# Patient Record
Sex: Female | Born: 1960 | Race: Black or African American | Hispanic: No | Smoking: Former smoker
Health system: Southern US, Community
[De-identification: ages and names within clinical notes are randomized; demographics above are authoritative.]

## PROBLEM LIST (undated history)

## (undated) DIAGNOSIS — N760 Acute vaginitis: Secondary | ICD-10-CM

## (undated) DIAGNOSIS — F32A Depression, unspecified: Secondary | ICD-10-CM

## (undated) DIAGNOSIS — K5900 Constipation, unspecified: Secondary | ICD-10-CM

## (undated) DIAGNOSIS — F419 Anxiety disorder, unspecified: Secondary | ICD-10-CM

## (undated) DIAGNOSIS — K219 Gastro-esophageal reflux disease without esophagitis: Secondary | ICD-10-CM

## (undated) DIAGNOSIS — M797 Fibromyalgia: Secondary | ICD-10-CM

## (undated) DIAGNOSIS — F329 Major depressive disorder, single episode, unspecified: Secondary | ICD-10-CM

## (undated) DIAGNOSIS — I10 Essential (primary) hypertension: Secondary | ICD-10-CM

## (undated) DIAGNOSIS — R011 Cardiac murmur, unspecified: Secondary | ICD-10-CM

## (undated) DIAGNOSIS — B369 Superficial mycosis, unspecified: Secondary | ICD-10-CM

## (undated) DIAGNOSIS — D649 Anemia, unspecified: Secondary | ICD-10-CM

## (undated) DIAGNOSIS — R102 Pelvic and perineal pain: Secondary | ICD-10-CM

## (undated) DIAGNOSIS — M1990 Unspecified osteoarthritis, unspecified site: Secondary | ICD-10-CM

## (undated) DIAGNOSIS — B9689 Other specified bacterial agents as the cause of diseases classified elsewhere: Secondary | ICD-10-CM

## (undated) DIAGNOSIS — N898 Other specified noninflammatory disorders of vagina: Principal | ICD-10-CM

## (undated) HISTORY — DX: Pelvic and perineal pain: R10.2

## (undated) HISTORY — DX: Other specified bacterial agents as the cause of diseases classified elsewhere: B96.89

## (undated) HISTORY — DX: Constipation, unspecified: K59.00

## (undated) HISTORY — DX: Other specified noninflammatory disorders of vagina: N89.8

## (undated) HISTORY — DX: Anemia, unspecified: D64.9

## (undated) HISTORY — DX: Superficial mycosis, unspecified: B36.9

## (undated) HISTORY — PX: TRIGGER FINGER RELEASE: SHX641

## (undated) HISTORY — DX: Fibromyalgia: M79.7

## (undated) HISTORY — DX: Acute vaginitis: N76.0

---

## 1994-07-23 HISTORY — PX: CYSTECTOMY: SUR359

## 1997-07-23 HISTORY — PX: KNEE SURGERY: SHX244

## 2005-12-08 ENCOUNTER — Emergency Department (HOSPITAL_COMMUNITY): Admission: EM | Admit: 2005-12-08 | Discharge: 2005-12-09 | Payer: Self-pay | Admitting: Emergency Medicine

## 2006-01-28 ENCOUNTER — Ambulatory Visit (HOSPITAL_COMMUNITY): Admission: RE | Admit: 2006-01-28 | Discharge: 2006-01-28 | Payer: Self-pay | Admitting: Family Medicine

## 2006-03-19 ENCOUNTER — Ambulatory Visit (HOSPITAL_COMMUNITY): Admission: RE | Admit: 2006-03-19 | Discharge: 2006-03-19 | Payer: Self-pay | Admitting: Family Medicine

## 2006-07-23 HISTORY — PX: ENDOMETRIAL ABLATION: SHX621

## 2006-08-17 ENCOUNTER — Emergency Department (HOSPITAL_COMMUNITY): Admission: EM | Admit: 2006-08-17 | Discharge: 2006-08-17 | Payer: Self-pay | Admitting: Emergency Medicine

## 2006-09-12 ENCOUNTER — Emergency Department (HOSPITAL_COMMUNITY): Admission: EM | Admit: 2006-09-12 | Discharge: 2006-09-12 | Payer: Self-pay | Admitting: Emergency Medicine

## 2006-10-03 ENCOUNTER — Ambulatory Visit (HOSPITAL_COMMUNITY): Admission: RE | Admit: 2006-10-03 | Discharge: 2006-10-03 | Payer: Self-pay | Admitting: Family Medicine

## 2006-10-05 ENCOUNTER — Emergency Department (HOSPITAL_COMMUNITY): Admission: EM | Admit: 2006-10-05 | Discharge: 2006-10-05 | Payer: Self-pay | Admitting: Emergency Medicine

## 2006-10-14 ENCOUNTER — Ambulatory Visit (HOSPITAL_COMMUNITY): Admission: RE | Admit: 2006-10-14 | Discharge: 2006-10-14 | Payer: Self-pay | Admitting: Obstetrics and Gynecology

## 2006-11-14 ENCOUNTER — Ambulatory Visit (HOSPITAL_COMMUNITY): Admission: RE | Admit: 2006-11-14 | Discharge: 2006-11-14 | Payer: Self-pay | Admitting: Obstetrics and Gynecology

## 2006-11-14 ENCOUNTER — Encounter (INDEPENDENT_AMBULATORY_CARE_PROVIDER_SITE_OTHER): Payer: Self-pay | Admitting: Specialist

## 2007-03-04 ENCOUNTER — Emergency Department (HOSPITAL_COMMUNITY): Admission: EM | Admit: 2007-03-04 | Discharge: 2007-03-04 | Payer: Self-pay | Admitting: Emergency Medicine

## 2007-03-25 ENCOUNTER — Other Ambulatory Visit: Admission: RE | Admit: 2007-03-25 | Discharge: 2007-03-25 | Payer: Self-pay | Admitting: Obstetrics and Gynecology

## 2007-03-26 ENCOUNTER — Encounter (HOSPITAL_COMMUNITY): Admission: RE | Admit: 2007-03-26 | Discharge: 2007-04-22 | Payer: Self-pay | Admitting: Orthopedic Surgery

## 2007-04-21 ENCOUNTER — Ambulatory Visit (HOSPITAL_COMMUNITY): Admission: RE | Admit: 2007-04-21 | Discharge: 2007-04-21 | Payer: Self-pay | Admitting: Family Medicine

## 2007-04-21 ENCOUNTER — Encounter (HOSPITAL_COMMUNITY)
Admission: RE | Admit: 2007-04-21 | Discharge: 2007-04-22 | Payer: Self-pay | Admitting: Physical Medicine and Rehabilitation

## 2007-04-25 ENCOUNTER — Encounter (HOSPITAL_COMMUNITY): Admission: RE | Admit: 2007-04-25 | Discharge: 2007-05-25 | Payer: Self-pay | Admitting: Family Medicine

## 2007-04-25 ENCOUNTER — Encounter (HOSPITAL_COMMUNITY)
Admission: RE | Admit: 2007-04-25 | Discharge: 2007-05-25 | Payer: Self-pay | Admitting: Physical Medicine and Rehabilitation

## 2007-05-05 ENCOUNTER — Emergency Department (HOSPITAL_COMMUNITY): Admission: EM | Admit: 2007-05-05 | Discharge: 2007-05-05 | Payer: Self-pay | Admitting: Emergency Medicine

## 2007-05-22 ENCOUNTER — Ambulatory Visit: Payer: Self-pay | Admitting: Physical Medicine & Rehabilitation

## 2007-05-27 ENCOUNTER — Encounter (HOSPITAL_COMMUNITY): Admission: RE | Admit: 2007-05-27 | Discharge: 2007-06-26 | Payer: Self-pay | Admitting: Family Medicine

## 2007-05-27 ENCOUNTER — Encounter (HOSPITAL_COMMUNITY)
Admission: RE | Admit: 2007-05-27 | Discharge: 2007-06-26 | Payer: Self-pay | Admitting: Physical Medicine and Rehabilitation

## 2007-05-30 ENCOUNTER — Ambulatory Visit (HOSPITAL_COMMUNITY): Admission: RE | Admit: 2007-05-30 | Discharge: 2007-05-30 | Payer: Self-pay | Admitting: Family Medicine

## 2007-06-11 ENCOUNTER — Ambulatory Visit (HOSPITAL_COMMUNITY): Admission: RE | Admit: 2007-06-11 | Discharge: 2007-06-11 | Payer: Self-pay | Admitting: Family Medicine

## 2007-07-03 ENCOUNTER — Encounter (HOSPITAL_COMMUNITY)
Admission: RE | Admit: 2007-07-03 | Discharge: 2007-07-23 | Payer: Self-pay | Admitting: Physical Medicine and Rehabilitation

## 2007-07-25 ENCOUNTER — Encounter (HOSPITAL_COMMUNITY)
Admission: RE | Admit: 2007-07-25 | Discharge: 2007-08-24 | Payer: Self-pay | Admitting: Physical Medicine and Rehabilitation

## 2007-10-07 ENCOUNTER — Emergency Department (HOSPITAL_COMMUNITY): Admission: EM | Admit: 2007-10-07 | Discharge: 2007-10-07 | Payer: Self-pay | Admitting: Emergency Medicine

## 2007-10-09 ENCOUNTER — Encounter (HOSPITAL_COMMUNITY): Admission: RE | Admit: 2007-10-09 | Discharge: 2007-11-08 | Payer: Self-pay | Admitting: Orthopedic Surgery

## 2007-10-09 ENCOUNTER — Ambulatory Visit: Payer: Self-pay | Admitting: Physical Medicine & Rehabilitation

## 2007-11-10 ENCOUNTER — Encounter (HOSPITAL_COMMUNITY): Admission: RE | Admit: 2007-11-10 | Discharge: 2007-12-10 | Payer: Self-pay | Admitting: Orthopedic Surgery

## 2007-12-11 ENCOUNTER — Encounter (HOSPITAL_COMMUNITY): Admission: RE | Admit: 2007-12-11 | Discharge: 2008-01-10 | Payer: Self-pay | Admitting: Orthopedic Surgery

## 2008-01-14 ENCOUNTER — Encounter (HOSPITAL_COMMUNITY): Admission: RE | Admit: 2008-01-14 | Discharge: 2008-02-13 | Payer: Self-pay | Admitting: Orthopedic Surgery

## 2008-04-29 ENCOUNTER — Other Ambulatory Visit: Admission: RE | Admit: 2008-04-29 | Discharge: 2008-04-29 | Payer: Self-pay | Admitting: Obstetrics & Gynecology

## 2008-07-23 HISTORY — PX: SHOULDER SURGERY: SHX246

## 2008-08-18 ENCOUNTER — Ambulatory Visit (HOSPITAL_COMMUNITY): Admission: RE | Admit: 2008-08-18 | Discharge: 2008-08-18 | Payer: Self-pay | Admitting: Family Medicine

## 2008-09-02 ENCOUNTER — Ambulatory Visit: Payer: Self-pay | Admitting: Cardiology

## 2008-09-03 ENCOUNTER — Observation Stay (HOSPITAL_COMMUNITY): Admission: EM | Admit: 2008-09-03 | Discharge: 2008-09-03 | Payer: Self-pay | Admitting: Cardiovascular Disease

## 2008-09-30 ENCOUNTER — Ambulatory Visit (HOSPITAL_COMMUNITY): Admission: RE | Admit: 2008-09-30 | Discharge: 2008-09-30 | Payer: Self-pay | Admitting: Family Medicine

## 2009-03-09 ENCOUNTER — Emergency Department (HOSPITAL_COMMUNITY): Admission: EM | Admit: 2009-03-09 | Discharge: 2009-03-09 | Payer: Self-pay | Admitting: Emergency Medicine

## 2009-05-08 ENCOUNTER — Emergency Department (HOSPITAL_COMMUNITY): Admission: EM | Admit: 2009-05-08 | Discharge: 2009-05-08 | Payer: Self-pay | Admitting: Emergency Medicine

## 2009-08-19 ENCOUNTER — Ambulatory Visit (HOSPITAL_COMMUNITY): Admission: RE | Admit: 2009-08-19 | Discharge: 2009-08-19 | Payer: Self-pay | Admitting: Family Medicine

## 2010-01-11 ENCOUNTER — Emergency Department (HOSPITAL_COMMUNITY): Admission: EM | Admit: 2010-01-11 | Discharge: 2010-01-11 | Payer: Self-pay | Admitting: Emergency Medicine

## 2010-06-04 ENCOUNTER — Emergency Department (HOSPITAL_COMMUNITY): Admission: EM | Admit: 2010-06-04 | Discharge: 2010-06-04 | Payer: Self-pay | Admitting: Emergency Medicine

## 2010-08-13 ENCOUNTER — Encounter: Payer: Self-pay | Admitting: Family Medicine

## 2010-09-03 ENCOUNTER — Emergency Department (HOSPITAL_COMMUNITY)
Admission: EM | Admit: 2010-09-03 | Discharge: 2010-09-03 | Disposition: A | Discharge status: Home / Self Care | Payer: Medicaid Other | Attending: Emergency Medicine | Admitting: Emergency Medicine

## 2010-09-03 ENCOUNTER — Emergency Department (HOSPITAL_COMMUNITY): Payer: Medicaid Other

## 2010-09-03 DIAGNOSIS — I10 Essential (primary) hypertension: Secondary | ICD-10-CM | POA: Insufficient documentation

## 2010-09-03 DIAGNOSIS — E785 Hyperlipidemia, unspecified: Secondary | ICD-10-CM | POA: Insufficient documentation

## 2010-09-03 DIAGNOSIS — IMO0002 Reserved for concepts with insufficient information to code with codable children: Secondary | ICD-10-CM | POA: Insufficient documentation

## 2010-09-03 DIAGNOSIS — M25519 Pain in unspecified shoulder: Secondary | ICD-10-CM | POA: Insufficient documentation

## 2010-09-03 DIAGNOSIS — E119 Type 2 diabetes mellitus without complications: Secondary | ICD-10-CM | POA: Insufficient documentation

## 2010-09-03 DIAGNOSIS — M79609 Pain in unspecified limb: Secondary | ICD-10-CM | POA: Insufficient documentation

## 2010-09-03 DIAGNOSIS — Z9889 Other specified postprocedural states: Secondary | ICD-10-CM | POA: Insufficient documentation

## 2010-09-18 ENCOUNTER — Emergency Department (HOSPITAL_COMMUNITY)
Admission: EM | Admit: 2010-09-18 | Discharge: 2010-09-19 | Disposition: A | Discharge status: Home / Self Care | Payer: Medicaid Other | Attending: Emergency Medicine | Admitting: Emergency Medicine

## 2010-09-18 DIAGNOSIS — E119 Type 2 diabetes mellitus without complications: Secondary | ICD-10-CM | POA: Insufficient documentation

## 2010-09-18 DIAGNOSIS — G8929 Other chronic pain: Secondary | ICD-10-CM | POA: Insufficient documentation

## 2010-09-18 DIAGNOSIS — Z794 Long term (current) use of insulin: Secondary | ICD-10-CM | POA: Insufficient documentation

## 2010-09-18 DIAGNOSIS — Z79899 Other long term (current) drug therapy: Secondary | ICD-10-CM | POA: Insufficient documentation

## 2010-09-18 DIAGNOSIS — I10 Essential (primary) hypertension: Secondary | ICD-10-CM | POA: Insufficient documentation

## 2010-09-18 DIAGNOSIS — M25519 Pain in unspecified shoulder: Secondary | ICD-10-CM | POA: Insufficient documentation

## 2010-09-20 ENCOUNTER — Other Ambulatory Visit (HOSPITAL_COMMUNITY): Payer: Self-pay | Admitting: Family Medicine

## 2010-09-20 DIAGNOSIS — Z139 Encounter for screening, unspecified: Secondary | ICD-10-CM

## 2010-09-29 ENCOUNTER — Ambulatory Visit (HOSPITAL_COMMUNITY)
Admission: RE | Admit: 2010-09-29 | Discharge: 2010-09-29 | Disposition: A | Discharge status: Home / Self Care | Payer: Medicaid Other | Source: Ambulatory Visit | Attending: Family Medicine | Admitting: Family Medicine

## 2010-09-29 DIAGNOSIS — Z1231 Encounter for screening mammogram for malignant neoplasm of breast: Secondary | ICD-10-CM | POA: Insufficient documentation

## 2010-09-29 DIAGNOSIS — Z139 Encounter for screening, unspecified: Secondary | ICD-10-CM

## 2010-10-03 LAB — POCT CARDIAC MARKERS
CKMB, poc: <1 ng/mL — ABNORMAL LOW (ref 1.0–8.0)
Myoglobin, poc: 56.6 ng/mL (ref 12–200)
Troponin i, poc: <0.05 ng/mL (ref 0.00–0.09)

## 2010-10-03 LAB — URINALYSIS, ROUTINE W REFLEX MICROSCOPIC
Bilirubin Urine: NEGATIVE
Nitrite: NEGATIVE
Protein, ur: NEGATIVE mg/dL
Specific Gravity, Urine: 1.015 (ref 1.005–1.030)
Urobilinogen, UA: 0.2 mg/dL (ref 0.0–1.0)

## 2010-10-03 LAB — CBC
MCH: 30.2 pg (ref 26.0–34.0)
MCHC: 33.2 g/dL (ref 30.0–36.0)
MCV: 91.1 fL (ref 78.0–100.0)
Platelets: 368 10*3/uL (ref 150–400)

## 2010-10-03 LAB — COMPREHENSIVE METABOLIC PANEL
AST: 12 U/L (ref 0–37)
Albumin: 3.7 g/dL (ref 3.5–5.2)
BUN: 8 mg/dL (ref 6–23)
CO2: 28 mEq/L (ref 19–32)
Calcium: 9.8 mg/dL (ref 8.4–10.5)
Creatinine, Ser: 0.67 mg/dL (ref 0.4–1.2)
GFR calc Af Amer: >60 mL/min (ref >60)
GFR calc non Af Amer: >60 mL/min (ref >60)
Total Bilirubin: 0.6 mg/dL (ref 0.3–1.2)

## 2010-10-03 LAB — LIPASE, BLOOD: Lipase: 16 U/L (ref 11–59)

## 2010-10-03 LAB — DIFFERENTIAL
Eosinophils Relative: 3 % (ref 0–5)
Lymphocytes Relative: 26 % (ref 12–46)
Lymphs Abs: 1.7 10*3/uL (ref 0.7–4.0)

## 2010-10-08 LAB — RAPID URINE DRUG SCREEN, HOSP PERFORMED
Amphetamines: NOT DETECTED
Barbiturates: NOT DETECTED
Benzodiazepines: POSITIVE — AB
Tetrahydrocannabinol: NOT DETECTED

## 2010-10-08 LAB — GLUCOSE, CAPILLARY
Glucose-Capillary: 161 mg/dL — ABNORMAL HIGH (ref 70–99)
Glucose-Capillary: 199 mg/dL — ABNORMAL HIGH (ref 70–99)
Glucose-Capillary: 263 mg/dL — ABNORMAL HIGH (ref 70–99)
Glucose-Capillary: 79 mg/dL (ref 70–99)

## 2010-10-08 LAB — DIFFERENTIAL
Basophils Relative: 0 % (ref 0–1)
Eosinophils Absolute: 0.2 10*3/uL (ref 0.0–0.7)
Eosinophils Relative: 3 % (ref 0–5)
Lymphs Abs: 1.6 10*3/uL (ref 0.7–4.0)
Monocytes Absolute: 0.5 10*3/uL (ref 0.1–1.0)
Monocytes Relative: 9 % (ref 3–12)
Neutrophils Relative %: 60 % (ref 43–77)

## 2010-10-08 LAB — URINALYSIS, ROUTINE W REFLEX MICROSCOPIC
Glucose, UA: >1000 mg/dL — AB
Leukocytes, UA: NEGATIVE
Nitrite: NEGATIVE
Specific Gravity, Urine: 1.01 (ref 1.005–1.030)
pH: 6 (ref 5.0–8.0)

## 2010-10-08 LAB — COMPREHENSIVE METABOLIC PANEL
ALT: 11 U/L (ref 0–35)
AST: 13 U/L (ref 0–37)
Alkaline Phosphatase: 86 U/L (ref 39–117)
Calcium: 8.9 mg/dL (ref 8.4–10.5)
GFR calc Af Amer: >60 mL/min (ref >60)
Glucose, Bld: 289 mg/dL — ABNORMAL HIGH (ref 70–99)
Potassium: 3.5 mEq/L (ref 3.5–5.1)
Sodium: 136 mEq/L (ref 135–145)
Total Protein: 6.4 g/dL (ref 6.0–8.3)

## 2010-10-08 LAB — CBC
HCT: 36.6 % (ref 36.0–46.0)
Hemoglobin: 12.3 g/dL (ref 12.0–15.0)
MCH: 30.9 pg (ref 26.0–34.0)
MCHC: 33.7 g/dL (ref 30.0–36.0)
RBC: 3.99 MIL/uL (ref 3.87–5.11)

## 2010-10-08 LAB — URINE CULTURE

## 2010-10-08 LAB — URINE MICROSCOPIC-ADD ON

## 2010-10-08 LAB — CK TOTAL AND CKMB (NOT AT ARMC): Total CK: 61 U/L (ref 7–177)

## 2010-10-19 ENCOUNTER — Ambulatory Visit (HOSPITAL_COMMUNITY): Payer: Medicaid Other | Admitting: Specialist

## 2010-10-24 ENCOUNTER — Ambulatory Visit (HOSPITAL_COMMUNITY)
Admission: RE | Admit: 2010-10-24 | Discharge: 2010-10-24 | Disposition: A | Discharge status: Home / Self Care | Payer: Medicaid Other | Source: Ambulatory Visit | Attending: Physical Medicine and Rehabilitation | Admitting: Physical Medicine and Rehabilitation

## 2010-10-24 DIAGNOSIS — M6281 Muscle weakness (generalized): Secondary | ICD-10-CM | POA: Insufficient documentation

## 2010-10-24 DIAGNOSIS — M25519 Pain in unspecified shoulder: Secondary | ICD-10-CM | POA: Insufficient documentation

## 2010-10-24 DIAGNOSIS — IMO0001 Reserved for inherently not codable concepts without codable children: Secondary | ICD-10-CM | POA: Insufficient documentation

## 2010-10-26 LAB — COMPREHENSIVE METABOLIC PANEL WITH GFR
AST: 14 U/L (ref 0–37)
Albumin: 3.7 g/dL (ref 3.5–5.2)
Calcium: 8.6 mg/dL (ref 8.4–10.5)
Chloride: 104 meq/L (ref 96–112)
Creatinine, Ser: 0.59 mg/dL (ref 0.4–1.2)
GFR calc Af Amer: >60 mL/min (ref >60)
Total Bilirubin: 0.6 mg/dL (ref 0.3–1.2)
Total Protein: 6.5 g/dL (ref 6.0–8.3)

## 2010-10-26 LAB — COMPREHENSIVE METABOLIC PANEL
ALT: 13 U/L (ref 0–35)
Alkaline Phosphatase: 76 U/L (ref 39–117)
BUN: 8 mg/dL (ref 6–23)
CO2: 26 mEq/L (ref 19–32)
GFR calc non Af Amer: >60 mL/min (ref >60)
Glucose, Bld: 300 mg/dL — ABNORMAL HIGH (ref 70–99)
Potassium: 4 mEq/L (ref 3.5–5.1)
Sodium: 136 mEq/L (ref 135–145)

## 2010-10-26 LAB — DIFFERENTIAL
Basophils Absolute: 0 10*3/uL (ref 0.0–0.1)
Basophils Relative: 0 % (ref 0–1)
Eosinophils Absolute: 0.1 10*3/uL (ref 0.0–0.7)
Eosinophils Relative: 2 % (ref 0–5)
Lymphocytes Relative: 22 % (ref 12–46)
Lymphs Abs: 1.4 K/uL (ref 0.7–4.0)
Monocytes Absolute: 0.5 K/uL (ref 0.1–1.0)
Monocytes Relative: 7 % (ref 3–12)
Neutro Abs: 4.6 10*3/uL (ref 1.7–7.7)
Neutrophils Relative %: 69 % (ref 43–77)

## 2010-10-26 LAB — CBC
HCT: 35.6 % — ABNORMAL LOW (ref 36.0–46.0)
Hemoglobin: 12.1 g/dL (ref 12.0–15.0)
MCHC: 34.1 g/dL (ref 30.0–36.0)
MCV: 91.7 fL (ref 78.0–100.0)
Platelets: 363 K/uL (ref 150–400)
RBC: 3.88 MIL/uL (ref 3.87–5.11)
RDW: 13.8 % (ref 11.5–15.5)
WBC: 6.7 K/uL (ref 4.0–10.5)

## 2010-10-26 LAB — URINALYSIS, ROUTINE W REFLEX MICROSCOPIC
Bilirubin Urine: NEGATIVE
Hgb urine dipstick: NEGATIVE
Ketones, ur: NEGATIVE mg/dL
Nitrite: NEGATIVE
Specific Gravity, Urine: <1.005 — ABNORMAL LOW (ref 1.005–1.030)
Urobilinogen, UA: 0.2 mg/dL (ref 0.0–1.0)

## 2010-10-26 LAB — GLUCOSE, CAPILLARY
Glucose-Capillary: 200 mg/dL — ABNORMAL HIGH (ref 70–99)
Glucose-Capillary: 317 mg/dL — ABNORMAL HIGH (ref 70–99)

## 2010-10-26 LAB — LIPASE, BLOOD: Lipase: 14 U/L (ref 11–59)

## 2010-10-26 LAB — URINE MICROSCOPIC-ADD ON

## 2010-10-31 ENCOUNTER — Ambulatory Visit (HOSPITAL_COMMUNITY): Payer: Medicaid Other | Admitting: Occupational Therapy

## 2010-11-02 ENCOUNTER — Ambulatory Visit (HOSPITAL_COMMUNITY): Admission: RE | Admit: 2010-11-02 | Payer: Medicaid Other | Source: Ambulatory Visit | Admitting: Specialist

## 2010-11-07 ENCOUNTER — Ambulatory Visit (HOSPITAL_COMMUNITY): Payer: Medicaid Other | Admitting: Occupational Therapy

## 2010-11-07 LAB — CARDIAC PANEL(CRET KIN+CKTOT+MB+TROPI)
Relative Index: INVALID (ref 0.0–2.5)
Troponin I: <0.01 ng/mL (ref 0.00–0.06)

## 2010-11-07 LAB — DIFFERENTIAL
Basophils Absolute: 0 10*3/uL (ref 0.0–0.1)
Lymphocytes Relative: 25 % (ref 12–46)
Monocytes Absolute: 0.4 10*3/uL (ref 0.1–1.0)
Neutro Abs: 4 10*3/uL (ref 1.7–7.7)
Neutrophils Relative %: 65 % (ref 43–77)

## 2010-11-07 LAB — HEPATIC FUNCTION PANEL
ALT: 14 U/L (ref 0–35)
AST: 15 U/L (ref 0–37)
Alkaline Phosphatase: 70 U/L (ref 39–117)
Bilirubin, Direct: 0.1 mg/dL (ref 0.0–0.3)
Indirect Bilirubin: 0.6 mg/dL (ref 0.3–0.9)
Total Bilirubin: 0.7 mg/dL (ref 0.3–1.2)

## 2010-11-07 LAB — POCT CARDIAC MARKERS
CKMB, poc: 1.1 ng/mL (ref 1.0–8.0)
CKMB, poc: <1 ng/mL — ABNORMAL LOW (ref 1.0–8.0)
Myoglobin, poc: 28.6 ng/mL (ref 12–200)
Myoglobin, poc: 29.9 ng/mL (ref 12–200)
Myoglobin, poc: 39.8 ng/mL (ref 12–200)
Troponin i, poc: <0.05 ng/mL (ref 0.00–0.09)

## 2010-11-07 LAB — BASIC METABOLIC PANEL
Calcium: 9 mg/dL (ref 8.4–10.5)
GFR calc Af Amer: >60 mL/min (ref >60)
GFR calc non Af Amer: >60 mL/min (ref >60)
Sodium: 133 mEq/L — ABNORMAL LOW (ref 135–145)

## 2010-11-07 LAB — CBC
Hemoglobin: 12.1 g/dL (ref 12.0–15.0)
RBC: 3.88 MIL/uL (ref 3.87–5.11)
RDW: 12.8 % (ref 11.5–15.5)

## 2010-11-07 LAB — APTT: aPTT: 29 seconds (ref 24–37)

## 2010-11-07 LAB — PROTIME-INR: INR: 0.9 (ref 0.00–1.49)

## 2010-11-07 LAB — GLUCOSE, CAPILLARY
Glucose-Capillary: 206 mg/dL — ABNORMAL HIGH (ref 70–99)
Glucose-Capillary: 66 mg/dL — ABNORMAL LOW (ref 70–99)

## 2010-11-07 LAB — LIPID PANEL
Cholesterol: 162 mg/dL (ref 0–200)
LDL Cholesterol: 88 mg/dL (ref 0–99)
Total CHOL/HDL Ratio: 2.7 RATIO

## 2010-11-14 ENCOUNTER — Ambulatory Visit (HOSPITAL_COMMUNITY)
Admission: RE | Admit: 2010-11-14 | Discharge: 2010-11-14 | Disposition: A | Discharge status: Home / Self Care | Payer: Medicaid Other | Source: Ambulatory Visit | Attending: Family Medicine | Admitting: Family Medicine

## 2010-11-16 ENCOUNTER — Ambulatory Visit (HOSPITAL_COMMUNITY): Payer: Medicaid Other

## 2010-11-23 ENCOUNTER — Other Ambulatory Visit: Payer: Self-pay | Admitting: Obstetrics & Gynecology

## 2010-11-23 ENCOUNTER — Other Ambulatory Visit (HOSPITAL_COMMUNITY)
Admission: RE | Admit: 2010-11-23 | Discharge: 2010-11-23 | Disposition: A | Discharge status: Home / Self Care | Payer: Medicaid Other | Source: Ambulatory Visit | Attending: Obstetrics & Gynecology | Admitting: Obstetrics & Gynecology

## 2010-11-23 DIAGNOSIS — Z01419 Encounter for gynecological examination (general) (routine) without abnormal findings: Secondary | ICD-10-CM | POA: Insufficient documentation
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
## 2010-12-05 NOTE — Discharge Summary (Signed)
NAMELACE, CHENEVERT           ACCOUNT NO.:  [id number]   MEDICAL RECORD NO.:  [id number]          PATIENT TYPE:  INP   LOCATION:  2007                         FACILITY:  [facility]   PHYSICIAN:  Madolyn Frieze. Jens Som, MD, FACCDATE OF BIRTH:  [birth year]   DATE OF ADMISSION:  09/03/2008  DATE OF DISCHARGE:  09/03/2008                               DISCHARGE SUMMARY   PRIMARY CARE PHYSICIAN:  Dr. Phillips Odor.   PRIMARY CARDIOLOGIST:  Madolyn Frieze. Jens Som, MD, [facility]   PROCEDURES PERFORMED DURING HOSPITALIZATION:  Stress Myoview dated  September 03, 2008, revealing normal left ventricular systolic function  and wall motion abnormality with no evidence for ischemia.  There is a  fixed defect at the apex, which may represent apical thinning.  Wall  motion appears normal at the apex and apical infarction seems less  likely read by Dr. Marca Ancona.   FINAL DISCHARGE DIAGNOSIS:  1. Noncardiac chest pain.  2. Diabetes, not well controlled.  3. History of hyperlipidemia.  4. History of degenerative joint disease.   HOSPITAL COURSE:  This is a 50 year old female with history of diabetes,  not well controlled with blood sugars ranging in the high 300s usually.  The patient had complaints of left-sided precordial discomfort of the  chest radiating to the left arm progressing to the point where it was  severe associated with nausea lasting over 2 hours.  The patient was  seen in the emergency room at [facility] where she was given  aspirin and sublingual nitroglycerin.  The patient states that the  symptoms improved with belching and she thought it was related to gas.  The patient was transferred from [facility] to [facility] where she was seen by Dr. Rachael Fee D. Prime, Cardiology fellow  for Dr. Jens Som.  The patient was admitted to rule out myocardial  infarction and with serial enzymes.  She was also started on a sliding  scale insulin secondary to poor blood sugar control  and placed on a  heparin drip.   The patient was seen and examined by Dr. Olga Millers the following  morning.  The chest pain continued the following morning.  EKG showed no  ST-T changes.  Her cardiac enzymes were found to be negative throughout  hospitalization.  The patient subsequently had stress Myoview completed  with results as described above.  Heparin was subsequently discontinued  and the patient was found to be stable for discharge.  The patient's  blood glucose remained elevated and will need to be followed as an  outpatient by primary care physician for better control.  The patient's  hemoglobin A1c was found to be very elevated at 9.7.  The patient was on  Humulin 70/30, 10 units three times a day with meals along with  Glucophage; we have asked her to continue this.  There is no note  stating that the patient had been noncompliant; however, her blood  glucose was better controlled during the hospitalization.  The patient  was found to be stable and will be discharged with followup with her  primary care physician.  This will need  to be completed within the next  several days as she will need to have better blood glucose control.   DISCHARGE LABORATORIES:  LFTs were found to be normal.  Cardiac enzymes  were negative at 0.01, 0.01, and 0.01 respectively.  Cholesterol 162,  lipids 70, HDL 60, LDL 88.  Sodium 133, potassium of 3.8, chloride 101,  CO2 of 28, blood glucose 298 on admission, BUN 12, creatinine of 0.65.  D-dimer 0.300.  Hemoglobin 12.1, hematocrit 36.0, white blood cells 6.2,  and platelets 340.   DISCHARGE MEDICATIONS:  1. Lantus insulin 40 units at bedtime, 10 units t.i.d. with meals.  2. Mobic 50 mg daily.  3. Flexeril 10 mg t.i.d. p.r.n.  4. Simvastatin 20 mg at bedtime.  5. Glucophage 1000 mg p.o. b.i.d.   ALLERGIES:  No known drug allergies.   FOLLOWUP PLANS AND APPOINTMENT:  The patient will follow up with Dr.  Phillips Odor, primary care physician.   We have advised her to see her  physician the first of the week for reevaluation for blood glucose for  better blood glucose control.  The patient has also been advised to be  on a diabetic diet and compliance with this along with her insulin  regimen.  The patient has been advised to check blood glucose a.c. and  at bedtime and record and bring to primary care physician's office.  The  patient has no need for further cardiac workup at this time unless the  discomfort returned and she has positive cardiac enzymes at which time  the patient will be scheduled for cardiac catheterization.  We will see  the patient on a p.r.n. basis at Dr. Lamar Blinks discretion.      Bettey Mare. Lyman Bishop, NP      Madolyn Frieze. Jens Som, MD, [facility]  Electronically Signed    KML/MEDQ  D:  09/03/2008  T:  09/04/2008  Job:  [id number]   cc:   Dr. Phillips Odor

## 2010-12-05 NOTE — H&P (Signed)
[name], CURE           ACCOUNT NO.:  [id number]   MEDICAL RECORD NO.:  [id number]          PATIENT TYPE:  INP   LOCATION:  2007                         FACILITY:  [facility]   PHYSICIAN:  Darryl D. Prime, MD    DATE OF BIRTH:  05-21-1961   DATE OF ADMISSION:  09/03/2008  DATE OF DISCHARGE:                              HISTORY & PHYSICAL   The patient is full code.   PRIMARY CARE PHYSICIAN:  Dr. Phillips Odor, [facility] in [place].   CHIEF COMPLAINT:  Chest pain.   He was transferred from [facility] ER.   HISTORY OF PRESENT ILLNESS:  [name] is a 50 year old female with a  history of diabetes for the last 10 years, poorly controlled, blood  sugars in the range of 300s per patient usually.  Hemoglobin A1c in  April 2008, was 9.7.  She notes chest pain for the last week at rest.  The patient notes she has had chest pain everyday.  Initially, it comes  on gradually, left side precordial with radiation to the left arm and  then progresses to the point where it is severe, associated with sweats  and nausea, lasting on average 2 hours.  The patient notes multiple  episodes on the day prior to admission through the day of admission, and  so severe to the point where she decided to be evaluated in the  emergency room.  In the emergency room there, she was given aspirin and  sublingual nitroglycerin.  The patient notes symptoms improved with  belching and she thought this was related to gas.  She also notes that  the nitroglycerin helped her pain significantly as well.   PAST MEDICAL/SURGICAL HISTORY:  1. History of diabetes as above.  2. History of knee surgery on the right, she has two pins.  3. History of rotator cuff surgery repair on the right.  4. History of hyperlipidemia.  5. History of degenerative joint disease.   ALLERGIES:  NO KNOWN DRUG ALLERGIES.   MEDICATIONS:  1. Humulin 70/30 subcu 10 units three times a day with meals.  2. Glucophage 1000 mg twice a  day.  3. Lyrica 20 mg daily.  4. Flexeril 10 mg p.r.n.  5. Mobic 15 mg p.r.n.  6. Lantus subcu 40 units at bedtime.  7. Zocor 20 mg daily.   SOCIAL HISTORY:  History of tobacco abuse, 20 years worth, 1/2 pack per  day, but discontinued 17 years ago.  Rare alcohol.  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   FAMILY HISTORY:  The patient denies any history of premature coronary  artery disease in the family.  Mother has hypertension.  She has two  brothers with diabetes.   REVIEW OF SYSTEMS:  A 14-point review of systems negative unless stated  above.   PHYSICAL EXAMINATION:  VITAL SIGNS:  Blood pressure is 132/90 here,  pulse 70, respiratory rate 18, temperature 98.3.  GENERAL:  She is a mildly obese female lying flat in bed in no acute  distress.  HEENT:  Normocephalic, atraumatic.  Pupils equal, round and reactive to  light  with extraocular movements being intact.  The oropharynx is moist.  NECK:  Supple with no lymphadenopathy or thyromegaly.  No carotid  bruits.  LUNGS:  Clear to auscultation bilaterally.  CARDIOVASCULAR:  Regular rhythm and rate with no murmurs, rubs or  gallops.  Normal S1-S2.  No S3 or S4.  EXTREMITIES:  Showed no clubbing, cyanosis or edema.  NEUROLOGIC:  She is alert and oriented x4 with cranial nerves II through  XII grossly intact.  Strength and sensation are grossly intact.  ABDOMEN:  Soft, nontender, nondistended without hepatosplenomegaly.   LABORATORY DATA:  Cardiac markers drawn on September 02, 2008, at 2310  and 2347 were negative, as well as cardiac markers drawn September 03, 2008, at around 1 o'clock where they were also unremarkable.  White  count was 6.2 with hemoglobin 12.1, hematocrit 36, platelets 340 with  segs of 65%.  Sodium 133, potassium 3.8, chloride 101, bicarb 28, BUN  12, creatinine 0.65, glucose 298, D-dimer 0.3.  Chest x-ray showed no  acute cardiopulmonary disease.  EKG showed normal sinus rhythm with  a  vent rate of 70 beats per minute with a first degree AV block, PR  interval 222, QRS 82, QTc 440.  No major change in terms of ST-T  segments on November 14, 2006.   ASSESSMENT/PLAN:  This is a patient with a history of diabetes who  presents with possible unstable angina.  Given her poor blood sugar  control she is at risk for significant cardiovascular disease.  The  patient will be admitted to telemetry and we will give her an aspirin,  low-dose beta-blockers and p.r.n. nitrates.  Will increase her Zocor and  will check a fasting lipid panel.  She will held n.p.o. for possible  ischemic evaluation.  She will be placed on a heparin drip.  We will  follow her blood sugars closely.      Darryl D. Prime, MD  Electronically Signed     DDP/MEDQ  D:  09/03/2008  T:  09/03/2008  Job:  [id number]
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
## 2010-12-08 NOTE — Op Note (Signed)
[name], [name]        ACCOUNT NO.:  [id number]   MEDICAL RECORD NO.:  [id number]          PATIENT TYPE:  AMB   LOCATION:  DAY                           FACILITY:  [facility]   PHYSICIAN:  Tilda Burrow, M.D. DATE OF BIRTH:  09-Jun-1961   DATE OF PROCEDURE:  11/14/2006  DATE OF DISCHARGE:                               OPERATIVE REPORT   PREOPERATIVE DIAGNOSIS:  1. Menorrhagia with anemia.  2. Diabetes mellitus.   POSTOPERATIVE DIAGNOSIS:  1. Menorrhagia with anemia.  2. Diabetes mellitus.   PROCEDURE:  Hysteroscopy, dilatation and curettage, endometrial  ablation.   SURGEON:  Tilda Burrow, M.D.   ASSISTANT:  Trenton Founds, CSC   ANESTHESIA:  General with LMA.   COMPLICATIONS:  None.   FINDINGS:  Uterus sounded to 11 cm.  Smooth endometrial contours with no  thickening, only diffusely enlarged uterus.   DETAILS OF PROCEDURE:  The patient was taken to the operating room,  prepped and draped for a vaginal procedure with paracervical block  applied and the cervix grasped with a single tooth tenaculum.  The  uterus was sounded to 11 cm and dilated to 29 [place] allowing  introduction of a rigid 30 degree hysteroscope for endometrial  evaluation.  The hysteroscope identified the endometrial cavity as being  smooth without abnormalities.  The tubal ostia could be visualized  bilaterally.  Gynecare ThermaChoice III endometrial ablation sequence  was then initiated and completed with 22 mL of saline used with a total  procedure time of 9 minutes 59 seconds including heating and cold time.  The 22 mL of fluid were all completely recovered.  The patient was able  to go to recovery in stable condition.   ADDENDUM:  A brief curettage was done obtaining almost no tissue.  The  patient had been on Megace prior to the procedure since her last menses.   The patient's recent admission labs are of note with hemoglobin A1c of  9.4, random blood glucose 290, BUN 9, creatinine  0.65, hemoglobin 10.6,  hematocrit 31.3.  Preoperative EKG shows sinus rhythm with sinus  arrhythmia notable as first degree AV block, otherwise, normal.   The patient is encouraged to follow up with Dr. Phillips Odor regarding  improved diabetic control.   CURRENT MEDICATIONS:  Humulin 70/30, 40 units q.a.m., 20 units q.h.s.,  Glucophage 1000 mg b.i.d., Megace p.o. daily, which will be  discontinued, she is not currently on any lipid medications.      Tilda Burrow, M.D.  Electronically Signed     JVF/MEDQ  D:  11/14/2006  T:  11/14/2006  Job:  [id number]

## 2011-01-27 ENCOUNTER — Encounter: Payer: Self-pay | Admitting: Emergency Medicine

## 2011-01-27 ENCOUNTER — Emergency Department (HOSPITAL_COMMUNITY)
Admission: EM | Admit: 2011-01-27 | Discharge: 2011-01-27 | Disposition: A | Discharge status: Home / Self Care | Payer: Medicaid Other | Attending: Emergency Medicine | Admitting: Emergency Medicine

## 2011-01-27 ENCOUNTER — Emergency Department (HOSPITAL_COMMUNITY): Payer: Medicaid Other

## 2011-01-27 DIAGNOSIS — Z79899 Other long term (current) drug therapy: Secondary | ICD-10-CM | POA: Insufficient documentation

## 2011-01-27 DIAGNOSIS — F3289 Other specified depressive episodes: Secondary | ICD-10-CM | POA: Insufficient documentation

## 2011-01-27 DIAGNOSIS — H8110 Benign paroxysmal vertigo, unspecified ear: Secondary | ICD-10-CM

## 2011-01-27 DIAGNOSIS — E119 Type 2 diabetes mellitus without complications: Secondary | ICD-10-CM | POA: Insufficient documentation

## 2011-01-27 DIAGNOSIS — R42 Dizziness and giddiness: Secondary | ICD-10-CM | POA: Insufficient documentation

## 2011-01-27 DIAGNOSIS — Z87891 Personal history of nicotine dependence: Secondary | ICD-10-CM | POA: Insufficient documentation

## 2011-01-27 DIAGNOSIS — R112 Nausea with vomiting, unspecified: Secondary | ICD-10-CM | POA: Insufficient documentation

## 2011-01-27 DIAGNOSIS — F329 Major depressive disorder, single episode, unspecified: Secondary | ICD-10-CM | POA: Insufficient documentation

## 2011-01-27 DIAGNOSIS — F411 Generalized anxiety disorder: Secondary | ICD-10-CM | POA: Insufficient documentation

## 2011-01-27 HISTORY — DX: Depression, unspecified: F32.A

## 2011-01-27 HISTORY — DX: Major depressive disorder, single episode, unspecified: F32.9

## 2011-01-27 LAB — URINALYSIS, ROUTINE W REFLEX MICROSCOPIC
Glucose, UA: >1000 mg/dL — AB
Ketones, ur: >80 mg/dL — AB
Leukocytes, UA: NEGATIVE
Nitrite: NEGATIVE
Protein, ur: NEGATIVE mg/dL
pH: 6 (ref 5.0–8.0)

## 2011-01-27 LAB — CBC
HCT: 37.4 % (ref 36.0–46.0)
Hemoglobin: 12.7 g/dL (ref 12.0–15.0)
MCHC: 34 g/dL (ref 30.0–36.0)
MCV: 89 fL (ref 78.0–100.0)
RDW: 12.3 % (ref 11.5–15.5)

## 2011-01-27 LAB — URINE MICROSCOPIC-ADD ON

## 2011-01-27 LAB — COMPREHENSIVE METABOLIC PANEL
Albumin: 3.9 g/dL (ref 3.5–5.2)
Alkaline Phosphatase: 104 U/L (ref 39–117)
BUN: 29 mg/dL — ABNORMAL HIGH (ref 6–23)
Creatinine, Ser: 1.06 mg/dL (ref 0.50–1.10)
GFR calc Af Amer: >60 mL/min (ref >60)
Glucose, Bld: 371 mg/dL — ABNORMAL HIGH (ref 70–99)
Potassium: 3.8 mEq/L (ref 3.5–5.1)
Total Bilirubin: 0.3 mg/dL (ref 0.3–1.2)
Total Protein: 7.2 g/dL (ref 6.0–8.3)

## 2011-01-27 MED ORDER — INSULIN REGULAR HUMAN 100 UNIT/ML IJ SOLN
4.0000 [IU] | Freq: Once | INTRAMUSCULAR | Status: AC
  Administered 2011-01-27: 4 [IU] via SUBCUTANEOUS

## 2011-01-27 MED ORDER — SODIUM CHLORIDE 0.9 % IV SOLN
4.0000 mg | Freq: Once | INTRAVENOUS | Status: AC
  Administered 2011-01-27: 4 mg via INTRAVENOUS
  Filled 2011-01-27: qty 2

## 2011-01-27 MED ORDER — MECLIZINE HCL 12.5 MG PO TABS
25.0000 mg | ORAL_TABLET | Freq: Once | ORAL | Status: AC
  Administered 2011-01-27: 25 mg via ORAL
  Filled 2011-01-27: qty 2

## 2011-01-27 MED ORDER — SODIUM CHLORIDE 0.9 % IV SOLN
Freq: Once | INTRAVENOUS | Status: AC
  Administered 2011-01-27: 16:00:00 via INTRAVENOUS

## 2011-01-27 NOTE — ED Provider Notes (Signed)
History     Chief Complaint  Patient presents with  . Dizziness   Patient is a 50 y.o. female presenting with neurologic complaint.  Neurologic Problem The primary symptoms include dizziness, nausea and vomiting. Primary symptoms do not include headaches, loss of consciousness, altered mental status, seizures, visual change, focal weakness, loss of sensation, memory loss or fever. Primary symptoms comment: Pt is complaining of dizziness and nausea. The symptoms began yesterday.  She describes the dizziness as a sensation of spinning. The dizziness began yesterday. The dizziness has been gradually worsening since its onset. The dizziness is associated with emotional stress. Dizziness also occurs with nausea, vomiting and weakness. Dizziness does not occur with tinnitus.  Additional symptoms include weakness, vertigo and anxiety. Additional symptoms do not include neck stiffness, pain or tinnitus. Associated symptoms comments: Pt has also been going through depression due to infidelity of her husband and some issues with her daughter.  PT has been drinking a lot of alcohol and wine.  Pt has been losing weight and not eating as much.. Medical issues also include alcohol use. Medical issues do not include seizures.    Past Medical History  Diagnosis Date  . Diabetes mellitus   . Depression     Past Surgical History  Procedure Date  . Knee surgery   . Cystectomy     History reviewed. No pertinent family history.  History  Substance Use Topics  . Smoking status: Former Games developer  . Smokeless tobacco: Not on file  . Alcohol Use:      bottle of wine everynight x 1-2 months    OB History    Grav Para Term Preterm Abortions TAB SAB Ect Mult Living                  Review of Systems  Constitutional: Negative for fever.  HENT: Negative for neck stiffness and tinnitus.   Gastrointestinal: Positive for nausea and vomiting.  Neurological: Positive for dizziness, vertigo and weakness.  Negative for focal weakness, seizures, loss of consciousness and headaches.  Psychiatric/Behavioral: Negative for memory loss and altered mental status.  All other systems reviewed and are negative.    Physical Exam  BP 112/69  Pulse 83  Temp(Src) 98.9 F (37.2 C) (Oral)  Resp 17  SpO2 99%  Physical Exam  Constitutional: She is oriented to person, place, and time. She appears well-developed and well-nourished. No distress.  HENT:  Head: Normocephalic and atraumatic.  Right Ear: External ear normal.  Left Ear: External ear normal.  Eyes: Conjunctivae are normal. Right eye exhibits no discharge. Left eye exhibits no discharge. No scleral icterus.  Neck: Neck supple. No tracheal deviation present.  Cardiovascular: Normal rate, regular rhythm and intact distal pulses.   Pulmonary/Chest: Effort normal and breath sounds normal. No stridor. No respiratory distress. She has no wheezes. She has no rales.  Abdominal: Soft. Bowel sounds are normal. She exhibits no distension. There is no tenderness. There is no rebound and no guarding.  Musculoskeletal: She exhibits no edema and no tenderness.  Neurological: She is alert and oriented to person, place, and time. She has normal strength. She displays no atrophy and no tremor. No cranial nerve deficit ( no gross defecits noted) or sensory deficit. She exhibits normal muscle tone. She displays no seizure activity. Coordination normal. GCS eye subscore is 4. GCS verbal subscore is 5. GCS motor subscore is 6.       5/5 upper and lower extrem strength, nl finger to nose,  Skin: Skin is warm and dry. No rash noted.  Psychiatric: She has a normal mood and affect.    ED Course  Procedures Reviewed labs and CT scans.  MDM Pt without signs of anemia, no significant dehydration.  Hyperglycemia but no sign of dka.  Pt given IV fluids in the ED and meclizine.  Suspect peripheral vertigo.   With regards to her depression.  NO si or hi.  Does have follow  up      Celene Kras, MD 01/27/11 1736

## 2011-01-27 NOTE — ED Notes (Signed)
Pt  C/o dizziness and weakness all over since yesterday. Home cbg 146 today at lunch. Nausea. NAD.

## 2011-01-27 NOTE — ED Notes (Signed)
Pt has also stated her daughter recently "came out of the closet" about being a lesbian and she is very upset about that.

## 2011-03-30 ENCOUNTER — Other Ambulatory Visit (HOSPITAL_COMMUNITY): Payer: Self-pay | Admitting: Specialist

## 2011-03-30 DIAGNOSIS — M549 Dorsalgia, unspecified: Secondary | ICD-10-CM

## 2011-04-03 ENCOUNTER — Ambulatory Visit (HOSPITAL_COMMUNITY)
Admission: RE | Admit: 2011-04-03 | Discharge: 2011-04-03 | Disposition: A | Discharge status: Home / Self Care | Payer: Medicaid Other | Source: Ambulatory Visit | Attending: Specialist | Admitting: Specialist

## 2011-04-03 DIAGNOSIS — M545 Low back pain, unspecified: Secondary | ICD-10-CM | POA: Insufficient documentation

## 2011-04-03 DIAGNOSIS — M25559 Pain in unspecified hip: Secondary | ICD-10-CM | POA: Insufficient documentation

## 2011-04-03 DIAGNOSIS — M549 Dorsalgia, unspecified: Secondary | ICD-10-CM

## 2011-04-03 DIAGNOSIS — D259 Leiomyoma of uterus, unspecified: Secondary | ICD-10-CM | POA: Insufficient documentation

## 2011-04-03 DIAGNOSIS — M5137 Other intervertebral disc degeneration, lumbosacral region: Secondary | ICD-10-CM | POA: Insufficient documentation

## 2011-04-03 DIAGNOSIS — M51379 Other intervertebral disc degeneration, lumbosacral region without mention of lumbar back pain or lower extremity pain: Secondary | ICD-10-CM | POA: Insufficient documentation

## 2011-04-16 LAB — CBC
HCT: 32.4 — ABNORMAL LOW
Hemoglobin: 11.1 — ABNORMAL LOW
MCV: 91
RDW: 13.3

## 2011-04-16 LAB — DIFFERENTIAL
Basophils Absolute: 0
Eosinophils Absolute: 0
Eosinophils Relative: 0
Lymphocytes Relative: 17
Monocytes Absolute: 0.9

## 2011-04-29 ENCOUNTER — Encounter (HOSPITAL_COMMUNITY): Payer: Self-pay | Admitting: Emergency Medicine

## 2011-04-29 ENCOUNTER — Emergency Department (HOSPITAL_COMMUNITY)
Admission: EM | Admit: 2011-04-29 | Discharge: 2011-04-29 | Disposition: A | Discharge status: Home / Self Care | Payer: Medicaid Other | Attending: Emergency Medicine | Admitting: Emergency Medicine

## 2011-04-29 ENCOUNTER — Emergency Department (HOSPITAL_COMMUNITY): Payer: Medicaid Other

## 2011-04-29 DIAGNOSIS — F17200 Nicotine dependence, unspecified, uncomplicated: Secondary | ICD-10-CM | POA: Insufficient documentation

## 2011-04-29 DIAGNOSIS — N898 Other specified noninflammatory disorders of vagina: Secondary | ICD-10-CM | POA: Insufficient documentation

## 2011-04-29 DIAGNOSIS — R112 Nausea with vomiting, unspecified: Secondary | ICD-10-CM | POA: Insufficient documentation

## 2011-04-29 DIAGNOSIS — Z79899 Other long term (current) drug therapy: Secondary | ICD-10-CM | POA: Insufficient documentation

## 2011-04-29 DIAGNOSIS — E119 Type 2 diabetes mellitus without complications: Secondary | ICD-10-CM | POA: Insufficient documentation

## 2011-04-29 DIAGNOSIS — R1084 Generalized abdominal pain: Secondary | ICD-10-CM | POA: Insufficient documentation

## 2011-04-29 DIAGNOSIS — K2990 Gastroduodenitis, unspecified, without bleeding: Secondary | ICD-10-CM | POA: Insufficient documentation

## 2011-04-29 DIAGNOSIS — K2970 Gastritis, unspecified, without bleeding: Secondary | ICD-10-CM | POA: Insufficient documentation

## 2011-04-29 LAB — URINALYSIS, ROUTINE W REFLEX MICROSCOPIC
Ketones, ur: >80 mg/dL — AB
Leukocytes, UA: NEGATIVE
Nitrite: NEGATIVE
Specific Gravity, Urine: 1.025 (ref 1.005–1.030)
pH: 5.5 (ref 5.0–8.0)

## 2011-04-29 LAB — URINE MICROSCOPIC-ADD ON

## 2011-04-29 LAB — CBC
HCT: 39.1 % (ref 36.0–46.0)
Hemoglobin: 12.9 g/dL (ref 12.0–15.0)
MCH: 30.7 pg (ref 26.0–34.0)
MCHC: 33 g/dL (ref 30.0–36.0)
RDW: 12.8 % (ref 11.5–15.5)

## 2011-04-29 LAB — DIFFERENTIAL
Basophils Relative: 0 % (ref 0–1)
Eosinophils Absolute: 0 10*3/uL (ref 0.0–0.7)
Monocytes Absolute: 0.4 10*3/uL (ref 0.1–1.0)
Monocytes Relative: 4 % (ref 3–12)
Neutrophils Relative %: 78 % — ABNORMAL HIGH (ref 43–77)

## 2011-04-29 LAB — GLUCOSE, CAPILLARY

## 2011-04-29 LAB — COMPREHENSIVE METABOLIC PANEL
Albumin: 4 g/dL (ref 3.5–5.2)
BUN: 28 mg/dL — ABNORMAL HIGH (ref 6–23)
Creatinine, Ser: 0.79 mg/dL (ref 0.50–1.10)
Total Protein: 7.1 g/dL (ref 6.0–8.3)

## 2011-04-29 LAB — LIPASE, BLOOD: Lipase: 14 U/L (ref 11–59)

## 2011-04-29 MED ORDER — SODIUM CHLORIDE 0.9 % IV SOLN
Freq: Once | INTRAVENOUS | Status: AC
  Administered 2011-04-29: 13:00:00 via INTRAVENOUS

## 2011-04-29 MED ORDER — ONDANSETRON HCL 4 MG PO TABS: 4.0000 mg | ORAL_TABLET | Freq: Four times a day (QID) | ORAL | Status: DC

## 2011-04-29 MED ORDER — HYDROMORPHONE HCL 1 MG/ML IJ SOLN
1.0000 mg | Freq: Once | INTRAMUSCULAR | Status: AC
  Administered 2011-04-29: 1 mg via INTRAVENOUS
  Filled 2011-04-29: qty 1

## 2011-04-29 MED ORDER — ONDANSETRON HCL 4 MG/2ML IJ SOLN
4.0000 mg | Freq: Once | INTRAMUSCULAR | Status: AC
  Administered 2011-04-29: 4 mg via INTRAVENOUS
  Filled 2011-04-29: qty 2

## 2011-04-29 MED ORDER — SODIUM CHLORIDE 0.9 % IV SOLN
Freq: Once | INTRAVENOUS | Status: AC
  Administered 2011-04-29: 15:00:00 via INTRAVENOUS

## 2011-04-29 MED ORDER — IOHEXOL 300 MG/ML  SOLN
100.0000 mL | Freq: Once | INTRAMUSCULAR | Status: AC | PRN
  Administered 2011-04-29: 100 mL via INTRAVENOUS

## 2011-04-29 MED ORDER — PANTOPRAZOLE SODIUM 20 MG PO TBEC: 40.0000 mg | DELAYED_RELEASE_TABLET | Freq: Every day | ORAL | Status: DC

## 2011-04-29 MED ORDER — PANTOPRAZOLE SODIUM 40 MG IV SOLR
40.0000 mg | Freq: Once | INTRAVENOUS | Status: AC
  Administered 2011-04-29: 40 mg via INTRAVENOUS
  Filled 2011-04-29: qty 40

## 2011-04-29 NOTE — ED Provider Notes (Signed)
Scribed for Donnetta Hutching, MD, the patient was seen in room [facility]. This chart was scribed by [employer]. The patient's care started at 12:07  CSN: [id number] Arrival date & time: 04/29/2011 11:05 AM  Chief Complaint  Patient presents with  . Abdominal Pain  . Nausea   HPI [name] is a 50 y.o. female with a history of diabetes mellitus who presents to the Emergency Department complaining of worsening abdominal pain, onset 04/23/11 with associated pain localized to the thoracic area and vaginal bleeding. Patient localizes abdominal pain to the epigastric and suprapubic abdomen. Reports taking lomotil for diarrhea and medications for dizziness (unknown). Reports mild alcohol use with increased alcohol use within the past week  HPI ELEMENTS: Location: Abdomen  Onset: 04/23/11 Duration: 1 week  Severity: 10/10 on NPS  Context: as above  Associated symptoms: as above     Past Medical History  Diagnosis Date  . Diabetes mellitus   . Depression     Past Surgical History  Procedure Date  . Knee surgery   . Cystectomy   . Shoulder surgery     right  . Endometrial ablation     Family History  Problem Relation Age of Onset  . Hypertension Mother   . Cancer Mother   . Asthma Sister   . Asthma Brother     History  Substance Use Topics  . Smoking status: Former Smoker -- 0.5 packs/day for 15 years    Types: Cigarettes    Quit date: 04/29/1991  . Smokeless tobacco: Never Used  . Alcohol Use: 0.6 oz/week    1 Glasses of wine per week    OB History    Grav Para Term Preterm Abortions TAB SAB Ect Mult Living   5 4 4  1  1   4       Review of Systems  Gastrointestinal: Positive for abdominal pain.  Genitourinary: Positive for vaginal bleeding.  Musculoskeletal: Positive for back pain.    Allergies  Review of patient's allergies indicates no known allergies.  Home Medications   Current Outpatient Rx  Name Route Sig Dispense Refill  .  CYCLOBENZAPRINE HCL 10 MG PO TABS Oral Take 10 mg by mouth daily.      Marland Kitchen DIAZEPAM 10 MG PO TABS Oral Take 10 mg by mouth as needed.      . DULOXETINE HCL 60 MG PO CPEP Oral Take 60 mg by mouth daily.      . INSULIN GLARGINE 100 UNIT/ML [place] SOLN Subcutaneous Inject 60 Units into the skin at bedtime.      . INSULIN LISPRO (HUMAN) 100 UNIT/ML [place] SOLN Subcutaneous Inject 60 Units into the skin 3 (three) times daily before meals.      Marland Kitchen METFORMIN HCL 1000 MG PO TABS Oral Take 1,000 mg by mouth 2 (two) times daily with a meal.      . OXYCODONE-ACETAMINOPHEN 10-500 MG PO TABS Oral Take 1 tablet by mouth every 6 (six) hours as needed.      . TRAZODONE HCL 50 MG PO TABS Oral Take 50 mg by mouth at bedtime.      Marland Kitchen UNKNOWN TO PATIENT  Med for menopause       BP 98/60  Pulse 84  Temp(Src) 97.7 F (36.5 C) (Oral)  Ht 5\' 5"  (1.651 m)  Wt 160 lb (72.576 kg)  BMI 26.63 kg/m2  SpO2 99%  Physical Exam  Nursing note and vitals reviewed. Constitutional: She is oriented to person, place, and  time. She appears well-developed and well-nourished.       Awake, alert, nontoxic appearance.  HENT:  Head: Normocephalic and atraumatic.  Mouth/Throat: Oropharynx is clear and moist. No oropharyngeal exudate.  Eyes: Pupils are equal, round, and reactive to light. Right eye exhibits no discharge. Left eye exhibits no discharge.  Neck: Neck supple. No tracheal deviation present.  Cardiovascular: Normal rate and normal heart sounds.   Pulmonary/Chest: Effort normal. She has no wheezes. She exhibits no tenderness.  Abdominal: Soft. There is tenderness (Epigastrium and midline of lower abdomen). There is no rebound.  Musculoskeletal: She exhibits no tenderness.       Thoracic back: She exhibits tenderness (tender over T8).       Baseline ROM, no obvious new focal weakness.  Lymphadenopathy:    She has no cervical adenopathy.  Neurological: She is alert and oriented to person, place, and time. No cranial nerve deficit.        Mental status and motor strength appears baseline for patient and situation.  Skin: Skin is warm and dry. No rash noted. She is not diaphoretic. No erythema.  Psychiatric: She has a normal mood and affect.    ED Course  Procedures  OTHER DATA REVIEWED: Nursing notes, vital signs, and past medical records reviewed.    DIAGNOSTIC STUDIES: Oxygen Saturation is 99% on room air, normal by my interpretation.    LABS / RADIOLOGY:  Results for orders placed during the hospital encounter of 04/29/11  CBC      Component Value Range   WBC 8.3  4.0 - 10.5 (K/uL)   RBC 4.20  3.87 - 5.11 (MIL/uL)   Hemoglobin 12.9  12.0 - 15.0 (g/dL)   HCT 47.8  29.5 - 62.1 (%)   MCV 93.1  78.0 - 100.0 (fL)   MCH 30.7  26.0 - 34.0 (pg)   MCHC 33.0  30.0 - 36.0 (g/dL)   RDW 30.8  65.7 - 84.6 (%)   Platelets 402 (*) 150 - 400 (K/uL)  DIFFERENTIAL      Component Value Range   Neutrophils Relative 78 (*) 43 - 77 (%)   Neutro Abs 6.5  1.7 - 7.7 (K/uL)   Lymphocytes Relative 17  12 - 46 (%)   Lymphs Abs 1.5  0.7 - 4.0 (K/uL)   Monocytes Relative 4  3 - 12 (%)   Monocytes Absolute 0.4  0.1 - 1.0 (K/uL)   Eosinophils Relative 0  0 - 5 (%)   Eosinophils Absolute 0.0  0.0 - 0.7 (K/uL)   Basophils Relative 0  0 - 1 (%)   Basophils Absolute 0.0  0.0 - 0.1 (K/uL)  COMPREHENSIVE METABOLIC PANEL      Component Value Range   Sodium 130 (*) 135 - 145 (mEq/L)   Potassium 4.2  3.5 - 5.1 (mEq/L)   Chloride 88 (*) 96 - 112 (mEq/L)   CO2 17 (*) 19 - 32 (mEq/L)   Glucose, Bld 431 (*) 70 - 99 (mg/dL)   BUN 28 (*) 6 - 23 (mg/dL)   Creatinine, Ser 9.62  0.50 - 1.10 (mg/dL)   Calcium 95.2  8.4 - 10.5 (mg/dL)   Total Protein 7.1  6.0 - 8.3 (g/dL)   Albumin 4.0  3.5 - 5.2 (g/dL)   AST 10  0 - 37 (U/L)   ALT 10  0 - 35 (U/L)   Alkaline Phosphatase 125 (*) 39 - 117 (U/L)   Total Bilirubin 0.6  0.3 - 1.2 (mg/dL)   GFR  calc non Af Amer >90  >90 (mL/min)   GFR calc Af Amer >90  >90 (mL/min)  LIPASE, BLOOD       Component Value Range   Lipase 14  11 - 59 (U/L)      ED COURSE / COORDINATION OF CARE: 12:10 - EDP collected patient history, discussed symptoms and ordered Blood work, pain management, and CT scan  MDM: Patient has had nausea and vomiting or couple days. Slightly dehydrated on physical exam. Slight decrease in CO2 noted. Patient feeling much better after 2 L of fluid. CT scan negative. Patient feels that she can go home. She understands to return if feeling worse and unable to keep fluids down. Will check glucose frequently.  rx for Zofran and protonic  IMPRESSION: Diagnoses that have been ruled out:  Diagnoses that are still under consideration:  Final diagnoses:     MEDICATIONS GIVEN IN THE E.D. Medications - No data to display  DISCHARGE MEDICATIONS: New Prescriptions   No medications on file    SCRIBE ATTESTATION:I personally performed the services described in this documentation, which was scribed in my presence. The recorded information has been reviewed and considered.   Donnetta Hutching, MD 04/29/11 [phone]

## 2011-04-29 NOTE — ED Notes (Signed)
Resting quietly in bed with eyes closed. Resp even and unlabored. No s/s pain or discomfort noted. 

## 2011-04-29 NOTE — ED Notes (Signed)
Patient c/o generalized abd pain with nausea and diarrhea x3 days that has progressively gotten worse. Denies any vomiting.

## 2011-04-29 NOTE — ED Notes (Signed)
cbg [id number]. Dr. Adriana Simas notified

## 2011-04-30 ENCOUNTER — Inpatient Hospital Stay (HOSPITAL_COMMUNITY)
Admission: EM | Admit: 2011-04-30 | Discharge: 2011-05-03 | DRG: 637 | Disposition: A | Discharge status: Home / Self Care | Payer: Medicaid Other | Attending: Internal Medicine | Admitting: Internal Medicine

## 2011-04-30 ENCOUNTER — Encounter (HOSPITAL_COMMUNITY): Payer: Self-pay

## 2011-04-30 DIAGNOSIS — E876 Hypokalemia: Secondary | ICD-10-CM | POA: Diagnosis not present

## 2011-04-30 DIAGNOSIS — D259 Leiomyoma of uterus, unspecified: Secondary | ICD-10-CM | POA: Diagnosis present

## 2011-04-30 DIAGNOSIS — G9341 Metabolic encephalopathy: Secondary | ICD-10-CM | POA: Diagnosis present

## 2011-04-30 DIAGNOSIS — N950 Postmenopausal bleeding: Secondary | ICD-10-CM | POA: Diagnosis present

## 2011-04-30 DIAGNOSIS — Z9189 Other specified personal risk factors, not elsewhere classified: Secondary | ICD-10-CM | POA: Diagnosis present

## 2011-04-30 DIAGNOSIS — Z794 Long term (current) use of insulin: Secondary | ICD-10-CM

## 2011-04-30 DIAGNOSIS — R112 Nausea with vomiting, unspecified: Secondary | ICD-10-CM

## 2011-04-30 DIAGNOSIS — R9389 Abnormal findings on diagnostic imaging of other specified body structures: Secondary | ICD-10-CM | POA: Diagnosis present

## 2011-04-30 DIAGNOSIS — E1310 Other specified diabetes mellitus with ketoacidosis without coma: Principal | ICD-10-CM | POA: Diagnosis present

## 2011-04-30 DIAGNOSIS — N939 Abnormal uterine and vaginal bleeding, unspecified: Secondary | ICD-10-CM | POA: Diagnosis not present

## 2011-04-30 DIAGNOSIS — E1110 Type 2 diabetes mellitus with ketoacidosis without coma: Secondary | ICD-10-CM | POA: Diagnosis present

## 2011-04-30 DIAGNOSIS — J029 Acute pharyngitis, unspecified: Secondary | ICD-10-CM | POA: Diagnosis not present

## 2011-04-30 DIAGNOSIS — R5383 Other fatigue: Secondary | ICD-10-CM | POA: Diagnosis present

## 2011-04-30 LAB — GLUCOSE, CAPILLARY
Glucose-Capillary: >600 mg/dL (ref 70–99)
Glucose-Capillary: 596 mg/dL (ref 70–99)
Glucose-Capillary: 422 mg/dL — ABNORMAL HIGH (ref 70–99)
Glucose-Capillary: 237 mg/dL — ABNORMAL HIGH (ref 70–99)
Glucose-Capillary: 237 mg/dL — ABNORMAL HIGH (ref 70–99)
Glucose-Capillary: 167 mg/dL — ABNORMAL HIGH (ref 70–99)
Glucose-Capillary: 110 mg/dL — ABNORMAL HIGH (ref 70–99)
Glucose-Capillary: 130 mg/dL — ABNORMAL HIGH (ref 70–99)
Glucose-Capillary: 142 mg/dL — ABNORMAL HIGH (ref 70–99)

## 2011-04-30 LAB — BLOOD GAS, ARTERIAL
FIO2: 0.21 %
O2 Saturation: 96.6 %
pCO2 arterial: 12.9 mmHg — CL (ref 35.0–45.0)
pH, Arterial: 6.973 — CL (ref 7.350–7.400)

## 2011-04-30 LAB — CBC
HCT: 39.4 % (ref 36.0–46.0)
Hemoglobin: 12.3 g/dL (ref 12.0–15.0)
MCH: 30.4 pg (ref 26.0–34.0)
MCHC: 31.2 g/dL (ref 30.0–36.0)
MCV: 98.8 fL (ref 78.0–100.0)
MCV: 98.3 fL (ref 78.0–100.0)
Platelets: 475 10*3/uL — ABNORMAL HIGH (ref 150–400)
RBC: 4.14 MIL/uL (ref 3.87–5.11)
RDW: 12.9 % (ref 11.5–15.5)

## 2011-04-30 LAB — BASIC METABOLIC PANEL
BUN: 23 mg/dL (ref 6–23)
CO2: 6 mEq/L — CL (ref 19–32)
CO2: 8 mEq/L — CL (ref 19–32)
CO2: 15 mEq/L — ABNORMAL LOW (ref 19–32)
CO2: 19 mEq/L (ref 19–32)
Calcium: 10.4 mg/dL (ref 8.4–10.5)
Calcium: 8.6 mg/dL (ref 8.4–10.5)
Chloride: 107 mEq/L (ref 96–112)
Chloride: 110 mEq/L (ref 96–112)
Chloride: 111 mEq/L (ref 96–112)
Creatinine, Ser: 1.44 mg/dL — ABNORMAL HIGH (ref 0.50–1.10)
Creatinine, Ser: 1.08 mg/dL (ref 0.50–1.10)
Glucose, Bld: 212 mg/dL — ABNORMAL HIGH (ref 70–99)
Glucose, Bld: 120 mg/dL — ABNORMAL HIGH (ref 70–99)
Potassium: 4.7 mEq/L (ref 3.5–5.1)
Potassium: 4.3 mEq/L (ref 3.5–5.1)
Potassium: 3.9 mEq/L (ref 3.5–5.1)
Sodium: 137 mEq/L (ref 135–145)
Sodium: 137 mEq/L (ref 135–145)

## 2011-04-30 LAB — RAPID URINE DRUG SCREEN, HOSP PERFORMED
Amphetamines: NOT DETECTED
Barbiturates: NOT DETECTED
Cocaine: NOT DETECTED
Tetrahydrocannabinol: NOT DETECTED

## 2011-04-30 LAB — URINALYSIS, ROUTINE W REFLEX MICROSCOPIC
Glucose, UA: >1000 mg/dL — AB
Ketones, ur: >80 mg/dL — AB
Leukocytes, UA: NEGATIVE
Protein, ur: NEGATIVE mg/dL

## 2011-04-30 LAB — CREATININE, SERUM
GFR calc Af Amer: 54 mL/min — ABNORMAL LOW (ref >90)
GFR calc non Af Amer: 47 mL/min — ABNORMAL LOW (ref >90)

## 2011-04-30 LAB — MRSA PCR SCREENING: MRSA by PCR: NEGATIVE

## 2011-04-30 LAB — LACTIC ACID, PLASMA: Lactic Acid, Venous: 3.1 mmol/L — ABNORMAL HIGH (ref 0.5–2.2)

## 2011-04-30 MED ORDER — MAGIC MOUTHWASH
5.0000 mL | Freq: Three times a day (TID) | ORAL | Status: DC | PRN
  Administered 2011-04-30 – 2011-05-01 (×2): 5 mL via ORAL
  Filled 2011-04-30 (×2): qty 5

## 2011-04-30 MED ORDER — MAGIC MOUTHWASH W/LIDOCAINE: 5.0000 mL | Freq: Three times a day (TID) | ORAL | Status: DC | PRN

## 2011-04-30 MED ORDER — SODIUM CHLORIDE 0.9 % IV SOLN: INTRAVENOUS | Status: AC

## 2011-04-30 MED ORDER — SODIUM CHLORIDE 0.9 % IV BOLUS (SEPSIS)
2000.0000 mL | Freq: Once | INTRAVENOUS | Status: AC
  Administered 2011-04-30: 2000 mL via INTRAVENOUS

## 2011-04-30 MED ORDER — ONDANSETRON HCL 4 MG PO TABS
4.0000 mg | ORAL_TABLET | Freq: Once | ORAL | Status: DC
  Filled 2011-04-30: qty 1

## 2011-04-30 MED ORDER — SODIUM CHLORIDE 0.9 % IV SOLN
INTRAVENOUS | Status: DC
  Administered 2011-04-30: 10:00:00 via INTRAVENOUS
  Administered 2011-04-30: 250 mL via INTRAVENOUS

## 2011-04-30 MED ORDER — POTASSIUM CHLORIDE 10 MEQ/100ML IV SOLN
10.0000 meq | INTRAVENOUS | Status: AC
  Administered 2011-04-30 (×4): 10 meq via INTRAVENOUS
  Filled 2011-04-30: qty 400

## 2011-04-30 MED ORDER — DEXTROSE 50 % IV SOLN: 25.0000 mL | INTRAVENOUS | Status: DC | PRN

## 2011-04-30 MED ORDER — INSULIN REGULAR HUMAN 100 UNIT/ML IJ SOLN
INTRAMUSCULAR | Status: DC
  Administered 2011-04-30: 10.6 [IU]/h via INTRAVENOUS
  Filled 2011-04-30 (×2): qty 1

## 2011-04-30 MED ORDER — SODIUM CHLORIDE 0.9 % IV SOLN
INTRAVENOUS | Status: DC
  Administered 2011-04-30: 06:00:00 via INTRAVENOUS

## 2011-04-30 MED ORDER — HYDROMORPHONE HCL 1 MG/ML IJ SOLN
1.0000 mg | Freq: Once | INTRAMUSCULAR | Status: AC
  Administered 2011-04-30: 1 mg via INTRAMUSCULAR
  Filled 2011-04-30: qty 1

## 2011-04-30 MED ORDER — INSULIN REGULAR HUMAN 100 UNIT/ML IJ SOLN
INTRAMUSCULAR | Status: DC
  Administered 2011-04-30: 5.4 [IU]/h via INTRAVENOUS
  Filled 2011-04-30: qty 1

## 2011-04-30 MED ORDER — INSULIN GLARGINE 100 UNIT/ML ~~LOC~~ SOLN
40.0000 [IU] | Freq: Once | SUBCUTANEOUS | Status: AC
  Administered 2011-04-30: 40 [IU] via SUBCUTANEOUS
  Filled 2011-04-30: qty 3

## 2011-04-30 MED ORDER — DEXTROSE-NACL 5-0.45 % IV SOLN
INTRAVENOUS | Status: DC
  Administered 2011-04-30 (×2): via INTRAVENOUS

## 2011-04-30 MED ORDER — INSULIN REGULAR HUMAN 100 UNIT/ML IJ SOLN
INTRAMUSCULAR | Status: AC
  Filled 2011-04-30: qty 3

## 2011-04-30 MED ORDER — DEXTROSE-NACL 5-0.45 % IV SOLN: INTRAVENOUS | Status: DC

## 2011-04-30 MED ORDER — ENOXAPARIN SODIUM 40 MG/0.4ML ~~LOC~~ SOLN
40.0000 mg | SUBCUTANEOUS | Status: DC
  Administered 2011-04-30 – 2011-05-03 (×4): 40 mg via SUBCUTANEOUS
  Filled 2011-04-30 (×4): qty 0.4

## 2011-04-30 MED ORDER — SODIUM CHLORIDE 0.9 % IV BOLUS (SEPSIS)
1000.0000 mL | Freq: Once | INTRAVENOUS | Status: AC
  Administered 2011-04-30: 1000 mL via INTRAVENOUS

## 2011-04-30 MED ORDER — ONDANSETRON HCL 4 MG/2ML IJ SOLN
4.0000 mg | INTRAMUSCULAR | Status: DC | PRN
  Administered 2011-04-30: 4 mg via INTRAVENOUS
  Filled 2011-04-30: qty 2

## 2011-04-30 NOTE — ED Notes (Signed)
Pt apparently seen in the e.d. Earlier Sunday, had ct scan, states she went home and now with vomiting and diarrhea.

## 2011-04-30 NOTE — Progress Notes (Signed)
Paged Dr. Orvan Falconer to ask if we could now transition off insulin drip since anion gap 10.9 and last 4 cbg's were within normal range 138, 153, 110, and 118.  Also asked if pt could have diet instead of npo.  Orders received to keep pt NPO, keep pt on insulin drip and continue glucostabilizer, continue hourly accuchecks, and to administer 40 units Lantus insulin sq.  Will continue to monitor closely.

## 2011-04-30 NOTE — H&P (Signed)
PCP:   Colette Ribas, MD   Chief Complaint:  N/v/d x 2-3 days  HPI: [name] is an 50 y.o. female.  With DM maintained on Lantus and metformin, who present sto the hospital for the second time in 2 days with V+D now lethargic and unable to give a clear history. Initial labs were severely deranged and hosptialist was called to assist.  Patient his unable to contribute much to history becuase of altererd mentation and family have just left. Admits of fever.  Past Medical History  Diagnosis Date  . Diabetes mellitus   . Depression     Past Surgical History  Procedure Date  . Knee surgery   . Cystectomy   . Shoulder surgery     right  . Endometrial ablation     Medications:  HOME MEDS: Prior to Admission medications   Medication Sig Start Date End Date Taking? Authorizing Provider  cyclobenzaprine (FLEXERIL) 10 MG tablet Take 10 mg by mouth daily.     Yes Historical Provider, MD  diazepam (VALIUM) 10 MG tablet Take 10 mg by mouth as needed.     Yes Historical Provider, MD  DULoxetine (CYMBALTA) 60 MG capsule Take 60 mg by mouth daily.     Yes Historical Provider, MD  insulin glargine (LANTUS) 100 UNIT/ML injection Inject 20 Units into the skin at bedtime.    Yes Historical Provider, MD  insulin lispro (HUMALOG) 100 UNIT/ML injection Inject 0-15 Units into the skin 3 (three) times daily before meals. Sliding scale used based on BG levels   Yes Historical Provider, MD  metFORMIN (GLUCOPHAGE) 1000 MG tablet Take 1,000 mg by mouth 2 (two) times daily with a meal.     Yes Historical Provider, MD  ondansetron (ZOFRAN) 4 MG tablet Take 1 tablet (4 mg total) by mouth every 6 (six) hours. 04/29/11 05/06/11 Yes Donnetta Hutching, MD  oxycodone-acetaminophen (NARVOX) 10-500 MG per tablet Take 1 tablet by mouth every 6 (six) hours as needed.     Yes Historical Provider, MD  pantoprazole (PROTONIX) 20 MG tablet Take 2 tablets (40 mg total) by mouth daily. 04/29/11 04/28/12 Yes Donnetta Hutching, MD  traZODone (DESYREL) 50 MG tablet Take 50 mg by mouth at bedtime.     Yes Historical Provider, MD  UNKNOWN TO PATIENT Med for menopause    Yes Historical Provider, MD    PRIOR TO AMDISSION MEDS Medications Prior to Admission  Medication Dose Route Frequency Provider Last Rate Last Dose  . 0.9 %  sodium chloride infusion   Intravenous Once Donnetta Hutching, MD      . 0.9 %  sodium chloride infusion   Intravenous Once Donnetta Hutching, MD      . 0.9 %  sodium chloride infusion   Intravenous Continuous Nicoletta Dress. Colon Branch, MD 125 mL/hr at 04/30/11 1610    . dextrose 5 %-0.45 % sodium chloride infusion   Intravenous Continuous Nicoletta Dress. Colon Branch, MD      . dextrose 50 % solution 25 mL  25 mL Intravenous PRN Nicoletta Dress. Colon Branch, MD      . HYDROmorphone (DILAUDID) injection 1 mg  1 mg Intravenous Once Donnetta Hutching, MD   1 mg at 04/29/11 1318  . HYDROmorphone (DILAUDID) injection 1 mg  1 mg Intramuscular Once [employer]. Colon Branch, MD   1 mg at 04/30/11 0446  . insulin regular (HUMULIN R,NOVOLIN R) 1 Units/mL in sodium chloride 0.9 % 100 mL infusion   Intravenous Continuous Nicoletta Dress. Colon Branch, MD  5.4 mL/hr at 04/30/11 0621 5.4 Units/hr at 04/30/11 0621  . iohexol (OMNIPAQUE) 300 MG/ML injection 100 mL  100 mL Intravenous Once PRN Medication Radiologist   100 mL at 04/29/11 1447  . ondansetron (ZOFRAN) injection 4 mg  4 mg Intravenous Once Donnetta Hutching, MD   4 mg at 04/29/11 1318  . ondansetron (ZOFRAN) tablet 4 mg  4 mg Oral Once [employer]. Colon Branch, MD      . pantoprazole (PROTONIX) injection 40 mg  40 mg Intravenous Once Donnetta Hutching, MD   40 mg at 04/29/11 1318  . sodium chloride 0.9 % bolus 1,000 mL  1,000 mL Intravenous Once [employer]. Colon Branch, MD      . sodium chloride 0.9 % bolus 2,000 mL  2,000 mL Intravenous Once Vania Rea       Medications Prior to Admission  Medication Sig Dispense Refill  . cyclobenzaprine (FLEXERIL) 10 MG tablet Take 10 mg by mouth daily.        . diazepam (VALIUM) 10 MG tablet Take 10 mg by  mouth as needed.        . DULoxetine (CYMBALTA) 60 MG capsule Take 60 mg by mouth daily.        . insulin glargine (LANTUS) 100 UNIT/ML injection Inject 20 Units into the skin at bedtime.       . insulin lispro (HUMALOG) 100 UNIT/ML injection Inject 0-15 Units into the skin 3 (three) times daily before meals. Sliding scale used based on BG levels      . metFORMIN (GLUCOPHAGE) 1000 MG tablet Take 1,000 mg by mouth 2 (two) times daily with a meal.        . ondansetron (ZOFRAN) 4 MG tablet Take 1 tablet (4 mg total) by mouth every 6 (six) hours.  12 tablet  0  . oxycodone-acetaminophen (NARVOX) 10-500 MG per tablet Take 1 tablet by mouth every 6 (six) hours as needed.        . pantoprazole (PROTONIX) 20 MG tablet Take 2 tablets (40 mg total) by mouth daily.  10 tablet  1  . traZODone (DESYREL) 50 MG tablet Take 50 mg by mouth at bedtime.        Marland Kitchen UNKNOWN TO PATIENT Med for menopause         Allergies:  No Known Allergies  Social History:   reports that she quit smoking about 20 years ago. Her smoking use included Cigarettes. She has a 7.5 pack-year smoking history. She has never used smokeless tobacco. She reports that she drinks about .6 ounces of alcohol per week. She reports that she uses illicit drugs (Marijuana and Cocaine).  Family History: Family History  Problem Relation Age of Onset  . Hypertension Mother   . Cancer Mother   . Asthma Sister   . Asthma Brother     Rewiew of Systems:  Unable to obtain because of patient's mental status.  Physical Exam: Filed Vitals:   04/30/11 0321 04/30/11 0629  BP: 117/59 116/59  Pulse: 109 113  Temp: 97.8 F (36.6 C)   TempSrc: Oral Oral  Resp: 16   SpO2: 100% 98%   Blood pressure 116/59, pulse 113, temperature 97.8 F (36.6 C), temperature source Oral, resp. rate 16, SpO2 98.00%.  FAO:ZHYQMVHQIO African american lady lying in the stretcher tachpnoeic and lethargic. Extremely ill-l;ooking;  PSYCH: He is alert and oriented x4; does  not appear anxious does not appear depressed; affect is normal HEENT: Mucous membranes pink dryand anicteric; PERRLA; EOM intact; no  neck stiffness, no cervical lymphadenopathy nor thyromegaly or carotid bruit; no JVD; Breasts:: Not examined CHEST WALL: No tenderness CHEST:deep respirations, clear to auscultation bilaterally HEART: Regular rate tachycardic no murmurs rubs or gallops BACK: No kyphosis or scoliosis; no CVA tenderness ABDOMEN: soft non-tender; no masses, no organomegaly, normal abdominal bowel sounds; no pannus; no intertriginous candida. Rectal Exam: Not done EXTREMITIES: No bone or joint deformity; age-appropriate arthropathy of the hands and knees; no edema; no ulcerations. Genitalia: not examined PULSES: 2+ and symmetric SKIN: pale no rash or ulceration CNS: Cranial nerves 2-12 grossly intact no focal neurologic deficit   Labs & Imaging Results for orders placed during the hospital encounter of 04/30/11 (from the past 48 hour(s))  CBC     Status: Abnormal   Collection Time   04/30/11  4:22 AM      Component Value Range Comment   WBC 15.6 (*) 4.0 - 10.5 (K/uL)    RBC 4.14  3.87 - 5.11 (MIL/uL)    Hemoglobin 12.6  12.0 - 15.0 (g/dL)    HCT 16.1  09.6 - 04.5 (%)    MCV 98.8  78.0 - 100.0 (fL)    MCH 30.4  26.0 - 34.0 (pg)    MCHC 30.8  30.0 - 36.0 (g/dL)    RDW 40.9  81.1 - 91.4 (%)    Platelets 475 (*) 150 - 400 (K/uL)   BASIC METABOLIC PANEL     Status: Abnormal   Collection Time   04/30/11  4:22 AM      Component Value Range Comment   Sodium 133 (*) 135 - 145 (mEq/L)    Potassium 5.6 (*) 3.5 - 5.1 (mEq/L) DELTA CHECK NOTED   Chloride 90 (*) 96 - 112 (mEq/L)    CO2 6 (*) 19 - 32 (mEq/L)    Glucose, Bld 693 (*) 70 - 99 (mg/dL)    BUN 41 (*) 6 - 23 (mg/dL)    Creatinine, Ser 7.82 (*) 0.50 - 1.10 (mg/dL)    Calcium 95.6  8.4 - 10.5 (mg/dL)    GFR calc non Af Amer 42 (*) >90 (mL/min)    GFR calc Af Amer 48 (*) >90 (mL/min)   BLOOD GAS, ARTERIAL     Status:  Abnormal   Collection Time   04/30/11  5:58 AM      Component Value Range Comment   FIO2 0.21      Delivery systems ROOM AIR      pH, Arterial 6.973 (*) 7.350 - 7.400     pCO2 arterial 12.9 (*) 35.0 - 45.0 (mmHg)    pO2, Arterial 127.0 (*) 80.0 - 100.0 (mmHg)    Bicarbonate 2.8 (*) 20.0 - 24.0 (mEq/L)    Acid-base deficit 26.8 (*) 0.0 - 2.0 (mmol/L)    O2 Saturation 96.6      Patient temperature 37.0      Collection site RADIAL      Drawn by COLLECTED BY RT      Sample type ARTERIAL      Allens test (pass/fail) PASS  PASS    GLUCOSE, CAPILLARY     Status: Abnormal   Collection Time   04/30/11  6:09 AM      Component Value Range Comment   Glucose-Capillary >600 (*) 70 - 99 (mg/dL)   URINALYSIS, ROUTINE W REFLEX MICROSCOPIC     Status: Abnormal   Collection Time   04/30/11  6:19 AM      Component Value Range Comment   Color, Urine  YELLOW  YELLOW     Appearance CLEAR  CLEAR     Specific Gravity, Urine 1.020  1.005 - 1.030     pH 5.5  5.0 - 8.0     Glucose, UA >1000 (*) NEGATIVE (mg/dL)    Hgb urine dipstick SMALL (*) NEGATIVE     Bilirubin Urine NEGATIVE  NEGATIVE     Ketones, ur >80 (*) NEGATIVE (mg/dL)    Protein, ur NEGATIVE  NEGATIVE (mg/dL)    Urobilinogen, UA 0.2  0.0 - 1.0 (mg/dL)    Nitrite NEGATIVE  NEGATIVE     Leukocytes, UA NEGATIVE  NEGATIVE    URINE MICROSCOPIC-ADD ON     Status: Abnormal   Collection Time   04/30/11  6:19 AM      Component Value Range Comment   Squamous Epithelial / LPF FEW (*) RARE     RBC / HPF 3-6  <3 (RBC/hpf)    Ct Abdomen Pelvis W Contrast  04/29/2011  *RADIOLOGY REPORT*  Clinical Data: 50 year old female with abdominal and pelvic pain.  CT ABDOMEN AND PELVIS WITH CONTRAST  Technique:  Multidetector CT imaging of the abdomen and pelvis was performed following the standard protocol during bolus administration of intravenous contrast.  Contrast: OMNIPAQUE IOHEXOL 300 MG/ML IV SOLN  Comparison: None  Findings: Mild fatty infiltration of  the liver is identified. The spleen, adrenal glands, gallbladder, pancreas and kidneys are unremarkable.  Mild gastric distention is identified with oral contrast is noted within the small bowel. No other bowel abnormalities are identified. The appendix is normal.  No free fluid, enlarged lymph nodes, biliary dilation or abdominal aortic aneurysm identified.  The bladder is distended. Small uterine fibroids are identified. There is no evidence of abscess or pneumoperitoneum.  No acute or suspicious bony abnormalities are noted.  IMPRESSION: Mild gastric distention without evidence of high-grade obstruction.  Bladder distention.  Mild fatty infiltration of the liver.  Small uterine fibroids.  Original Report Authenticated By: Rosendo Gros, M.D.      Assessment Present on Admission:  .DKA (diabetic ketoacidoses) ?lactic acidosis .Confusion state .Lethargy Toxic/metabolic encephalopathy ? aggravated by medication drug toxicity.   PLAN: Will hydrate vigorously; check lactic acid, place on DKA protocol, blood and urine cultures but withhold antibiotics of now. Keep NPO until mentation improves. Check stool for C.diff  Other plans as per orders.  Critical care time: 60 minutes.   Marianny Goris 04/30/2011, 6:57 AM

## 2011-04-30 NOTE — ED Notes (Signed)
Pt husband Lesle Reek [phone].

## 2011-04-30 NOTE — ED Notes (Signed)
PATIENT FLUIDS WIDE OPEN PER DR. Orvan Falconer

## 2011-04-30 NOTE — Progress Notes (Signed)
Subjective: Is more alert, complains of being thirsty.  Objective: Vital signs in last 24 hours: Temp:  [97.8 F (36.6 C)-99.2 F (37.3 C)] 98.2 F (36.8 C) (10/08 0907) Pulse Rate:  [109-116] 114  (10/08 0907) Resp:  [14-20] 17  (10/08 0907) BP: (103-117)/(53-66) 103/53 mmHg (10/08 0907) SpO2:  [98 %-100 %] 100 % (10/08 0907) Weight change:     Intake/Output from previous day:       Physical Exam: General: Alert, awake, oriented x3 HEENT: No bruits, no goiter, dry mucous membranes Heart: Tachycardic but with regular rhythm rhythm, without murmurs, rubs, gallops. Lungs: Clear to auscultation bilaterally. Abdomen: Soft, nontender, nondistended, positive bowel sounds. Extremities: No clubbing cyanosis or edema with positive pedal pulses. Neuro: Grossly intact, nonfocal.    Lab Results: Basic Metabolic Panel:  Basename 04/30/11 0422 04/29/11 1143  NA 133* 130*  K 5.6* 4.2  CL 90* 88*  CO2 6* 17*  GLUCOSE 693* 431*  BUN 41* 28*  CREATININE 1.44* 0.79  CALCIUM 10.4 10.2  MG -- --  PHOS -- --   Liver Function Tests:  Basename 04/29/11 1143  AST 10  ALT 10  ALKPHOS 125*  BILITOT 0.6  PROT 7.1  ALBUMIN 4.0    Basename 04/29/11 1143  LIPASE 14  AMYLASE --   CBC:  Basename 04/30/11 0422 04/29/11 1143  WBC 15.6* 8.3  NEUTROABS -- 6.5  HGB 12.6 12.9  HCT 40.9 39.1  MCV 98.8 93.1  PLT 475* 402*   CBG:  Basename 04/30/11 0814 04/30/11 0717 04/30/11 0609 04/29/11 1505  GLUCAP 596* >600* >600* 407*    Recent Results (from the past 240 hour(s))  CULTURE, BLOOD (ROUTINE X 2)     Status: Normal (Preliminary result)   Collection Time   04/30/11  6:59 AM      Component Value Range Status Comment   Specimen Description BLOOD LEFT HAND   Final    Special Requests BOTTLES DRAWN AEROBIC AND ANAEROBIC 6 CC EACH   Final    Culture PENDING   Incomplete    Report Status PENDING   Incomplete   CULTURE, BLOOD (ROUTINE X 2)     Status: Normal (Preliminary result)    Collection Time   04/30/11  7:00 AM      Component Value Range Status Comment   Specimen Description BLOOD LEFT ARM   Final    Special Requests     Final    Value: BOTTLES DRAWN AEROBIC AND ANAEROBIC 8 CC EACH BOTTLE   Culture PENDING   Incomplete    Report Status PENDING   Incomplete     Studies/Results: Ct Abdomen Pelvis W Contrast  04/29/2011  *RADIOLOGY REPORT*  Clinical Data: 50 year old female with abdominal and pelvic pain.  CT ABDOMEN AND PELVIS WITH CONTRAST  Technique:  Multidetector CT imaging of the abdomen and pelvis was performed following the standard protocol during bolus administration of intravenous contrast.  Contrast: OMNIPAQUE IOHEXOL 300 MG/ML IV SOLN  Comparison: None  Findings: Mild fatty infiltration of the liver is identified. The spleen, adrenal glands, gallbladder, pancreas and kidneys are unremarkable.  Mild gastric distention is identified with oral contrast is noted within the small bowel. No other bowel abnormalities are identified. The appendix is normal.  No free fluid, enlarged lymph nodes, biliary dilation or abdominal aortic aneurysm identified.  The bladder is distended. Small uterine fibroids are identified. There is no evidence of abscess or pneumoperitoneum.  No acute or suspicious bony abnormalities are noted.  IMPRESSION: Mild gastric distention without evidence of high-grade obstruction.  Bladder distention.  Mild fatty infiltration of the liver.  Small uterine fibroids.  Original Report Authenticated By: Rosendo Gros, M.D.    Medications: Scheduled Meds:   . enoxaparin  40 mg Subcutaneous Q24H  . HYDROmorphone  1 mg Intramuscular Once  . potassium chloride  10 mEq Intravenous Q1H  . sodium chloride  1,000 mL Intravenous Once  . sodium chloride  2,000 mL Intravenous Once  . DISCONTD: ondansetron  4 mg Oral Once   Continuous Infusions:   . sodium chloride    . sodium chloride    . dextrose 5 % and 0.45% NaCl    . insulin (NOVOLIN-R)  infusion    . DISCONTD: sodium chloride 125 mL/hr at 04/30/11 0624  . DISCONTD: dextrose 5 % and 0.45% NaCl    . DISCONTD: insulin (NOVOLIN-R) infusion 5.4 Units/hr (04/30/11 0621)   PRN Meds:.dextrose, ondansetron (ZOFRAN) IV, DISCONTD: dextrose  Assessment/Plan:  Active Problems:  DKA (diabetic ketoacidoses)  Metabolic encephalopathy  Lethargy  #1 DKA: Precipitating factor does not appear to be an infectious source, instead it would seem to me that she is likely a type I diabetic and has not been adequately treated. Continue DKA protocol. #2 metabolic encephalopathy: Secondary to DKA, improving.   LOS: 0 days   HERNANDEZ ACOSTA,ESTELA 04/30/2011, 9:11 AM

## 2011-04-30 NOTE — ED Notes (Signed)
INSULIN DRIP INCREASED TO 16.1 U/HR PER GLUCOSE STABILIZER.

## 2011-04-30 NOTE — ED Provider Notes (Addendum)
History     CSN: [id number] Arrival date & time: 04/30/2011  3:12 AM  Chief Complaint  Patient presents with  . Emesis    (Consider location/radiation/quality/duration/timing/severity/associated sxs/prior treatment) HPI Comments: Seen 0401. Patient seen yesterday for abdominal pain who had an unremarkable CT, here with continued pain, nausea and vomiting. She did not get her prescriptions filled and had no medicines as home. Pain is to abdomen, began 04/23/11 and worsened today. Denies fever, chills, weight loss.   Patient is a 50 y.o. female presenting with vomiting. The history is provided by the patient.  Emesis  This is a recurrent (Patient seen in the ER yesterday for abdominal pain since 04/23/11. Now with continued pain, nausea, vomiting.) problem. The problem occurs 2 to 4 times per day. The problem has been gradually worsening. The emesis has an appearance of bilious material. There has been no fever. Associated symptoms include abdominal pain, diarrhea and sweats. Pertinent negatives include no chills, no fever and no headaches.    Past Medical History  Diagnosis Date  . Diabetes mellitus   . Depression     Past Surgical History  Procedure Date  . Knee surgery   . Cystectomy   . Shoulder surgery     right  . Endometrial ablation     Family History  Problem Relation Age of Onset  . Hypertension Mother   . Cancer Mother   . Asthma Sister   . Asthma Brother     History  Substance Use Topics  . Smoking status: Former Smoker -- 0.5 packs/day for 15 years    Types: Cigarettes    Quit date: 04/29/1991  . Smokeless tobacco: Never Used  . Alcohol Use: 0.6 oz/week    1 Glasses of wine per week    OB History    Grav Para Term Preterm Abortions TAB SAB Ect Mult Living   5 4 4  1  1   4       Review of Systems  Constitutional: Negative for fever and chills.  Gastrointestinal: Positive for vomiting, abdominal pain and diarrhea.  Genitourinary: Negative for  difficulty urinating.  Neurological: Negative for headaches.  All other systems reviewed and are negative.    Allergies  Review of patient's allergies indicates no known allergies.  Home Medications   Current Outpatient Rx  Name Route Sig Dispense Refill  . CYCLOBENZAPRINE HCL 10 MG PO TABS Oral Take 10 mg by mouth daily.      Marland Kitchen DIAZEPAM 10 MG PO TABS Oral Take 10 mg by mouth as needed.      . DULOXETINE HCL 60 MG PO CPEP Oral Take 60 mg by mouth daily.      . INSULIN GLARGINE 100 UNIT/ML [place] SOLN Subcutaneous Inject 20 Units into the skin at bedtime.     . INSULIN LISPRO (HUMAN) 100 UNIT/ML [place] SOLN Subcutaneous Inject 0-15 Units into the skin 3 (three) times daily before meals. Sliding scale used based on BG levels    . METFORMIN HCL 1000 MG PO TABS Oral Take 1,000 mg by mouth 2 (two) times daily with a meal.      . ONDANSETRON HCL 4 MG PO TABS Oral Take 1 tablet (4 mg total) by mouth every 6 (six) hours. 12 tablet 0  . OXYCODONE-ACETAMINOPHEN 10-500 MG PO TABS Oral Take 1 tablet by mouth every 6 (six) hours as needed.      Marland Kitchen PANTOPRAZOLE SODIUM 20 MG PO TBEC Oral Take 2 tablets (40 mg total) by  mouth daily. 10 tablet 1  . TRAZODONE HCL 50 MG PO TABS Oral Take 50 mg by mouth at bedtime.      Marland Kitchen UNKNOWN TO PATIENT  Med for menopause       BP 117/59  Pulse 109  Temp(Src) 97.8 F (36.6 C) (Oral)  Resp 16  SpO2 100%  Physical Exam  Nursing note and vitals reviewed. Constitutional: She is oriented to person, place, and time. She appears well-developed and well-nourished.  HENT:  Head: Normocephalic and atraumatic.  Mouth/Throat: Oropharynx is clear and moist.  Eyes: EOM are normal.  Neck: Normal range of motion. Neck supple.  Cardiovascular: Normal heart sounds and intact distal pulses.        tachycardia  Pulmonary/Chest: Effort normal and breath sounds normal.  Abdominal: Soft. Bowel sounds are normal. She exhibits no distension. There is tenderness. There is no rebound and no  guarding.       Diffuse tenderness with no focal area of increased tenderness.  Genitourinary:       No cva tenderness  Musculoskeletal: Normal range of motion.  Neurological: She is alert and oriented to person, place, and time.  Skin: Skin is warm and dry.    ED Course  CRITICAL CARE Performed by: Annamarie Dawley Authorized by: Annamarie Dawley Total critical care time: 45 minutes Critical care was necessary to treat or prevent imminent or life-threatening deterioration of the following conditions: metabolic crisis. Critical care was time spent personally by me on the following activities: blood draw for specimens, discussions with consultants, examination of patient, evaluation of patient's response to treatment, obtaining history from patient or surrogate, ordering and performing treatments and interventions, ordering and review of laboratory studies, pulse oximetry, re-evaluation of patient's condition and review of old charts. Comments: Patient placed on glucose stabilizer   (including critical care time)   Ct Abdomen Pelvis W Contrast  04/29/2011  *RADIOLOGY REPORT*  Clinical Data: 50 year old female with abdominal and pelvic pain.  CT ABDOMEN AND PELVIS WITH CONTRAST  Technique:  Multidetector CT imaging of the abdomen and pelvis was performed following the standard protocol during bolus administration of intravenous contrast.  Contrast: OMNIPAQUE IOHEXOL 300 MG/ML IV SOLN  Comparison: None  Findings: Mild fatty infiltration of the liver is identified. The spleen, adrenal glands, gallbladder, pancreas and kidneys are unremarkable.  Mild gastric distention is identified with oral contrast is noted within the small bowel. No other bowel abnormalities are identified. The appendix is normal.  No free fluid, enlarged lymph nodes, biliary dilation or abdominal aortic aneurysm identified.  The bladder is distended. Small uterine fibroids are identified. There is no evidence of abscess  or pneumoperitoneum.  No acute or suspicious bony abnormalities are noted.  IMPRESSION: Mild gastric distention without evidence of high-grade obstruction.  Bladder distention.  Mild fatty infiltration of the liver.  Small uterine fibroids.  Original Report Authenticated By: Rosendo Gros, M.D.        MDM  Patient with abdominal pain, nausea and vomiting. Seen in the last 24 hours in the ER for the same. Had not filled prescriptions. Given analgesic and antiemetic here with improvement. Pt feels improved after observation and/or treatment in ED.Pt stable in ED with no significant deterioration in condition. MDM Reviewed: previous chart, nursing note and vitals Reviewed previous: labs and CT scan  0550 Patient was ready for discharge when lab called with critical values including a low CO2  (6) and glucose of 623, indicating a DKA. Remainder of  BMEt labs confirm DKA; K 5.6, BUN 41, Cr 1.44. IVF begun. Glucose stabilizer begun. ABG drawn. Plan for admission.  Results for orders placed during the hospital encounter of 04/30/11  CBC      Component Value Range   WBC 15.6 (*) 4.0 - 10.5 (K/uL)   RBC 4.14  3.87 - 5.11 (MIL/uL)   Hemoglobin 12.6  12.0 - 15.0 (g/dL)   HCT 40.9  81.1 - 91.4 (%)   MCV 98.8  78.0 - 100.0 (fL)   MCH 30.4  26.0 - 34.0 (pg)   MCHC 30.8  30.0 - 36.0 (g/dL)   RDW 78.2  95.6 - 21.3 (%)   Platelets 475 (*) 150 - 400 (K/uL)  BASIC METABOLIC PANEL      Component Value Range   Sodium 133 (*) 135 - 145 (mEq/L)   Potassium 5.6 (*) 3.5 - 5.1 (mEq/L)   Chloride 90 (*) 96 - 112 (mEq/L)   CO2 6 (*) 19 - 32 (mEq/L)   Glucose, Bld 693 (*) 70 - 99 (mg/dL)   BUN 41 (*) 6 - 23 (mg/dL)   Creatinine, Ser 0.86 (*) 0.50 - 1.10 (mg/dL)   Calcium 57.8  8.4 - 10.5 (mg/dL)   GFR calc non Af Amer 42 (*) >90 (mL/min)   GFR calc Af Amer 48 (*) >90 (mL/min)    4696 Spoke with Dr. Orvan Falconer, hospitalist. He has accepted patient for admission.The patient appears reasonably stabilized for  admission considering the current resources, flow, and capabilities available in the ED at this time, and I doubt any other [facility] requiring further screening and/or treatment in the ED prior to admission.    Nicoletta Dress. Colon Branch, MD 04/30/11 [phone] [id number] Dr. Orvan Falconer at the bedside. Advised him of ABG results. PH 6.97, PCO2 12.9, PO2 127, Bicarb 2.8  Nicoletta Dress. Colon Branch, MD 04/30/11 2440  Nicoletta Dress. Colon Branch, MD 05/19/11 [phone]

## 2011-04-30 NOTE — ED Notes (Addendum)
Lab unable to do acetone level due to no tabs. Dr.Campbell aware.

## 2011-04-30 NOTE — ED Notes (Signed)
Insulin drip increased to 10.8u/hr per glucose stabilizer.

## 2011-05-01 LAB — GLUCOSE, CAPILLARY
Glucose-Capillary: 141 mg/dL — ABNORMAL HIGH (ref 70–99)
Glucose-Capillary: 300 mg/dL — ABNORMAL HIGH (ref 70–99)
Glucose-Capillary: 94 mg/dL (ref 70–99)

## 2011-05-01 LAB — BASIC METABOLIC PANEL
BUN: 19 mg/dL (ref 6–23)
CO2: 20 mEq/L (ref 19–32)
Calcium: 8 mg/dL — ABNORMAL LOW (ref 8.4–10.5)
Chloride: 109 mEq/L (ref 96–112)
Creatinine, Ser: 0.8 mg/dL (ref 0.50–1.10)
GFR calc non Af Amer: 79 mL/min — ABNORMAL LOW (ref >90)
Potassium: 3.6 mEq/L (ref 3.5–5.1)
Sodium: 136 mEq/L (ref 135–145)

## 2011-05-01 LAB — CBC
MCH: 31.4 pg (ref 26.0–34.0)
MCHC: 34.3 g/dL (ref 30.0–36.0)
Platelets: 338 10*3/uL (ref 150–400)
RBC: 3.69 MIL/uL — ABNORMAL LOW (ref 3.87–5.11)

## 2011-05-01 LAB — DIFFERENTIAL
Basophils Relative: 0 % (ref 0–1)
Eosinophils Absolute: 0 10*3/uL (ref 0.0–0.7)
Lymphs Abs: 0.7 10*3/uL (ref 0.7–4.0)
Neutrophils Relative %: 89 % — ABNORMAL HIGH (ref 43–77)

## 2011-05-01 MED ORDER — INSULIN ASPART 100 UNIT/ML ~~LOC~~ SOLN
4.0000 [IU] | Freq: Three times a day (TID) | SUBCUTANEOUS | Status: DC
  Administered 2011-05-01 – 2011-05-03 (×7): 4 [IU] via SUBCUTANEOUS

## 2011-05-01 MED ORDER — INSULIN GLARGINE 100 UNIT/ML ~~LOC~~ SOLN
60.0000 [IU] | Freq: Every day | SUBCUTANEOUS | Status: DC
  Administered 2011-05-01 – 2011-05-02 (×2): 60 [IU] via SUBCUTANEOUS

## 2011-05-01 MED ORDER — INSULIN ASPART 100 UNIT/ML ~~LOC~~ SOLN
0.0000 [IU] | Freq: Three times a day (TID) | SUBCUTANEOUS | Status: DC
  Administered 2011-05-01: 5 [IU] via SUBCUTANEOUS
  Administered 2011-05-01: 8 [IU] via SUBCUTANEOUS
  Administered 2011-05-01: 12 [IU] via SUBCUTANEOUS
  Administered 2011-05-02: 3 [IU] via SUBCUTANEOUS
  Administered 2011-05-02: 5 [IU] via SUBCUTANEOUS
  Administered 2011-05-02: 2 [IU] via SUBCUTANEOUS
  Administered 2011-05-03: 11 [IU] via SUBCUTANEOUS
  Filled 2011-05-01: qty 3

## 2011-05-01 MED ORDER — OXYCODONE-ACETAMINOPHEN 5-325 MG PO TABS
1.0000 | ORAL_TABLET | ORAL | Status: DC | PRN
  Administered 2011-05-03: 1 via ORAL
  Filled 2011-05-01: qty 1

## 2011-05-01 MED ORDER — ACETAMINOPHEN 325 MG PO TABS
650.0000 mg | ORAL_TABLET | ORAL | Status: DC | PRN
  Administered 2011-05-01 – 2011-05-02 (×4): 650 mg via ORAL
  Filled 2011-05-01 (×3): qty 2

## 2011-05-01 MED ORDER — ACETAMINOPHEN 325 MG PO TABS
ORAL_TABLET | ORAL | Status: AC
  Administered 2011-05-01: 650 mg via ORAL
  Filled 2011-05-01: qty 2

## 2011-05-01 MED ORDER — SODIUM CHLORIDE 0.9 % IV SOLN
INTRAVENOUS | Status: DC
  Administered 2011-05-01: 75 mL via INTRAVENOUS
  Administered 2011-05-02: 08:00:00 via INTRAVENOUS

## 2011-05-01 MED ORDER — TRAZODONE HCL 50 MG PO TABS
25.0000 mg | ORAL_TABLET | Freq: Every evening | ORAL | Status: DC | PRN
  Administered 2011-05-01 – 2011-05-02 (×2): 50 mg via ORAL
  Filled 2011-05-01 (×2): qty 1

## 2011-05-01 MED ORDER — SODIUM CHLORIDE 0.9 % IJ SOLN
INTRAMUSCULAR | Status: AC
  Administered 2011-05-01: 10 mL
  Filled 2011-05-01: qty 10

## 2011-05-01 MED ORDER — INSULIN ASPART 100 UNIT/ML ~~LOC~~ SOLN
0.0000 [IU] | Freq: Every day | SUBCUTANEOUS | Status: DC
  Administered 2011-05-01: 3 [IU] via SUBCUTANEOUS
  Administered 2011-05-02: 4 [IU] via SUBCUTANEOUS

## 2011-05-01 NOTE — Progress Notes (Signed)
Paged Dr. Orvan Falconer per pt's request for something stronger than tylenol for her c/o "pounding headache and achy all over".  Per MD, will not order anything stronger at this time.  Let pt have breakfast, be re-assessed by AM rounding MD, and then they can decide if needed.  Explained this to pt who is agreeable with this plan at this time.

## 2011-05-01 NOTE — Progress Notes (Signed)
Subjective: Feels much better today. Is now off glucose stabilizer protocol. Complains of a sore throat.  Objective: Vital signs in last 24 hours: Temp:  [98.1 F (36.7 C)-99.7 F (37.6 C)] 98.1 F (36.7 C) (10/09 0400) Pulse Rate:  [88-112] 92  (10/09 0600) Resp:  [11-23] 14  (10/09 0600) BP: (95-117)/(50-68) 112/57 mmHg (10/09 0600) SpO2:  [95 %-100 %] 100 % (10/09 0600) Weight:  [72 kg (158 lb 11.7 oz)] 158 lb 11.7 oz (72 kg) (10/08 1006) Weight change:  Last BM Date: 04/29/11  Intake/Output from previous day: 10/08 0701 - 10/09 0700 In: 4285.6 [P.O.:840; I.V.:3043.6; IV Piggyback:402] Out: 3000 [Urine:3000]     Physical Exam: General: Alert, awake, oriented x3, in no acute distress. HEENT: No bruits, no goiter, no pharyngeal erythema or exudates. Heart: Regular rate and rhythm, without murmurs, rubs, gallops. Lungs: Clear to auscultation bilaterally. Abdomen: Soft, nontender, nondistended, positive bowel sounds. Extremities: No clubbing cyanosis or edema with positive pedal pulses. Neuro: Grossly intact, nonfocal.    Lab Results: Basic Metabolic Panel:  Basename 05/01/11 0640 05/01/11 0028  NA 136 136  K 4.2 3.6  CL 106 109  CO2 20 20  GLUCOSE 168* 135*  BUN 17 19  CREATININE 0.85 0.80  CALCIUM 8.0* 8.4  MG -- --  PHOS -- --   Liver Function Tests:  Basename 04/29/11 1143  AST 10  ALT 10  ALKPHOS 125*  BILITOT 0.6  PROT 7.1  ALBUMIN 4.0    Basename 04/29/11 1143  LIPASE 14  AMYLASE --   CBC:  Basename 05/01/11 0639 05/01/11 0300 04/30/11 0912 04/29/11 1143  WBC 13.0* -- 16.1* --  NEUTROABS -- 14.2* -- 6.5  HGB 11.6* -- 12.3 --  HCT 33.8* -- 39.4 --  MCV 91.6 -- 98.3 --  PLT 338 -- 427* --   CBG:  Basename 05/01/11 0748 05/01/11 0618 05/01/11 0333 05/01/11 0135 05/01/11 0027 04/30/11 2323  GLUCAP 214* 163* 94 131* 141* 142*    Recent Results (from the past 240 hour(s))  CULTURE, BLOOD (ROUTINE X 2)     Status: Normal (Preliminary  result)   Collection Time   04/30/11  6:59 AM      Component Value Range Status Comment   Specimen Description BLOOD LEFT HAND   Final    Special Requests BOTTLES DRAWN AEROBIC AND ANAEROBIC 6 CC EACH   Final    Culture NO GROWTH <24 HRS   Final    Report Status PENDING   Incomplete   CULTURE, BLOOD (ROUTINE X 2)     Status: Normal (Preliminary result)   Collection Time   04/30/11  7:00 AM      Component Value Range Status Comment   Specimen Description BLOOD LEFT ARM   Final    Special Requests     Final    Value: BOTTLES DRAWN AEROBIC AND ANAEROBIC 8 CC EACH BOTTLE   Culture NO GROWTH <24 HRS   Final    Report Status PENDING   Incomplete   MRSA PCR SCREENING     Status: Normal   Collection Time   04/30/11 12:53 PM      Component Value Range Status Comment   MRSA by PCR NEGATIVE  NEGATIVE  Final     Studies/Results: Ct Abdomen Pelvis W Contrast  04/29/2011  *RADIOLOGY REPORT*  Clinical Data: 50 year old female with abdominal and pelvic pain.  CT ABDOMEN AND PELVIS WITH CONTRAST  Technique:  Multidetector CT imaging of the abdomen and pelvis was  performed following the standard protocol during bolus administration of intravenous contrast.  Contrast: OMNIPAQUE IOHEXOL 300 MG/ML IV SOLN  Comparison: None  Findings: Mild fatty infiltration of the liver is identified. The spleen, adrenal glands, gallbladder, pancreas and kidneys are unremarkable.  Mild gastric distention is identified with oral contrast is noted within the small bowel. No other bowel abnormalities are identified. The appendix is normal.  No free fluid, enlarged lymph nodes, biliary dilation or abdominal aortic aneurysm identified.  The bladder is distended. Small uterine fibroids are identified. There is no evidence of abscess or pneumoperitoneum.  No acute or suspicious bony abnormalities are noted.  IMPRESSION: Mild gastric distention without evidence of high-grade obstruction.  Bladder distention.  Mild fatty infiltration  of the liver.  Small uterine fibroids.  Original Report Authenticated By: Rosendo Gros, M.D.    Medications: Scheduled Meds:   . enoxaparin  40 mg Subcutaneous Q24H  . insulin aspart  0-15 Units Subcutaneous TID WC  . insulin aspart  0-5 Units Subcutaneous QHS  . insulin aspart  4 Units Subcutaneous TID WC  . insulin glargine  40 Units Subcutaneous Once  . potassium chloride  10 mEq Intravenous Q1H   Continuous Infusions:   . sodium chloride    . DISCONTD: sodium chloride Stopped (04/30/11 1340)  . DISCONTD: dextrose 5 % and 0.45% NaCl Stopped (05/01/11 0200)  . DISCONTD: insulin (NOVOLIN-R) infusion Stopped (05/01/11 0215)   PRN Meds:.acetaminophen, dextrose, magic mouthwash, ondansetron (ZOFRAN) IV, DISCONTD: magic mouthwash w/lidocaine  Assessment/Plan:  Active Problems:  DKA (diabetic ketoacidoses)  Metabolic encephalopathy  Lethargy  #1 diabetic ketoacidosis: Resolved. Her gap is 10 this morning with a bicarbonate of 20. She has been taken off glucose stabilizer protocol and has been transitioned over to subcutaneous Lantus and sliding scale. I suspect that she is likely a type I diabetic and has actually been undertreated, this is her reason for DKA. Will have the diabetes coordinator see her while in the hospital for some diabetes education. We'll need close followup with PCP at time of discharge. #2 toxic metabolic encephalopathy: Secondary to DKA, resolved. #3 disposition: We'll transfer to a regular bed today to continue diabetes education and will aim for discharge home in the morning.   LOS: 1 day   HERNANDEZ ACOSTA,Deval Mroczka 05/01/2011, 9:26 AM

## 2011-05-02 ENCOUNTER — Inpatient Hospital Stay (HOSPITAL_COMMUNITY): Payer: Medicaid Other

## 2011-05-02 DIAGNOSIS — Z9189 Other specified personal risk factors, not elsewhere classified: Secondary | ICD-10-CM | POA: Diagnosis present

## 2011-05-02 DIAGNOSIS — J029 Acute pharyngitis, unspecified: Secondary | ICD-10-CM | POA: Diagnosis not present

## 2011-05-02 DIAGNOSIS — E876 Hypokalemia: Secondary | ICD-10-CM | POA: Diagnosis not present

## 2011-05-02 DIAGNOSIS — N939 Abnormal uterine and vaginal bleeding, unspecified: Secondary | ICD-10-CM | POA: Diagnosis not present

## 2011-05-02 LAB — BASIC METABOLIC PANEL
BUN: 13 mg/dL (ref 6–23)
BUN: 10 mg/dL (ref 6–23)
Calcium: 8.3 mg/dL — ABNORMAL LOW (ref 8.4–10.5)
Calcium: 8.5 mg/dL (ref 8.4–10.5)
Chloride: 109 mEq/L (ref 96–112)
Creatinine, Ser: 0.59 mg/dL (ref 0.50–1.10)
GFR calc non Af Amer: >90 mL/min (ref >90)
GFR calc non Af Amer: >90 mL/min (ref >90)
Glucose, Bld: 278 mg/dL — ABNORMAL HIGH (ref 70–99)
Glucose, Bld: 198 mg/dL — ABNORMAL HIGH (ref 70–99)
Sodium: 141 mEq/L (ref 135–145)

## 2011-05-02 LAB — CBC
HCT: 31.6 % — ABNORMAL LOW (ref 36.0–46.0)
Hemoglobin: 10.5 g/dL — ABNORMAL LOW (ref 12.0–15.0)
MCH: 30.3 pg (ref 26.0–34.0)
MCHC: 33.2 g/dL (ref 30.0–36.0)
MCV: 91.1 fL (ref 78.0–100.0)
Platelets: 307 10*3/uL (ref 150–400)
RBC: 3.47 MIL/uL — ABNORMAL LOW (ref 3.87–5.11)

## 2011-05-02 MED ORDER — MENTHOL 3 MG MT LOZG
1.0000 | LOZENGE | OROMUCOSAL | Status: DC | PRN
  Filled 2011-05-02: qty 9

## 2011-05-02 MED ORDER — POTASSIUM CHLORIDE CRYS ER 20 MEQ PO TBCR
20.0000 meq | EXTENDED_RELEASE_TABLET | Freq: Two times a day (BID) | ORAL | Status: DC
  Administered 2011-05-02 – 2011-05-03 (×3): 20 meq via ORAL
  Filled 2011-05-02 (×3): qty 1

## 2011-05-02 NOTE — Progress Notes (Addendum)
Subjective: C/o uterine bleeding. No vaginal discharge. Denies pelvic pain. Had uterine ablation 4 yrs ago. She continues with sore throat and swollen LN in her neck. Afebrile.  Objective: Vital signs in last 24 hours: Temp:  [97.4 F (36.3 C)-99 F (37.2 C)] 99 F (37.2 C) (10/10 0900) Pulse Rate:  [88-97] 88  (10/10 0900) Resp:  [15-20] 20  (10/10 0900) BP: (110-158)/(65-86) 124/75 mmHg (10/10 0900) SpO2:  [96 %-100 %] 99 % (10/10 0900) Weight change:  Last BM Date: 04/30/11  Intake/Output from previous day: 10/09 0701 - 10/10 0700 In: 526.3 [P.O.:240; I.V.:286.3] Out: 300 [Urine:300]     Physical Exam: General: Alert, awake, oriented x3, in no acute distress. HEENT: anterior cervical LAN, tender. Oropharynx: no erythema or exudates Heart: Regular rate and rhythm, without murmurs, rubs, gallops. Lungs: Clear to auscultation bilaterally. Abdomen: Soft, nontender, nondistended, positive bowel sounds. Extremities: No clubbing cyanosis or edema with positive pedal pulses. Neuro: Grossly intact, nonfocal.    Lab Results: Basic Metabolic Panel:  Basename 05/02/11 0535 05/01/11 2309  NA 141 134*  K 3.1* 3.0*  CL 109 102  CO2 25 24  GLUCOSE 198* 278*  BUN 10 13  CREATININE 0.49* 0.59  CALCIUM 8.5 8.3*  MG -- --  PHOS -- --   Liver Function Tests:  Basename 04/29/11 1143  AST 10  ALT 10  ALKPHOS 125*  BILITOT 0.6  PROT 7.1  ALBUMIN 4.0    Basename 04/29/11 1143  LIPASE 14  AMYLASE --   No results found for this basename: AMMONIA:2 in the last 72 hours CBC:  Basename 05/02/11 0535 05/01/11 0639 05/01/11 0300 04/29/11 1143  WBC 6.3 13.0* -- --  NEUTROABS -- -- 14.2* 6.5  HGB 10.5* 11.6* -- --  HCT 31.6* 33.8* -- --  MCV 91.1 91.6 -- --  PLT 307 338 -- --   Cardiac Enzymes: No results found for this basename: CKTOTAL:3,CKMB:3,CKMBINDEX:3,TROPONINI:3 in the last 72 hours BNP: No results found for this basename: POCBNP:3 in the last 72  hours D-Dimer: No results found for this basename: DDIMER:2 in the last 72 hours CBG:  Basename 05/02/11 0740 05/01/11 2149 05/01/11 1712 05/01/11 1144 05/01/11 0748 05/01/11 0618  GLUCAP 154* 285* 300* 264* 214* 163*   Hemoglobin A1C: No results found for this basename: HGBA1C in the last 72 hours Fasting Lipid Panel: No results found for this basename: CHOL,HDL,LDLCALC,TRIG,CHOLHDL,LDLDIRECT in the last 72 hours Thyroid Function Tests: No results found for this basename: TSH,T4TOTAL,FREET4,T3FREE,THYROIDAB in the last 72 hours Anemia Panel: No results found for this basename: VITAMINB12,FOLATE,FERRITIN,TIBC,IRON,RETICCTPCT in the last 72 hours Urine Drug Screen: Alcohol Level: No results found for this basename: ETH:2 in the last 72 hours Urinalysis: Misc. Labs: Recent Results (from the past 240 hour(s))  CULTURE, BLOOD (ROUTINE X 2)     Status: Normal (Preliminary result)   Collection Time   04/30/11  6:59 AM      Component Value Range Status Comment   Specimen Description BLOOD LEFT HAND   Final    Special Requests BOTTLES DRAWN AEROBIC AND ANAEROBIC 6 CC EACH   Final    Culture NO GROWTH 2 DAYS   Final    Report Status PENDING   Incomplete   CULTURE, BLOOD (ROUTINE X 2)     Status: Normal (Preliminary result)   Collection Time   04/30/11  7:00 AM      Component Value Range Status Comment   Specimen Description BLOOD LEFT ARM   Final  Special Requests     Final    Value: BOTTLES DRAWN AEROBIC AND ANAEROBIC 8 CC EACH BOTTLE   Culture NO GROWTH 2 DAYS   Final    Report Status PENDING   Incomplete   MRSA PCR SCREENING     Status: Normal   Collection Time   04/30/11 12:53 PM      Component Value Range Status Comment   MRSA by PCR NEGATIVE  NEGATIVE  Final     Studies/Results: No results found.  Medications: Scheduled Meds:   . enoxaparin  40 mg Subcutaneous Q24H  . insulin aspart  0-15 Units Subcutaneous TID WC  . insulin aspart  0-5 Units Subcutaneous QHS  .  insulin aspart  4 Units Subcutaneous TID WC  . insulin glargine  60 Units Subcutaneous QHS  . sodium chloride       Continuous Infusions:   . sodium chloride 75 mL/hr at 05/02/11 0812   PRN Meds:.acetaminophen, magic mouthwash, ondansetron (ZOFRAN) IV, oxyCODONE-acetaminophen, traZODone, DISCONTD: dextrose  Assessment/Plan:  Active Problems:  Pharyngitis: seems viral. Try cepacol lozenges, acetaminophen  Uterine bleeding. Obtain pelvic/tv US  Diabetes mellitus high risk. Better control.  Hypokalemia. Replace. Check bmp in am     LOS: 2 days   Jonny Ruiz 05/02/2011, 10:34 AM

## 2011-05-02 NOTE — Progress Notes (Addendum)
Patient admitting with DKA.  History of type 2 Diabetes.  Hospitalist feels that patient may be Type 1 DM.  I recommend ordering A1C and C-Peptide level to assess beta cell function.  Also order RD consult for Diet Education and follow-up with Outpatient Diabetes Education at [facility].  May also consider endocrinology consult by Dr. Fransico Him.    Will discuss with patient regarding taking insulin on daily basis and monitoring blood glucose.  Case management involved for medication assistance.

## 2011-05-02 NOTE — Progress Notes (Signed)
Patient IV cath infiltrated.Patient refused to let us put one back in. Spoke with MD Jonny Ruiz and he said to wait til tomorrow and he will speak wit patient. But leave out at this time.

## 2011-05-03 LAB — GLUCOSE, CAPILLARY
Glucose-Capillary: 307 mg/dL — ABNORMAL HIGH (ref 70–99)
Glucose-Capillary: 71 mg/dL (ref 70–99)
Glucose-Capillary: 53 mg/dL — ABNORMAL LOW (ref 70–99)
Glucose-Capillary: 91 mg/dL (ref 70–99)

## 2011-05-03 LAB — BASIC METABOLIC PANEL
CO2: 29 mEq/L (ref 19–32)
Calcium: 8.8 mg/dL (ref 8.4–10.5)
Glucose, Bld: 46 mg/dL — ABNORMAL LOW (ref 70–99)
Sodium: 143 mEq/L (ref 135–145)

## 2011-05-03 MED ORDER — POTASSIUM CHLORIDE CRYS ER 20 MEQ PO TBCR: 20.0000 meq | EXTENDED_RELEASE_TABLET | Freq: Two times a day (BID) | ORAL | Status: DC

## 2011-05-03 MED ORDER — FERROUS SULFATE 325 (65 FE) MG PO TABS: 325.0000 mg | ORAL_TABLET | Freq: Three times a day (TID) | ORAL | Status: DC

## 2011-05-03 MED ORDER — GLUCOSE-VITAMIN C 4-6 GM-MG PO CHEW
3.0000 | CHEWABLE_TABLET | Freq: Once | ORAL | Status: DC | PRN
  Filled 2011-05-03: qty 3

## 2011-05-03 MED ORDER — GLUCOSE 40 % PO GEL
ORAL | Status: AC
  Administered 2011-05-03: 37.5 g
  Filled 2011-05-03: qty 0.83

## 2011-05-03 MED ORDER — GLUCOSE 40 % PO GEL: 1.0000 | Freq: Once | ORAL | Status: DC | PRN

## 2011-05-03 NOTE — Discharge Summary (Addendum)
Physician Discharge Summary  Patient ID: [name] MRN: [id number] DOB/AGE: 51-Mar-1962 51 y.o.  Admit date: 04/30/2011 Discharge date: 05/03/2011  Primary Care Physician:  Colette Ribas, MD   Discharge Diagnoses:    Active Problems:  Pharyngitis  Uterine bleeding  Diabetes mellitus high risk  Hypokalemia    Present on Admission:  .DKA (diabetic ketoacidoses) .Metabolic encephalopathy .Lethargy .Diabetes mellitus high risk  Current Discharge Medication List    START taking these medications   Details  ferrous sulfate (FERROUSUL) 325 (65 FE) MG tablet Take 1 tablet (325 mg total) by mouth 3 (three) times daily with meals. Qty: 30 tablet, Refills: 2    potassium chloride SA (K-DUR,KLOR-CON) 20 MEQ tablet Take 1 tablet (20 mEq total) by mouth 2 (two) times daily. Qty: 4 tablet, Refills: 0      CONTINUE these medications which have NOT CHANGED   Details  cyclobenzaprine (FLEXERIL) 10 MG tablet Take 10 mg by mouth daily.      diazepam (VALIUM) 10 MG tablet Take 10 mg by mouth as needed.     DULoxetine (CYMBALTA) 60 MG capsule Take 60 mg by mouth daily.      insulin glargine (LANTUS) 100 UNIT/ML injection Inject 60 Units into the skin at bedtime.      insulin lispro (HUMALOG) 100 UNIT/ML injection Inject 0-15 Units into the skin 3 (three) times daily before meals. Sliding scale used based on BG levels    metFORMIN (GLUCOPHAGE) 1000 MG tablet Take 1,000 mg by mouth 2 (two) times daily with a meal.      oxycodone-acetaminophen (NARVOX) 10-500 MG per tablet Take 1 tablet by mouth every 6 (six) hours as needed.      pantoprazole (PROTONIX) 20 MG tablet Take 2 tablets (40 mg total) by mouth daily. Qty: 10 tablet, Refills: 1    traZODone (DESYREL) 50 MG tablet Take 50 mg by mouth at bedtime.        STOP taking these medications     ondansetron (ZOFRAN) 4 MG tablet      UNKNOWN TO PATIENT          Disposition and Follow-up: Home. F/U GYN today  at 10:30 am Consults: N/A  Significant Diagnostic Studies:  Transvaginal US;  IMPRESSION:  1. Endometrial thickening in a postmenopausal patient can be seen  with endometrial hyperplasia, endometrial polyp or endometrial  carcinoma.  2. Uterine fibroids.  Original Report Authenticated By: Reyes Ivan, M.D.       Brief H and P: For complete details please refer to admission H and P, but in brief [name] is an 50 y.o. female. With DM maintained on Lantus and metformin, who present sto the hospital for the second time in 2 days with V+D now lethargic and unable to give a clear history.  Initial labs were severely deranged and hosptialist was called to assist.  Patient his unable to contribute much to history becuase of altererd mentation and family have just left.  Admits of fever.  Examination at discharge: General: Alert, awake, oriented x3, in no acute distress. HEENT: No bruits, no goiter. Heart: Regular rate and rhythm, without murmurs, rubs, gallops. Lungs: Clear to auscultation bilaterally. Abdomen: Soft, nontender, nondistended, positive bowel sounds. Extremities: No clubbing cyanosis or edema with positive pedal pulses. Neuro: Grossly intact, nonfocal.  Results for orders placed during the hospital encounter of 04/30/11 (from the past 24 hour(s))  GLUCOSE, CAPILLARY     Status: Abnormal   Collection Time   05/02/11  11:24 AM      Component Value Range   Glucose-Capillary 138 (*) 70 - 99 (mg/dL)  GLUCOSE, CAPILLARY     Status: Abnormal   Collection Time   05/02/11  4:54 PM      Component Value Range   Glucose-Capillary 237 (*) 70 - 99 (mg/dL)  GLUCOSE, CAPILLARY     Status: Abnormal   Collection Time   05/02/11  9:41 PM      Component Value Range   Glucose-Capillary 307 (*) 70 - 99 (mg/dL)  BASIC METABOLIC PANEL     Status: Abnormal   Collection Time   05/03/11  6:21 AM      Component Value Range   Sodium 143  135 - 145 (mEq/L)   Potassium  3.1 (*) 3.5 - 5.1 (mEq/L)   Chloride 110  96 - 112 (mEq/L)   CO2 29  19 - 32 (mEq/L)   Glucose, Bld 46 (*) 70 - 99 (mg/dL)   BUN 8  6 - 23 (mg/dL)   Creatinine, Ser <8.11 (*) 0.50 - 1.10 (mg/dL)   Calcium 8.8  8.4 - 91.4 (mg/dL)   GFR calc non Af Amer NOT CALCULATED  >90 (mL/min)   GFR calc Af Amer NOT CALCULATED  >90 (mL/min)       Hospital Course:  Active Problems:  Pharyngitis. Patient developed sore throat and lymphadenopathy on third day of admission. Examination revealed just erythema. She was treated conservatively with Cepacol lozenges and Tylenol  Uterine bleeding. The patient complained of several days of menorrhagia and she underwent an ultrasound. Report above. She was found with thickened endometrium and uterine fibroids. She is to follow up with her GYN today at 10:30 AM  Diabetes mellitus high risk. Patient was admitted in DKA and was managed with IV fluids and insulin appropriately. She had an uncomplicated course and became stable. She was switched to basal insulin with Lantus as well as insulin sliding scale.  Hypokalemia. She was noted to be hypokalemic with a potassium of 3.1. She was given oral potassium and because at discharge she was still hypokalemic she is advised to continue taking potassium for 4 more doses.   Time spent on Discharge: 30 min  Signed: Jonny Ruiz 05/03/2011, 8:28 AM

## 2011-05-03 NOTE — Progress Notes (Signed)
CBG: 53  Treatment: Glucose gel, orange juice, patient ate lunch  Symptoms: Weakness  Follow-up CBG: Time:1220 CBG Result:93  Possible Reasons for Event: Unknown  Comments/MD notified: N/A    Wilford Corner

## 2011-05-03 NOTE — Progress Notes (Signed)
Discharge instructions and prescriptions given, verbalized understanding, out ambulatory in stable condition with staff. 

## 2011-05-05 LAB — CULTURE, BLOOD (ROUTINE X 2)
Culture: NO GROWTH
Culture: NO GROWTH

## 2011-05-07 LAB — URINALYSIS, ROUTINE W REFLEX MICROSCOPIC
Glucose, UA: 100 — AB
Ketones, ur: NEGATIVE
Nitrite: NEGATIVE
Protein, ur: NEGATIVE
pH: 6

## 2011-05-07 LAB — DIFFERENTIAL
Basophils Absolute: 0
Eosinophils Absolute: 0.2
Lymphocytes Relative: 29
Lymphs Abs: 2.1
Neutrophils Relative %: 58

## 2011-05-07 LAB — BASIC METABOLIC PANEL
BUN: 23
Creatinine, Ser: 0.76
GFR calc non Af Amer: >60
Glucose, Bld: 61 — ABNORMAL LOW

## 2011-05-07 LAB — CBC
Platelets: 420 — ABNORMAL HIGH
RDW: 15.5 — ABNORMAL HIGH
WBC: 7.3

## 2011-06-25 ENCOUNTER — Telehealth: Payer: Self-pay

## 2011-06-25 NOTE — Telephone Encounter (Signed)
LMOM on 06/22/2011 to call and let me know if she wants colonoscopy before end of year or first of year.

## 2011-06-27 NOTE — Telephone Encounter (Signed)
Letter mailed for pt to call.  

## 2011-08-01 ENCOUNTER — Telehealth: Payer: Self-pay

## 2011-08-01 NOTE — Telephone Encounter (Signed)
LMOM for pt to call and schedule colonoscopy. 

## 2011-08-01 NOTE — Telephone Encounter (Signed)
Letter mailed to call.  

## 2011-09-08 ENCOUNTER — Emergency Department (HOSPITAL_COMMUNITY)
Admission: EM | Admit: 2011-09-08 | Discharge: 2011-09-08 | Disposition: A | Discharge status: Home / Self Care | Payer: Medicaid Other | Attending: Emergency Medicine | Admitting: Emergency Medicine

## 2011-09-08 ENCOUNTER — Encounter (HOSPITAL_COMMUNITY): Payer: Self-pay | Admitting: *Deleted

## 2011-09-08 DIAGNOSIS — F329 Major depressive disorder, single episode, unspecified: Secondary | ICD-10-CM | POA: Insufficient documentation

## 2011-09-08 DIAGNOSIS — Z79899 Other long term (current) drug therapy: Secondary | ICD-10-CM | POA: Insufficient documentation

## 2011-09-08 DIAGNOSIS — G8929 Other chronic pain: Secondary | ICD-10-CM | POA: Insufficient documentation

## 2011-09-08 DIAGNOSIS — R10819 Abdominal tenderness, unspecified site: Secondary | ICD-10-CM | POA: Insufficient documentation

## 2011-09-08 DIAGNOSIS — F3289 Other specified depressive episodes: Secondary | ICD-10-CM | POA: Insufficient documentation

## 2011-09-08 DIAGNOSIS — E119 Type 2 diabetes mellitus without complications: Secondary | ICD-10-CM | POA: Insufficient documentation

## 2011-09-08 DIAGNOSIS — M25559 Pain in unspecified hip: Secondary | ICD-10-CM | POA: Insufficient documentation

## 2011-09-08 DIAGNOSIS — M79609 Pain in unspecified limb: Secondary | ICD-10-CM | POA: Insufficient documentation

## 2011-09-08 DIAGNOSIS — R109 Unspecified abdominal pain: Secondary | ICD-10-CM | POA: Insufficient documentation

## 2011-09-08 DIAGNOSIS — Z794 Long term (current) use of insulin: Secondary | ICD-10-CM | POA: Insufficient documentation

## 2011-09-08 LAB — DIFFERENTIAL
Basophils Absolute: 0 10*3/uL (ref 0.0–0.1)
Basophils Relative: 0 % (ref 0–1)
Lymphocytes Relative: 38 % (ref 12–46)
Monocytes Absolute: 0.3 10*3/uL (ref 0.1–1.0)
Neutro Abs: 1.5 10*3/uL — ABNORMAL LOW (ref 1.7–7.7)
Neutrophils Relative %: 50 % (ref 43–77)

## 2011-09-08 LAB — URINE MICROSCOPIC-ADD ON

## 2011-09-08 LAB — URINALYSIS, ROUTINE W REFLEX MICROSCOPIC
Glucose, UA: >1000 mg/dL — AB
Ketones, ur: NEGATIVE mg/dL
Leukocytes, UA: NEGATIVE
Nitrite: NEGATIVE
Specific Gravity, Urine: 1.015 (ref 1.005–1.030)
pH: 7 (ref 5.0–8.0)

## 2011-09-08 LAB — BASIC METABOLIC PANEL
CO2: 28 mEq/L (ref 19–32)
Chloride: 105 mEq/L (ref 96–112)
Creatinine, Ser: 0.6 mg/dL (ref 0.50–1.10)
GFR calc Af Amer: >90 mL/min (ref >90)
Potassium: 3.8 mEq/L (ref 3.5–5.1)
Sodium: 139 mEq/L (ref 135–145)

## 2011-09-08 LAB — CBC
HCT: 38.8 % (ref 36.0–46.0)
MCHC: 33.2 g/dL (ref 30.0–36.0)
Platelets: 402 10*3/uL — ABNORMAL HIGH (ref 150–400)
RDW: 13.2 % (ref 11.5–15.5)
WBC: 3.1 10*3/uL — ABNORMAL LOW (ref 4.0–10.5)

## 2011-09-08 MED ORDER — METHOCARBAMOL 500 MG PO TABS: ORAL_TABLET | ORAL | Status: DC

## 2011-09-08 MED ORDER — OXYCODONE-ACETAMINOPHEN 10-325 MG PO TABS: 1.0000 | ORAL_TABLET | ORAL | Status: AC | PRN

## 2011-09-08 MED ORDER — SODIUM CHLORIDE 0.9 % IV BOLUS (SEPSIS)
500.0000 mL | Freq: Once | INTRAVENOUS | Status: AC
  Administered 2011-09-08: 1000 mL via INTRAVENOUS

## 2011-09-08 MED ORDER — ONDANSETRON HCL 4 MG/2ML IJ SOLN
4.0000 mg | Freq: Once | INTRAMUSCULAR | Status: AC
  Administered 2011-09-08: 4 mg via INTRAVENOUS
  Filled 2011-09-08: qty 2

## 2011-09-08 MED ORDER — MORPHINE SULFATE 4 MG/ML IJ SOLN
4.0000 mg | Freq: Once | INTRAMUSCULAR | Status: AC
  Administered 2011-09-08: 4 mg via INTRAVENOUS
  Filled 2011-09-08: qty 1

## 2011-09-08 NOTE — ED Provider Notes (Signed)
History     CSN: [id number]  Arrival date & time 09/08/11  4098   First MD Initiated Contact with Patient 09/08/11 [phone]      Chief Complaint  Patient presents with  . Back Pain    (Consider location/radiation/quality/duration/timing/severity/associated sxs/prior treatment) HPI Comments: Patient complains of sudden onset of right flank pain that began at approximately 2 AM this morning.  She states the pain is sharp and stabbing and worse with deep breathing and certain movements. Pain improved with heat. She denies any radiation of pain to her abdomen or urinary symptoms. She also complains of chronic left hip and leg pain for one month. She denies any recent injury. She also denies any nausea vomiting, abdominal pain, chest pain, or shortness of breath.  She does state that she is a diabetic and her blood sugars have been elevated recently.  Patient is a 51 y.o. female presenting with flank pain. The history is provided by the patient. No language interpreter was used.  Flank Pain This is a new problem. The current episode started today (Approximately 7 hours ago). The problem occurs constantly. The problem has been unchanged. Pertinent negatives include no abdominal pain, change in bowel habit, chest pain, chills, congestion, fever, headaches, joint swelling, nausea, neck pain, numbness, sore throat, swollen glands, urinary symptoms, vomiting or weakness. The symptoms are aggravated by twisting (Movement and palpation). She has tried heat for the symptoms. The treatment provided moderate relief.    Past Medical History  Diagnosis Date  . Diabetes mellitus   . Depression     Past Surgical History  Procedure Date  . Knee surgery   . Cystectomy   . Shoulder surgery     right  . Endometrial ablation     Family History  Problem Relation Age of Onset  . Hypertension Mother   . Cancer Mother   . Asthma Sister   . Asthma Brother     History  Substance Use Topics  . Smoking  status: Former Smoker -- 0.5 packs/day for 15 years    Types: Cigarettes    Quit date: 04/29/1991  . Smokeless tobacco: Never Used  . Alcohol Use: No    OB History    Grav Para Term Preterm Abortions TAB SAB Ect Mult Living   5 4 4  1  1   4       Review of Systems  Constitutional: Negative for fever, chills, activity change and appetite change.  HENT: Negative for congestion, sore throat and neck pain.   Respiratory: Negative for chest tightness and shortness of breath.   Cardiovascular: Negative for chest pain, palpitations and leg swelling.  Gastrointestinal: Negative for nausea, vomiting, abdominal pain, diarrhea and change in bowel habit.  Genitourinary: Positive for flank pain. Negative for dysuria, urgency, frequency, hematuria and difficulty urinating.  Musculoskeletal: Positive for back pain. Negative for joint swelling.  Skin: Negative.   Neurological: Negative for dizziness, weakness, numbness and headaches.  All other systems reviewed and are negative.    Allergies  Review of patient's allergies indicates no known allergies.  Home Medications   Current Outpatient Rx  Name Route Sig Dispense Refill  . CYCLOBENZAPRINE HCL 10 MG PO TABS Oral Take 10 mg by mouth daily.      Marland Kitchen DIAZEPAM 10 MG PO TABS Oral Take 10 mg by mouth as needed.     . DULOXETINE HCL 60 MG PO CPEP Oral Take 60 mg by mouth daily.      Marland Kitchen FERROUS  SULFATE 325 (65 FE) MG PO TABS Oral Take 1 tablet (325 mg total) by mouth 3 (three) times daily with meals. 30 tablet 2  . INSULIN GLARGINE 100 UNIT/ML [place] SOLN Subcutaneous Inject 60 Units into the skin at bedtime.      . INSULIN LISPRO (HUMAN) 100 UNIT/ML [place] SOLN Subcutaneous Inject 0-15 Units into the skin 3 (three) times daily before meals. Sliding scale used based on BG levels    . METFORMIN HCL 1000 MG PO TABS Oral Take 1,000 mg by mouth 2 (two) times daily with a meal.      . OXYCODONE-ACETAMINOPHEN 10-500 MG PO TABS Oral Take 1 tablet by mouth every 6  (six) hours as needed.      Marland Kitchen PANTOPRAZOLE SODIUM 20 MG PO TBEC Oral Take 2 tablets (40 mg total) by mouth daily. 10 tablet 1  . POTASSIUM CHLORIDE CRYS ER 20 MEQ PO TBCR Oral Take 1 tablet (20 mEq total) by mouth 2 (two) times daily. 4 tablet 0  . TRAZODONE HCL 50 MG PO TABS Oral Take 50 mg by mouth at bedtime.        BP 155/77  Pulse 68  Resp 20  Ht 5' 5.5" (1.664 m)  Wt 175 lb (79.379 kg)  BMI 28.68 kg/m2  SpO2 99%  Physical Exam  Nursing note and vitals reviewed. Constitutional: She is oriented to person, place, and time. She appears well-developed and well-nourished. No distress.  HENT:  Head: Normocephalic and atraumatic.  Neck: Normal range of motion. Neck supple.  Cardiovascular: Normal rate, regular rhythm and normal heart sounds.   Pulmonary/Chest: Effort normal and breath sounds normal. No respiratory distress. She exhibits no tenderness.  Abdominal: Soft. She exhibits no distension and no mass. There is no tenderness. There is no rebound and no guarding.  Musculoskeletal: Normal range of motion. She exhibits no tenderness.       Back:       ttp of the right flank, no edema or crepitus, no spinal tenderness on exam  Lymphadenopathy:    She has no cervical adenopathy.  Neurological: She is alert and oriented to person, place, and time. No cranial nerve deficit or sensory deficit. She exhibits normal muscle tone. Coordination normal.  Reflex Scores:      Patellar reflexes are 2+ on the right side and 2+ on the left side.      Achilles reflexes are 2+ on the right side and 2+ on the left side. Skin: Skin is warm and dry.    ED Course  Procedures (including critical care time)  Results for orders placed during the hospital encounter of 09/08/11  CBC      Component Value Range   WBC 3.1 (*) 4.0 - 10.5 (K/uL)   RBC 4.19  3.87 - 5.11 (MIL/uL)   Hemoglobin 12.9  12.0 - 15.0 (g/dL)   HCT 16.1  09.6 - 04.5 (%)   MCV 92.6  78.0 - 100.0 (fL)   MCH 30.8  26.0 - 34.0 (pg)     MCHC 33.2  30.0 - 36.0 (g/dL)   RDW 40.9  81.1 - 91.4 (%)   Platelets 402 (*) 150 - 400 (K/uL)  DIFFERENTIAL      Component Value Range   Neutrophils Relative 50  43 - 77 (%)   Neutro Abs 1.5 (*) 1.7 - 7.7 (K/uL)   Lymphocytes Relative 38  12 - 46 (%)   Lymphs Abs 1.2  0.7 - 4.0 (K/uL)   Monocytes Relative 8  3 - 12 (%)   Monocytes Absolute 0.3  0.1 - 1.0 (K/uL)   Eosinophils Relative 3  0 - 5 (%)   Eosinophils Absolute 0.1  0.0 - 0.7 (K/uL)   Basophils Relative 0  0 - 1 (%)   Basophils Absolute 0.0  0.0 - 0.1 (K/uL)  BASIC METABOLIC PANEL      Component Value Range   Sodium 139  135 - 145 (mEq/L)   Potassium 3.8  3.5 - 5.1 (mEq/L)   Chloride 105  96 - 112 (mEq/L)   CO2 28  19 - 32 (mEq/L)   Glucose, Bld 174 (*) 70 - 99 (mg/dL)   BUN 5 (*) 6 - 23 (mg/dL)   Creatinine, Ser 3.24  0.50 - 1.10 (mg/dL)   Calcium 9.5  8.4 - 40.1 (mg/dL)   GFR calc non Af Amer >90  >90 (mL/min)   GFR calc Af Amer >90  >90 (mL/min)  URINALYSIS, ROUTINE W REFLEX MICROSCOPIC      Component Value Range   Color, Urine YELLOW  YELLOW    APPearance CLEAR  CLEAR    Specific Gravity, Urine 1.015  1.005 - 1.030    pH 7.0  5.0 - 8.0    Glucose, UA >1000 (*) NEGATIVE (mg/dL)   Hgb urine dipstick NEGATIVE  NEGATIVE    Bilirubin Urine NEGATIVE  NEGATIVE    Ketones, ur NEGATIVE  NEGATIVE (mg/dL)   Protein, ur NEGATIVE  NEGATIVE (mg/dL)   Urobilinogen, UA 0.2  0.0 - 1.0 (mg/dL)   Nitrite NEGATIVE  NEGATIVE    Leukocytes, UA NEGATIVE  NEGATIVE   URINE MICROSCOPIC-ADD ON      Component Value Range   Squamous Epithelial / LPF FEW (*) RARE    WBC, UA 3-6  <3 (WBC/hpf)   RBC / HPF 0-2  <3 (RBC/hpf)   Bacteria, UA FEW (*) RARE          MDM      10:56 AM patient is feeling better. Pain has resolved. No focal neuro deficits on exam. She moves bilateral lower extremities without difficulty.  She states she has recently ran out of  her narcotic pain medication.  I will prescribe a short course of her  medication along with a muscle relaxer. No fever, vomiting, abdominal pain or hematuria to suggest kidney stone or pyelonephritis .  Likely muscular.  She agrees to close followup with her primary care physician this week.  Patient / Family / Caregiver understand and agree with initial ED impression and plan with expectations set for ED visit. Pt feels improved after observation and/or treatment in ED. Pt stable in ED with no significant deterioration in condition.     Hanad Leino L. Madailein Londo, [name] 09/08/11 1058

## 2011-09-08 NOTE — ED Notes (Signed)
Pt c/o pain in her right lower back since 2:30 am. Pt states that the pain woke her up. Pt denies urinary symptoms. Pt also c/o pain in her left hip and leg x 1 month.

## 2011-09-08 NOTE — Discharge Instructions (Signed)
Flank Pain Flank pain is pain in your side.  HOME CARE  Home care and treatment will depend on the cause of your pain.   Some medicines can help relieve the pain. Take all medicine as told by your doctor.   Tell your doctor about any changes in your pain.   Follow up with your doctor.  GET HELP RIGHT AWAY IF:   Your pain does not get better with medicine.   The pain gets worse.   You have belly (abdominal) pain.   You are short of breath.   You always feel sick to your stomach (nauseous).   You keep throwing up (vomiting).   You have puffiness (swelling) in your belly.   You pass out (faint).   You have a temperature by mouth above 102 F (38.9 C), not controlled by medicine.  MAKE SURE YOU:   Understand these instructions.   Will watch your condition.   Will get help right away if you are not doing well or get worse.  Document Released: 04/17/2008 Document Revised: 03/21/2011 Document Reviewed: 12/24/2009 [employer] Patient Information 2012 [employer]. 

## 2011-09-09 NOTE — ED Provider Notes (Signed)
Medical screening examination/treatment/procedure(s) were performed by non-physician practitioner and as supervising physician I was immediately available for consultation/collaboration.  Nicoletta Dress. Colon Branch, MD 09/09/11 [phone]

## 2012-01-14 ENCOUNTER — Other Ambulatory Visit (HOSPITAL_COMMUNITY): Payer: Self-pay | Admitting: Family Medicine

## 2012-01-14 ENCOUNTER — Ambulatory Visit (HOSPITAL_COMMUNITY)
Admission: RE | Admit: 2012-01-14 | Discharge: 2012-01-14 | Disposition: A | Discharge status: Home / Self Care | Payer: Medicaid Other | Source: Ambulatory Visit | Attending: Family Medicine | Admitting: Family Medicine

## 2012-01-14 DIAGNOSIS — S59909A Unspecified injury of unspecified elbow, initial encounter: Secondary | ICD-10-CM | POA: Insufficient documentation

## 2012-01-14 DIAGNOSIS — X58XXXA Exposure to other specified factors, initial encounter: Secondary | ICD-10-CM | POA: Insufficient documentation

## 2012-01-14 DIAGNOSIS — M25539 Pain in unspecified wrist: Secondary | ICD-10-CM

## 2012-01-14 DIAGNOSIS — S6990XA Unspecified injury of unspecified wrist, hand and finger(s), initial encounter: Secondary | ICD-10-CM | POA: Insufficient documentation

## 2012-01-30 ENCOUNTER — Telehealth: Payer: Self-pay

## 2012-01-30 NOTE — Telephone Encounter (Signed)
Called pt to triage for colonoscopy. She said she has problems with constipation and some abdominal pain. Ov with Gerrit Halls, NP on 02/12/2012 @ 2:00 PM .

## 2012-02-12 ENCOUNTER — Encounter: Payer: Self-pay | Admitting: Gastroenterology

## 2012-02-12 ENCOUNTER — Other Ambulatory Visit: Payer: Self-pay | Admitting: Gastroenterology

## 2012-02-12 ENCOUNTER — Ambulatory Visit (INDEPENDENT_AMBULATORY_CARE_PROVIDER_SITE_OTHER): Payer: Medicaid Other | Admitting: Gastroenterology

## 2012-02-12 VITALS — BP 135/83 | HR 80 | Temp 97.4°F | Ht 65.5 in | Wt 184.0 lb

## 2012-02-12 DIAGNOSIS — K5900 Constipation, unspecified: Secondary | ICD-10-CM

## 2012-02-12 MED ORDER — LUBIPROSTONE 8 MCG PO CAPS: 8.0000 ug | ORAL_CAPSULE | Freq: Two times a day (BID) | ORAL | Status: AC

## 2012-02-12 MED ORDER — PEG 3350-KCL-NA BICARB-NACL 420 G PO SOLR: ORAL | Status: AC

## 2012-02-12 NOTE — Progress Notes (Signed)
Referring Provider: Colette Ribas, MD Primary Care Physician:  Colette Ribas, MD Primary Gastroenterologist:  Dr. Darrick Penna  Chief Complaint  Patient presents with  . Colonoscopy    HPI:   51 year old female who presents today for visit prior to screening colonoscopy. Notes constant lower abdominal pain for several years. "Griping". Bloating, gassy. Small balls of BM. States "poop smells horrible". Notes worse with types of food she eats. Eating pork skins, cracklins, fried pork chops. BM once per week. MOM as needed. No blood in stool that she has noted. Gynecologist gave [employer] in Jan, helped.   Past Medical History  Diagnosis Date  . Diabetes mellitus   . Depression   . Constipation   . Fibromyalgia   . Anemia     Past Surgical History  Procedure Date  . Knee surgery   . Cystectomy   . Shoulder surgery     right  . Endometrial ablation     Current Outpatient Prescriptions  Medication Sig Dispense Refill  . acetaminophen (TYLENOL ARTHRITIS PAIN) 650 MG CR tablet Take 650 mg by mouth every 8 (eight) hours as needed.      . diazepam (VALIUM) 10 MG tablet Take 10 mg by mouth daily as needed. For sleep or anxiety      . ferrous sulfate (FERROUSUL) 325 (65 FE) MG tablet Take 1 tablet (325 mg total) by mouth 3 (three) times daily with meals.  30 tablet  2  . fexofenadine (ALLEGRA) 180 MG tablet Take 180 mg by mouth daily.      Marland Kitchen FLUoxetine (PROZAC) 20 MG capsule Take 20 mg by mouth daily.      Marland Kitchen gabapentin (NEURONTIN) 300 MG capsule Take 300 mg by mouth 3 (three) times daily.       . hydrochlorothiazide (HYDRODIURIL) 25 MG tablet Take 25 mg by mouth daily.       . insulin lispro protamine-insulin lispro (HUMALOG 50/50) (50-50) 100 UNIT/ML SUSP Inject 30 Units into the skin 3 (three) times daily.      Marland Kitchen losartan (COZAAR) 50 MG tablet Take 50 mg by mouth daily.       . meloxicam (MOBIC) 15 MG tablet Take 15 mg by mouth daily.       . metFORMIN (GLUCOPHAGE) 1000 MG  tablet Take 1,000 mg by mouth 2 (two) times daily with a meal.        . methocarbamol (ROBAXIN) 500 MG tablet Take two tablets po TID prn muscle spasms  42 tablet  0  . oxycodone-acetaminophen (NARVOX) 10-500 MG per tablet Take 1 tablet by mouth every 6 (six) hours as needed. For pain      . pantoprazole (PROTONIX) 20 MG tablet Take 2 tablets (40 mg total) by mouth daily.  10 tablet  1  . potassium chloride SA (K-DUR,KLOR-CON) 20 MEQ tablet Take 1 tablet (20 mEq total) by mouth 2 (two) times daily.  4 tablet  0  . PRESCRIPTION MEDICATION 1 each every 6 (six) months. Injection (unknown name, strength) for back pain at [facility]. Last received September 2012      . traZODone (DESYREL) 50 MG tablet Take 50 mg by mouth at bedtime.        . vitamin B-12 (CYANOCOBALAMIN) 1000 MCG tablet Take 1,000 mcg by mouth daily.      . cyclobenzaprine (FLEXERIL) 10 MG tablet Take 10 mg by mouth daily.        Marland Kitchen lubiprostone (AMITIZA) 8 MCG capsule Take 1 capsule (8 mcg  total) by mouth 2 (two) times daily with a meal.  60 capsule  1    Allergies as of 02/12/2012  . (No Known Allergies)    Family History  Problem Relation Age of Onset  . Hypertension Mother   . Cancer Mother   . Asthma Sister   . Asthma Brother   . Stomach cancer      distant relative on dad's side of family  . Colon cancer Neg Hx     History   Social History  . Marital Status: Divorced    Spouse Name: N/A    Number of Children: N/A  . Years of Education: N/A   Occupational History  . Not on file.   Social History Main Topics  . Smoking status: Former Smoker -- 0.5 packs/day for 15 years    Types: Cigarettes    Quit date: 04/29/1991  . Smokeless tobacco: Never Used  . Alcohol Use: 0.6 oz/week    1 Glasses of wine per week  . Drug Use: Yes    Special: Marijuana, Cocaine     crack/cocaine 20 yrs ago  . Sexually Active: No   Other Topics Concern  . Not on file   Social History Narrative  . No narrative on  file    Review of Systems: Gen: Denies any fever, chills, loss of appetite, fatigue, weight loss. CV: Denies chest pain, heart palpitations, syncope, peripheral edema. Resp: Denies shortness of breath with rest, cough, wheezing GI: Denies dysphagia or odynophagia. Denies hematemesis, fecal incontinence, or jaundice.  GU : Denies urinary burning, urinary frequency, urinary incontinence.  MS: Denies joint pain, muscle weakness, cramps, limited movement Derm: Denies rash, itching, dry skin Psych: Denies depression, anxiety, confusion or memory loss  Heme: Denies bruising, bleeding, and enlarged lymph nodes.  Physical Exam: BP 135/83  Pulse 80  Temp 97.4 F (36.3 C) (Temporal)  Ht 5' 5.5" (1.664 m)  Wt 184 lb (83.462 kg)  BMI 30.15 kg/m2 General:   Alert and oriented. Well-developed, well-nourished, pleasant and cooperative. Head:  Normocephalic and atraumatic. Eyes:  Conjunctiva pink, sclera clear, no icterus.   Conjunctiva pink. Ears:  Normal auditory acuity. Nose:  No deformity, discharge,  or lesions. Mouth:  No deformity or lesions, mucosa pink and moist.  Neck:  Supple, without mass or thyromegaly. Lungs:  Clear to auscultation bilaterally, without wheezing, rales, or rhonchi.  Heart:  S1, S2 present without murmurs noted.  Abdomen:  +BS, soft, non-tender and non-distended. Without mass or HSM. No rebound or guarding.Possible umbilical hernia.  Rectal:  Deferred  Msk:  Symmetrical without gross deformities. Normal posture. Extremities:  Without clubbing or edema. Neurologic:  Alert and  oriented x4;  grossly normal neurologically. Skin:  Intact, warm and dry without significant lesions or rashes Cervical Nodes:  No significant cervical adenopathy. Psych:  Alert and cooperative. Normal mood and affect.

## 2012-02-12 NOTE — Patient Instructions (Addendum)
Review the high fiber diet. Start taking Amitiza 8 mcg twice a day WITH FOOD to avoid nausea. Samples have been provided.   We have set you up for a colonoscopy in the near future with Dr. Darrick Penna.   Diabetes instructions: Take 1/2 dose of insulin evening beforeNo diabetes medications the day of the procedure   High Fiber Diet A high fiber diet changes your normal diet to include more whole grains, legumes, fruits, and vegetables. Changes in the diet involve replacing refined carbohydrates with unrefined foods. The calorie level of the diet is essentially unchanged. The Dietary Reference Intake (recommended amount) for adult males is 38 g per day. For adult females, it is 25 g per day. Pregnant and lactating women should consume 28 g of fiber per day. Fiber is the intact part of a plant that is not broken down during digestion. Functional fiber is fiber that has been isolated from the plant to provide a beneficial effect in the body. PURPOSE  Increase stool bulk.   Ease and regulate bowel movements.   Lower cholesterol.  INDICATIONS THAT YOU NEED MORE FIBER  Constipation and hemorrhoids.   Uncomplicated diverticulosis (intestine condition) and irritable bowel syndrome.   Weight management.   As a protective measure against hardening of the arteries (atherosclerosis), diabetes, and cancer.  NOTE OF CAUTION If you have a digestive or bowel problem, ask your caregiver for advice before adding high fiber foods to your diet. Some of the following medical problems are such that a high fiber diet should not be used without consulting your caregiver:  Acute diverticulitis (intestine infection).   Partial small bowel obstructions.   Complicated diverticular disease involving bleeding, rupture (perforation), or abscess (boil, furuncle).   Presence of autonomic neuropathy (nerve damage) or gastric paresis (stomach cannot empty itself).  GUIDELINES FOR INCREASING FIBER  Start adding fiber  to the diet slowly. A gradual increase of about 5 more grams (2 slices of whole-wheat bread, 2 servings of most fruits or vegetables, or 1 bowl of high fiber cereal) per day is best. Too rapid an increase in fiber may result in constipation, flatulence, and bloating.   Drink enough water and fluids to keep your urine clear or pale yellow. Water, juice, or caffeine-free drinks are recommended. Not drinking enough fluid may cause constipation.   Eat a variety of high fiber foods rather than one type of fiber.   Try to increase your intake of fiber through using high fiber foods rather than fiber pills or supplements that contain small amounts of fiber.   The goal is to change the types of food eaten. Do not supplement your present diet with high fiber foods, but replace foods in your present diet.  INCLUDE A VARIETY OF FIBER SOURCES  Replace refined and processed grains with whole grains, canned fruits with fresh fruits, and incorporate other fiber sources. White rice, white breads, and most bakery goods contain little or no fiber.   Brown whole-grain rice, buckwheat oats, and many fruits and vegetables are all good sources of fiber. These include: broccoli, Brussels sprouts, cabbage, cauliflower, beets, sweet potatoes, white potatoes (skin on), carrots, tomatoes, eggplant, squash, berries, fresh fruits, and dried fruits.   Cereals appear to be the richest source of fiber. Cereal fiber is found in whole grains and bran. Bran is the fiber-rich outer coat of cereal grain, which is largely removed in refining. In whole-grain cereals, the bran remains. In breakfast cereals, the largest amount of fiber is found in those  with "bran" in their names. The fiber content is sometimes indicated on the label.   You may need to include additional fruits and vegetables each day.   In baking, for 1 cup white flour, you may use the following substitutions:   1 cup whole-wheat flour minus 2 tbs.    cup white  flour plus  cup whole-wheat flour.  Document Released: 07/09/2005 Document Revised: 06/28/2011 Document Reviewed: 05/17/2009 [facility] Patient Information 2012 [place], [place].

## 2012-02-16 ENCOUNTER — Encounter: Payer: Self-pay | Admitting: Gastroenterology

## 2012-02-16 DIAGNOSIS — K5900 Constipation, unspecified: Secondary | ICD-10-CM | POA: Insufficient documentation

## 2012-02-16 NOTE — Assessment & Plan Note (Signed)
51 year old female with need for screening colonoscopy. No melena, hematochezia. Notes chronic lower abdominal pain, unrelieved by BM. Described as "griping". Uribel prescribed by gynecologist with some improvement. Notes chronic constipation, needs bowel regimen. Likely secondary to dietary intake and medication.   Proceed with colonoscopy with Dr. Darrick Penna in the near future. The risks, benefits, and alternatives have been discussed in detail with the patient. They state understanding and desire to proceed.  WILL BE DONE WITH PROPOFOL SECONDARY TO POLYPHARMACY Amitiza 8 mc po BID, samples provided HIGH FIBER DIET

## 2012-02-18 NOTE — Progress Notes (Signed)
Faxed to PCP

## 2012-02-19 ENCOUNTER — Encounter (HOSPITAL_COMMUNITY): Payer: Self-pay

## 2012-02-19 ENCOUNTER — Encounter (HOSPITAL_COMMUNITY)
Admission: RE | Admit: 2012-02-19 | Discharge: 2012-02-19 | Disposition: A | Discharge status: Home / Self Care | Payer: Medicaid Other | Source: Ambulatory Visit | Attending: Gastroenterology | Admitting: Gastroenterology

## 2012-02-19 LAB — HEMOGLOBIN AND HEMATOCRIT, BLOOD
HCT: 35.6 % — ABNORMAL LOW (ref 36.0–46.0)
Hemoglobin: 11.7 g/dL — ABNORMAL LOW (ref 12.0–15.0)

## 2012-02-19 LAB — BASIC METABOLIC PANEL WITH GFR
BUN: 17 mg/dL (ref 6–23)
CO2: 29 meq/L (ref 19–32)
Calcium: 9.9 mg/dL (ref 8.4–10.5)
Chloride: 109 meq/L (ref 96–112)
Creatinine, Ser: 0.83 mg/dL (ref 0.50–1.10)
GFR calc Af Amer: >90 mL/min
GFR calc non Af Amer: 80 mL/min — ABNORMAL LOW
Glucose, Bld: 72 mg/dL (ref 70–99)
Potassium: 4.2 meq/L (ref 3.5–5.1)
Sodium: 147 meq/L — ABNORMAL HIGH (ref 135–145)

## 2012-02-19 NOTE — Patient Instructions (Addendum)
Your procedure is scheduled on: 02/26/12  Report to [facility] at 07:00 AM.  Call this number if you have problems the morning of surgery: [phone]   Remember:   May have clear liquids:until Midnight .  Clear liquids include soda, tea, black coffee, apple or grape juice, broth.  Take these medicines the morning of surgery with A SIP OF WATER: Protonix, Flexeril, Allegra, Prozac, Neurontin, HCTZ, Losartan and Valium if needed.   Do not wear jewelry, make-up or nail polish.  Do not wear lotions, powders, or perfumes. You may wear deodorant.  Do not bring valuables to the hospital.  Contacts, dentures or bridgework may not be worn into surgery.  Leave suitcase in the car. After surgery it may be brought to your room.  For patients admitted to the hospital, checkout time is 11:00 AM the day of discharge.   Patients discharged the day of surgery will not be allowed to drive home.  Special Instructions: Use your bowel prep as directed by your doctor.   Please read over the following fact sheets that you were given: Pain Booklet, Anesthesia Post-op Instructions and Care and Recovery After Surgery    Colonoscopy A colonoscopy is an exam to evaluate your entire colon. In this exam, your colon is cleansed. A long fiberoptic tube is inserted through your rectum and into your colon. The fiberoptic scope (endoscope) is a long bundle of enclosed and very flexible fibers. These fibers transmit light to the area examined and send images from that area to your caregiver. Discomfort is usually minimal. You may be given a drug to help you sleep (sedative) during or prior to the procedure. This exam helps to detect lumps (tumors), polyps, inflammation, and areas of bleeding. Your caregiver may also take a small piece of tissue (biopsy) that will be examined under a microscope. LET YOUR CAREGIVER KNOW ABOUT:   Allergies to food or medicine.   Medicines taken, including vitamins, herbs, eyedrops,  over-the-counter medicines, and creams.   Use of steroids (by mouth or creams).   Previous problems with anesthetics or numbing medicines.   History of bleeding problems or blood clots.   Previous surgery.   Other health problems, including diabetes and kidney problems.   Possibility of pregnancy, if this applies.  BEFORE THE PROCEDURE   A clear liquid diet may be required for 2 days before the exam.   Ask your caregiver about changing or stopping your regular medications.   Liquid injections (enemas) or laxatives may be required.   A large amount of electrolyte solution may be given to you to drink over a short period of time. This solution is used to clean out your colon.   You should be present 60 minutes prior to your procedure or as directed by your caregiver.  AFTER THE PROCEDURE   If you received a sedative or pain relieving medication, you will need to arrange for someone to drive you home.   Occasionally, there is a little blood passed with the first bowel movement. Do not be concerned.  FINDING OUT THE RESULTS OF YOUR TEST Not all test results are available during your visit. If your test results are not back during the visit, make an appointment with your caregiver to find out the results. Do not assume everything is normal if you have not heard from your caregiver or the medical facility. It is important for you to follow up on all of your test results. HOME CARE INSTRUCTIONS   It is  not unusual to pass moderate amounts of gas and experience mild abdominal cramping following the procedure. This is due to air being used to inflate your colon during the exam. Walking or a warm pack on your belly (abdomen) may help.   You may resume all normal meals and activities after sedatives and medicines have worn off.   Only take over-the-counter or prescription medicines for pain, discomfort, or fever as directed by your caregiver. Do not use aspirin or blood thinners if a biopsy  was taken. Consult your caregiver for medicine usage if biopsies were taken.  SEEK IMMEDIATE MEDICAL CARE IF:   You have a fever.   You pass large blood clots or fill a toilet with blood following the procedure. This may also occur 10 to 14 days following the procedure. This is more likely if a biopsy was taken.   You develop abdominal pain that keeps getting worse and cannot be relieved with medicine.  Document Released: 07/06/2000 Document Revised: 06/28/2011 Document Reviewed: 02/19/2008 [facility] Patient Information 2012 [place], [place].   PATIENT INSTRUCTIONS POST-ANESTHESIA  IMMEDIATELY FOLLOWING SURGERY:  Do not drive or operate machinery for the first twenty four hours after surgery.  Do not make any important decisions for twenty four hours after surgery or while taking narcotic pain medications or sedatives.  If you develop intractable nausea and vomiting or a severe headache please notify your doctor immediately.  FOLLOW-UP:  Please make an appointment with your surgeon as instructed. You do not need to follow up with anesthesia unless specifically instructed to do so.  WOUND CARE INSTRUCTIONS (if applicable):  Keep a dry clean dressing on the anesthesia/puncture wound site if there is drainage.  Once the wound has quit draining you may leave it open to air.  Generally you should leave the bandage intact for twenty four hours unless there is drainage.  If the epidural site drains for more than 36-48 hours please call the anesthesia department.  QUESTIONS?:  Please feel free to call your physician or the hospital operator if you have any questions, and they will be happy to assist you.

## 2012-02-21 ENCOUNTER — Encounter (HOSPITAL_COMMUNITY): Payer: Self-pay

## 2012-02-26 ENCOUNTER — Encounter (HOSPITAL_COMMUNITY)
Admission: RE | Disposition: A | Discharge status: Home / Self Care | Payer: Self-pay | Source: Ambulatory Visit | Attending: Gastroenterology

## 2012-02-26 ENCOUNTER — Ambulatory Visit (HOSPITAL_COMMUNITY): Payer: Medicaid Other | Admitting: Anesthesiology

## 2012-02-26 ENCOUNTER — Encounter (HOSPITAL_COMMUNITY): Payer: Self-pay | Admitting: Anesthesiology

## 2012-02-26 ENCOUNTER — Ambulatory Visit (HOSPITAL_COMMUNITY)
Admission: RE | Admit: 2012-02-26 | Discharge: 2012-02-26 | Disposition: A | Discharge status: Home / Self Care | Payer: Medicaid Other | Source: Ambulatory Visit | Attending: Gastroenterology | Admitting: Gastroenterology

## 2012-02-26 ENCOUNTER — Encounter (HOSPITAL_COMMUNITY): Payer: Self-pay | Admitting: *Deleted

## 2012-02-26 DIAGNOSIS — Z1211 Encounter for screening for malignant neoplasm of colon: Secondary | ICD-10-CM

## 2012-02-26 DIAGNOSIS — K648 Other hemorrhoids: Secondary | ICD-10-CM | POA: Insufficient documentation

## 2012-02-26 DIAGNOSIS — Z794 Long term (current) use of insulin: Secondary | ICD-10-CM | POA: Insufficient documentation

## 2012-02-26 DIAGNOSIS — Z79899 Other long term (current) drug therapy: Secondary | ICD-10-CM | POA: Insufficient documentation

## 2012-02-26 DIAGNOSIS — E119 Type 2 diabetes mellitus without complications: Secondary | ICD-10-CM | POA: Insufficient documentation

## 2012-02-26 DIAGNOSIS — Z01812 Encounter for preprocedural laboratory examination: Secondary | ICD-10-CM | POA: Insufficient documentation

## 2012-02-26 DIAGNOSIS — Z01810 Encounter for preprocedural cardiovascular examination: Secondary | ICD-10-CM | POA: Insufficient documentation

## 2012-02-26 HISTORY — PX: COLONOSCOPY: SHX174

## 2012-02-26 LAB — GLUCOSE, CAPILLARY
Glucose-Capillary: 153 mg/dL — ABNORMAL HIGH (ref 70–99)
Glucose-Capillary: 164 mg/dL — ABNORMAL HIGH (ref 70–99)

## 2012-02-26 SURGERY — COLONOSCOPY WITH PROPOFOL
Anesthesia: Monitor Anesthesia Care | Site: Rectum

## 2012-02-26 MED ORDER — FENTANYL CITRATE 0.05 MG/ML IJ SOLN
INTRAMUSCULAR | Status: AC
  Filled 2012-02-26: qty 2

## 2012-02-26 MED ORDER — LIDOCAINE HCL (PF) 1 % IJ SOLN
INTRAMUSCULAR | Status: AC
  Filled 2012-02-26: qty 5

## 2012-02-26 MED ORDER — ONDANSETRON HCL 4 MG/2ML IJ SOLN: 4.0000 mg | Freq: Once | INTRAMUSCULAR | Status: DC | PRN

## 2012-02-26 MED ORDER — FENTANYL CITRATE 0.05 MG/ML IJ SOLN
INTRAMUSCULAR | Status: DC | PRN
  Administered 2012-02-26 (×4): 25 ug via INTRAVENOUS

## 2012-02-26 MED ORDER — ONDANSETRON HCL 4 MG/2ML IJ SOLN
4.0000 mg | Freq: Once | INTRAMUSCULAR | Status: AC
  Administered 2012-02-26: 4 mg via INTRAVENOUS

## 2012-02-26 MED ORDER — MIDAZOLAM HCL 2 MG/2ML IJ SOLN
INTRAMUSCULAR | Status: AC
  Filled 2012-02-26: qty 2

## 2012-02-26 MED ORDER — FENTANYL CITRATE 0.05 MG/ML IJ SOLN: 25.0000 ug | INTRAMUSCULAR | Status: DC | PRN

## 2012-02-26 MED ORDER — MIDAZOLAM HCL 2 MG/2ML IJ SOLN
1.0000 mg | INTRAMUSCULAR | Status: DC | PRN
  Administered 2012-02-26 (×2): 2 mg via INTRAVENOUS

## 2012-02-26 MED ORDER — GLYCOPYRROLATE 0.2 MG/ML IJ SOLN
0.2000 mg | Freq: Once | INTRAMUSCULAR | Status: AC
  Administered 2012-02-26: 0.2 mg via INTRAVENOUS

## 2012-02-26 MED ORDER — LACTATED RINGERS IV SOLN: INTRAVENOUS | Status: DC

## 2012-02-26 MED ORDER — PROPOFOL 10 MG/ML IV EMUL
INTRAVENOUS | Status: AC
  Filled 2012-02-26: qty 20

## 2012-02-26 MED ORDER — ONDANSETRON HCL 4 MG/2ML IJ SOLN
INTRAMUSCULAR | Status: AC
  Filled 2012-02-26: qty 2

## 2012-02-26 MED ORDER — PROPOFOL 10 MG/ML IV EMUL
INTRAVENOUS | Status: DC | PRN
  Administered 2012-02-26: 150 ug/kg/min via INTRAVENOUS

## 2012-02-26 MED ORDER — STERILE WATER FOR IRRIGATION IR SOLN
Status: DC | PRN
  Administered 2012-02-26: 08:00:00

## 2012-02-26 MED ORDER — GLYCOPYRROLATE 0.2 MG/ML IJ SOLN
INTRAMUSCULAR | Status: AC
  Filled 2012-02-26: qty 1

## 2012-02-26 MED ORDER — LACTATED RINGERS IV SOLN
INTRAVENOUS | Status: DC
  Administered 2012-02-26: 1000 mL via INTRAVENOUS

## 2012-02-26 SURGICAL SUPPLY — 21 items
ELECT REM PT RETURN 9FT ADLT (ELECTROSURGICAL)
ELECTRODE REM PT RTRN 9FT ADLT (ELECTROSURGICAL) IMPLANT
FCP BXJMBJMB 240X2.8X (CUTTING FORCEPS)
FLOOR PAD 36X40 (MISCELLANEOUS) ×2
FORCEPS BIOP RAD 4 LRG CAP 4 (CUTTING FORCEPS) IMPLANT
FORCEPS BIOP RJ4 240 W/NDL (CUTTING FORCEPS)
FORCEPS BXJMBJMB 240X2.8X (CUTTING FORCEPS) IMPLANT
INJECTOR/SNARE I SNARE (MISCELLANEOUS) IMPLANT
LUBRICANT JELLY 4.5OZ STERILE (MISCELLANEOUS) ×2 IMPLANT
MANIFOLD NEPTUNE II (INSTRUMENTS) ×2 IMPLANT
NEEDLE SCLEROTHERAPY 25GX240 (NEEDLE) IMPLANT
PAD FLOOR 36X40 (MISCELLANEOUS) ×1 IMPLANT
PROBE APC STR FIRE (PROBE) IMPLANT
PROBE INJECTION GOLD (MISCELLANEOUS)
PROBE INJECTION GOLD 7FR (MISCELLANEOUS) IMPLANT
SNARE SHORT THROW 13M SML OVAL (MISCELLANEOUS) IMPLANT
SYR 50ML LL SCALE MARK (SYRINGE) ×2 IMPLANT
TRAP SPECIMEN MUCOUS 40CC (MISCELLANEOUS) IMPLANT
TUBING ENDO SMARTCAP PENTAX (MISCELLANEOUS) ×2 IMPLANT
TUBING IRRIGATION ENDOGATOR (MISCELLANEOUS) ×2 IMPLANT
WATER STERILE IRR 1000ML POUR (IV SOLUTION) ×2 IMPLANT

## 2012-02-26 NOTE — Op Note (Signed)
[facility] [address] [place], [place]  [address]  COLONOSCOPY PROCEDURE REPORT  PATIENT:  Judy, Mcdowell  MR#:  [id number] BIRTHDATE:  22-Jul-1961, 50 yrs. old  GENDER:  female  ENDOSCOPIST:  Jonette Eva, MD REF. BY:  Assunta Found, M.D. ASSISTANT:  PROCEDURE DATE:  02/26/2012 PROCEDURE:  Colonoscopy 95621  INDICATIONS:  Screening-NOFIRST DEGRE RELATIVES WIH COLON CA  MEDICATIONS:   MAC sedation, administered by CRNA  DESCRIPTION OF PROCEDURE:    Physical exam was performed. Informed consent was obtained from the patient after explaining the benefits, risks, and alternatives to procedure.  The patient was connected to monitor and placed in left lateral position. Continuous oxygen was provided by nasal cannula and IV medicine administered through an indwelling cannula.  After administration of sedation and rectal exam, the patient's rectum was intubated and the  colonoscope was advanced under direct visualization to the cecum.  The scope was removed slowly by carefully examining the color, texture, anatomy, and integrity mucosa on the way out. The patient was recovered in endoscopy and discharged home in satisfactory condition. <<PROCEDUREIMAGES>>  FINDINGS:  Normal colonoscopy without polyps, masses, vascular ectasias, or inflammatory changes.  SMALL Internal Hemorrhoids were found.  PREP QUALITY:    GOOD CECAL W/D TIME:      18 minutes  COMPLICATIONS:    None  ENDOSCOPIC IMPRESSION: 1) Internal hemorrhoids  RECOMMENDATIONS: HIGH FIBER DIET TCS IN 10 YEARS WITH PROPOFOL  REPEAT EXAM:  No  ______________________________ Jonette Eva, MD  CC:  Assunta Found, M.D.  n. eSIGNED:   Monasia Lair at 02/26/2012 09:44 AM  [place], [place], [phone]

## 2012-02-26 NOTE — Anesthesia Preprocedure Evaluation (Signed)
Anesthesia Evaluation  Patient identified by MRN, date of birth, ID band Patient awake    Reviewed: Allergy & Precautions, H&P , NPO status , Patient's Chart, lab work & pertinent test results  History of Anesthesia Complications Negative for: history of anesthetic complications  Airway Mallampati: I      Dental  (+) Teeth Intact   Pulmonary neg pulmonary ROS,  breath sounds clear to auscultation        Cardiovascular negative cardio ROS  Rhythm:Regular     Neuro/Psych PSYCHIATRIC DISORDERS Anxiety Depression    GI/Hepatic   Endo/Other  Well Controlled, Type 2, Insulin Dependent  Renal/GU      Musculoskeletal  (+) Fibromyalgia -  Abdominal   Peds  Hematology   Anesthesia Other Findings   Reproductive/Obstetrics                           Anesthesia Physical Anesthesia Plan  ASA: III  Anesthesia Plan: MAC   Post-op Pain Management:    Induction: Intravenous  Airway Management Planned: Simple Face Mask  Additional Equipment:   Intra-op Plan:   Post-operative Plan:   Informed Consent: I have reviewed the patients History and Physical, chart, labs and discussed the procedure including the risks, benefits and alternatives for the proposed anesthesia with the patient or authorized representative who has indicated his/her understanding and acceptance.     Plan Discussed with:   Anesthesia Plan Comments:         Anesthesia Quick Evaluation

## 2012-02-26 NOTE — H&P (Signed)
Primary Care Physician:  Colette Ribas, MD Primary Gastroenterologist:  Dr. Darrick Penna  Pre-Procedure History & Physical: HPI:  [name] is a 51 y.o. female here for COLON CANCER SCREENING.   Past Medical History  Diagnosis Date  . Diabetes mellitus   . Depression   . Constipation   . Fibromyalgia   . Anemia     Past Surgical History  Procedure Date  . Knee surgery   . Cystectomy   . Shoulder surgery     right  . Endometrial ablation     Prior to Admission medications   Medication Sig Start Date End Date Taking? Authorizing Provider  acetaminophen (TYLENOL ARTHRITIS PAIN) 650 MG CR tablet Take 650 mg by mouth every 8 (eight) hours as needed. For pain   Yes Historical Provider, MD  aspirin EC 81 MG tablet Take 81 mg by mouth daily.   Yes Historical Provider, MD  diazepam (VALIUM) 10 MG tablet Take 10 mg by mouth daily as needed. For sleep or anxiety   Yes Historical Provider, MD  ferrous sulfate 325 (65 FE) MG tablet Take 325 mg by mouth every other day.    Yes Historical Provider, MD  fexofenadine (ALLEGRA) 180 MG tablet Take 180 mg by mouth daily.   Yes Historical Provider, MD  gabapentin (NEURONTIN) 300 MG capsule Take 300 mg by mouth 3 (three) times daily.  12/18/11  Yes Historical Provider, MD  hydrochlorothiazide (HYDRODIURIL) 25 MG tablet Take 25 mg by mouth daily.  12/13/11  Yes Historical Provider, MD  insulin lispro protamine-insulin lispro (HUMALOG 50/50) (50-50) 100 UNIT/ML SUSP Inject 30 Units into the skin 3 (three) times daily.   Yes Historical Provider, MD  losartan (COZAAR) 50 MG tablet Take 50 mg by mouth daily.  12/13/11  Yes Historical Provider, MD  lubiprostone (AMITIZA) 8 MCG capsule Take 1 capsule (8 mcg total) by mouth 2 (two) times daily with a meal. 02/12/12 03/13/12 Yes Nira Retort, NP  meloxicam (MOBIC) 15 MG tablet Take 15 mg by mouth daily.  12/18/11  Yes Historical Provider, MD  metFORMIN (GLUCOPHAGE) 1000 MG tablet Take 1,000 mg by mouth 2  (two) times daily with a meal.    Yes Historical Provider, MD  methocarbamol (ROBAXIN) 500 MG tablet Take two tablets po TID prn muscle spasms 09/08/11  Yes Tammy L. Triplett, PA  oxycodone-acetaminophen (NARVOX) 10-500 MG per tablet Take 1 tablet by mouth every 6 (six) hours as needed. For pain   Yes Historical Provider, MD  phentermine (ADIPEX-P) 37.5 MG tablet Take 37.5 mg by mouth daily before breakfast.   Yes Historical Provider, MD  potassium chloride SA (K-DUR,KLOR-CON) 20 MEQ tablet Take 1 tablet (20 mEq total) by mouth 2 (two) times daily. 05/03/11 05/02/12 Yes Jonny Ruiz, MD  traZODone (DESYREL) 100 MG tablet Take 100 mg by mouth at bedtime.   Yes Historical Provider, MD  vitamin B-12 (CYANOCOBALAMIN) 1000 MCG tablet Take 1,000 mcg by mouth daily.   Yes Historical Provider, MD    Allergies as of 02/12/2012  . (No Known Allergies)    Family History  Problem Relation Age of Onset  . Hypertension Mother   . Cancer Mother   . Asthma Sister   . Asthma Brother   . Stomach cancer      distant relative on dad's side of family  . Colon cancer Neg Hx     History   Social History  . Marital Status: Divorced    Spouse Name: N/A  Number of Children: N/A  . Years of Education: N/A   Occupational History  . Not on file.   Social History Main Topics  . Smoking status: Former Smoker -- 0.5 packs/day for 15 years    Types: Cigarettes    Quit date: 04/29/1991  . Smokeless tobacco: Never Used  . Alcohol Use: 0.6 oz/week    1 Glasses of wine per week  . Drug Use: Yes    Special: Marijuana, Cocaine     crack/cocaine 20 yrs ago  . Sexually Active: No   Other Topics Concern  . Not on file   Social History Narrative  . No narrative on file    Review of Systems: See HPI, otherwise negative ROS   Physical Exam: BP 129/85  Temp 97.9 F (36.6 C)  Resp 45  SpO2 99% General:   Alert,  pleasant and cooperative in NAD Head:  Normocephalic and atraumatic. Neck:   Supple;  Lungs:  Clear throughout to auscultation.    Heart:  Regular rate and rhythm. Abdomen:  Soft, nontender and nondistended. Normal bowel sounds, without guarding, and without rebound.   Neurologic:  Alert and  oriented x4;  grossly normal neurologically.  Impression/Plan:     SCREENING  Plan:  1. TCS TODAY

## 2012-02-26 NOTE — Anesthesia Postprocedure Evaluation (Signed)
Anesthesia Post Note  Patient: [name]  Procedure(s) Performed: Procedure(s) (LRB): COLONOSCOPY WITH PROPOFOL (N/A)  Anesthesia type: MAC  Patient location: PACU  Post pain: Pain level controlled  Post assessment: Post-op Vital signs reviewed, Patient's Cardiovascular Status Stable, Respiratory Function Stable, Patent Airway, No signs of Nausea or vomiting and Pain level controlled  Last Vitals:  Filed Vitals:   02/26/12 0839  BP: 105/64  Pulse: 68  Temp: 36.7 C  Resp: 12    Post vital signs: Reviewed and stable  Level of consciousness: awake and alert   Complications: No apparent anesthesia complications

## 2012-02-26 NOTE — Transfer of Care (Signed)
Immediate Anesthesia Transfer of Care Note  Patient: [name]  Procedure(s) Performed: Procedure(s) (LRB): COLONOSCOPY WITH PROPOFOL (N/A)  Patient Location: PACU  Anesthesia Type: MAC  Level of Consciousness: awake  Airway & Oxygen Therapy: Patient Spontanous Breathing.   Post-op Assessment: Report given to PACU RN, Post -op Vital signs reviewed and stable and Patient moving all extremities  Post vital signs: Reviewed and stable  Complications: No apparent anesthesia complications

## 2012-02-26 NOTE — Anesthesia Procedure Notes (Signed)
Procedure Name: MAC Date/Time: 02/26/2012 7:55 AM Performed by: Franco Nones Pre-anesthesia Checklist: Patient identified, Emergency Drugs available, Suction available, Timeout performed and Patient being monitored Patient Re-evaluated:Patient Re-evaluated prior to inductionOxygen Delivery Method: Non-rebreather mask

## 2012-03-12 NOTE — Progress Notes (Signed)
AUG 2013 TCS I[facility]  REVIEWED.

## 2012-04-13 ENCOUNTER — Encounter (HOSPITAL_COMMUNITY): Payer: Self-pay

## 2012-04-13 ENCOUNTER — Inpatient Hospital Stay (HOSPITAL_COMMUNITY)
Admission: EM | Admit: 2012-04-13 | Discharge: 2012-04-14 | DRG: 639 | Disposition: A | Discharge status: Home / Self Care | Payer: Medicaid Other | Attending: Internal Medicine | Admitting: Internal Medicine

## 2012-04-13 DIAGNOSIS — F329 Major depressive disorder, single episode, unspecified: Secondary | ICD-10-CM | POA: Diagnosis present

## 2012-04-13 DIAGNOSIS — E1142 Type 2 diabetes mellitus with diabetic polyneuropathy: Secondary | ICD-10-CM | POA: Diagnosis present

## 2012-04-13 DIAGNOSIS — J029 Acute pharyngitis, unspecified: Secondary | ICD-10-CM

## 2012-04-13 DIAGNOSIS — Z6828 Body mass index (BMI) 28.0-28.9, adult: Secondary | ICD-10-CM

## 2012-04-13 DIAGNOSIS — K5900 Constipation, unspecified: Secondary | ICD-10-CM

## 2012-04-13 DIAGNOSIS — E1149 Type 2 diabetes mellitus with other diabetic neurological complication: Secondary | ICD-10-CM | POA: Diagnosis present

## 2012-04-13 DIAGNOSIS — Z87891 Personal history of nicotine dependence: Secondary | ICD-10-CM

## 2012-04-13 DIAGNOSIS — E1310 Other specified diabetes mellitus with ketoacidosis without coma: Principal | ICD-10-CM | POA: Diagnosis present

## 2012-04-13 DIAGNOSIS — E1110 Type 2 diabetes mellitus with ketoacidosis without coma: Secondary | ICD-10-CM | POA: Diagnosis present

## 2012-04-13 DIAGNOSIS — N939 Abnormal uterine and vaginal bleeding, unspecified: Secondary | ICD-10-CM

## 2012-04-13 DIAGNOSIS — E1140 Type 2 diabetes mellitus with diabetic neuropathy, unspecified: Secondary | ICD-10-CM | POA: Diagnosis present

## 2012-04-13 DIAGNOSIS — Z23 Encounter for immunization: Secondary | ICD-10-CM

## 2012-04-13 DIAGNOSIS — Z9189 Other specified personal risk factors, not elsewhere classified: Secondary | ICD-10-CM

## 2012-04-13 DIAGNOSIS — E876 Hypokalemia: Secondary | ICD-10-CM

## 2012-04-13 DIAGNOSIS — Z79899 Other long term (current) drug therapy: Secondary | ICD-10-CM

## 2012-04-13 DIAGNOSIS — Z794 Long term (current) use of insulin: Secondary | ICD-10-CM

## 2012-04-13 DIAGNOSIS — IMO0001 Reserved for inherently not codable concepts without codable children: Secondary | ICD-10-CM | POA: Diagnosis present

## 2012-04-13 DIAGNOSIS — E875 Hyperkalemia: Secondary | ICD-10-CM | POA: Diagnosis present

## 2012-04-13 DIAGNOSIS — D649 Anemia, unspecified: Secondary | ICD-10-CM | POA: Diagnosis present

## 2012-04-13 DIAGNOSIS — E669 Obesity, unspecified: Secondary | ICD-10-CM | POA: Diagnosis present

## 2012-04-13 DIAGNOSIS — F3289 Other specified depressive episodes: Secondary | ICD-10-CM | POA: Diagnosis present

## 2012-04-13 DIAGNOSIS — Z7982 Long term (current) use of aspirin: Secondary | ICD-10-CM

## 2012-04-13 LAB — URINE MICROSCOPIC-ADD ON

## 2012-04-13 LAB — BASIC METABOLIC PANEL
Chloride: 83 mEq/L — ABNORMAL LOW (ref 96–112)
Chloride: 98 mEq/L (ref 96–112)
GFR calc Af Amer: >90 mL/min (ref >90)
GFR calc Af Amer: >90 mL/min (ref >90)
GFR calc non Af Amer: >90 mL/min (ref >90)
GFR calc non Af Amer: >90 mL/min (ref >90)
Potassium: 5.3 mEq/L — ABNORMAL HIGH (ref 3.5–5.1)
Potassium: 3.9 mEq/L (ref 3.5–5.1)
Sodium: 124 mEq/L — ABNORMAL LOW (ref 135–145)
Sodium: 134 mEq/L — ABNORMAL LOW (ref 135–145)

## 2012-04-13 LAB — URINALYSIS, ROUTINE W REFLEX MICROSCOPIC
Leukocytes, UA: NEGATIVE
Nitrite: NEGATIVE
Specific Gravity, Urine: 1.02 (ref 1.005–1.030)
Urobilinogen, UA: 0.2 mg/dL (ref 0.0–1.0)
pH: 5.5 (ref 5.0–8.0)

## 2012-04-13 LAB — BLOOD GAS, ARTERIAL
Acid-base deficit: 15.2 mmol/L — ABNORMAL HIGH (ref 0.0–2.0)
TCO2: 10.3 mmol/L (ref 0–100)
pCO2 arterial: 27.2 mmHg — ABNORMAL LOW (ref 35.0–45.0)
pH, Arterial: 7.227 — ABNORMAL LOW (ref 7.350–7.450)
pO2, Arterial: 102 mmHg — ABNORMAL HIGH (ref 80.0–100.0)

## 2012-04-13 LAB — CBC WITH DIFFERENTIAL/PLATELET
Basophils Absolute: 0 10*3/uL (ref 0.0–0.1)
Basophils Relative: 0 % (ref 0–1)
Eosinophils Absolute: 0.1 10*3/uL (ref 0.0–0.7)
Hemoglobin: 11.6 g/dL — ABNORMAL LOW (ref 12.0–15.0)
MCHC: 32.3 g/dL (ref 30.0–36.0)
Monocytes Relative: 6 % (ref 3–12)
Neutro Abs: 4.5 10*3/uL (ref 1.7–7.7)
Neutrophils Relative %: 71 % (ref 43–77)
Platelets: 396 10*3/uL (ref 150–400)
RDW: 12.8 % (ref 11.5–15.5)

## 2012-04-13 LAB — GLUCOSE, CAPILLARY
Glucose-Capillary: 181 mg/dL — ABNORMAL HIGH (ref 70–99)
Glucose-Capillary: 237 mg/dL — ABNORMAL HIGH (ref 70–99)
Glucose-Capillary: 318 mg/dL — ABNORMAL HIGH (ref 70–99)

## 2012-04-13 MED ORDER — INSULIN ASPART 100 UNIT/ML ~~LOC~~ SOLN
5.0000 [IU] | Freq: Once | SUBCUTANEOUS | Status: AC
  Administered 2012-04-13: 20:00:00 via INTRAVENOUS
  Filled 2012-04-13 (×2): qty 1

## 2012-04-13 MED ORDER — LOSARTAN POTASSIUM 50 MG PO TABS
50.0000 mg | ORAL_TABLET | Freq: Every day | ORAL | Status: DC
  Administered 2012-04-14: 50 mg via ORAL
  Filled 2012-04-13: qty 1

## 2012-04-13 MED ORDER — ONDANSETRON HCL 4 MG/2ML IJ SOLN
4.0000 mg | Freq: Once | INTRAMUSCULAR | Status: AC
  Administered 2012-04-13: 4 mg via INTRAVENOUS
  Filled 2012-04-13: qty 2

## 2012-04-13 MED ORDER — FLEET ENEMA 7-19 GM/118ML RE ENEM: 1.0000 | ENEMA | Freq: Once | RECTAL | Status: AC | PRN

## 2012-04-13 MED ORDER — SODIUM CHLORIDE 0.9 % IV BOLUS (SEPSIS)
1000.0000 mL | Freq: Once | INTRAVENOUS | Status: AC
  Administered 2012-04-13: 1000 mL via INTRAVENOUS

## 2012-04-13 MED ORDER — TRAZODONE HCL 50 MG PO TABS: 100.0000 mg | ORAL_TABLET | Freq: Every evening | ORAL | Status: DC | PRN

## 2012-04-13 MED ORDER — ACETAMINOPHEN 650 MG RE SUPP: 650.0000 mg | Freq: Four times a day (QID) | RECTAL | Status: DC | PRN

## 2012-04-13 MED ORDER — DEXTROSE 50 % IV SOLN: 25.0000 mL | INTRAVENOUS | Status: DC | PRN

## 2012-04-13 MED ORDER — HYDROMORPHONE HCL PF 1 MG/ML IJ SOLN
0.5000 mg | INTRAMUSCULAR | Status: DC | PRN
  Administered 2012-04-14 (×2): 0.5 mg via INTRAVENOUS
  Filled 2012-04-13 (×2): qty 1

## 2012-04-13 MED ORDER — ACETAMINOPHEN 325 MG PO TABS: 650.0000 mg | ORAL_TABLET | ORAL | Status: DC | PRN

## 2012-04-13 MED ORDER — GABAPENTIN 300 MG PO CAPS
300.0000 mg | ORAL_CAPSULE | Freq: Three times a day (TID) | ORAL | Status: DC
  Administered 2012-04-13 – 2012-04-14 (×2): 300 mg via ORAL
  Filled 2012-04-13 (×2): qty 1

## 2012-04-13 MED ORDER — SODIUM CHLORIDE 0.9 % IV SOLN
INTRAVENOUS | Status: DC
  Administered 2012-04-13: 1.8 [IU]/h via INTRAVENOUS
  Filled 2012-04-13: qty 1

## 2012-04-13 MED ORDER — INSULIN REGULAR HUMAN 100 UNIT/ML IJ SOLN
INTRAMUSCULAR | Status: AC
  Filled 2012-04-13: qty 3

## 2012-04-13 MED ORDER — INFLUENZA VIRUS VACC SPLIT PF IM SUSP
0.5000 mL | INTRAMUSCULAR | Status: AC
  Administered 2012-04-14: 0.5 mL via INTRAMUSCULAR
  Filled 2012-04-13: qty 0.5

## 2012-04-13 MED ORDER — ONDANSETRON HCL 4 MG/2ML IJ SOLN: 4.0000 mg | INTRAMUSCULAR | Status: DC | PRN

## 2012-04-13 MED ORDER — DEXTROSE-NACL 5-0.45 % IV SOLN
INTRAVENOUS | Status: DC
  Administered 2012-04-13 – 2012-04-14 (×2): via INTRAVENOUS

## 2012-04-13 MED ORDER — INSULIN ASPART 100 UNIT/ML ~~LOC~~ SOLN
5.0000 [IU] | Freq: Once | SUBCUTANEOUS | Status: AC
  Administered 2012-04-13: 5 [IU] via INTRAVENOUS
  Filled 2012-04-13: qty 1

## 2012-04-13 MED ORDER — ENOXAPARIN SODIUM 40 MG/0.4ML ~~LOC~~ SOLN
40.0000 mg | SUBCUTANEOUS | Status: DC
  Administered 2012-04-13: 40 mg via SUBCUTANEOUS
  Filled 2012-04-13: qty 0.4

## 2012-04-13 MED ORDER — PANTOPRAZOLE SODIUM 40 MG IV SOLR
40.0000 mg | Freq: Once | INTRAVENOUS | Status: AC
  Administered 2012-04-13: 40 mg via INTRAVENOUS
  Filled 2012-04-13: qty 40

## 2012-04-13 MED ORDER — INSULIN REGULAR HUMAN 100 UNIT/ML IJ SOLN: 5.0000 [IU] | Freq: Once | INTRAMUSCULAR | Status: DC

## 2012-04-13 MED ORDER — ASPIRIN EC 81 MG PO TBEC
81.0000 mg | DELAYED_RELEASE_TABLET | Freq: Every day | ORAL | Status: DC
  Administered 2012-04-14: 81 mg via ORAL
  Filled 2012-04-13 (×2): qty 1

## 2012-04-13 MED ORDER — HYDROMORPHONE HCL PF 1 MG/ML IJ SOLN
1.0000 mg | Freq: Once | INTRAMUSCULAR | Status: AC
  Administered 2012-04-13: 1 mg via INTRAVENOUS
  Filled 2012-04-13: qty 1

## 2012-04-13 MED ORDER — SODIUM CHLORIDE 0.9 % IV SOLN
INTRAVENOUS | Status: DC
  Administered 2012-04-13: 21:00:00 via INTRAVENOUS

## 2012-04-13 MED ORDER — ENOXAPARIN SODIUM 40 MG/0.4ML ~~LOC~~ SOLN: 40.0000 mg | SUBCUTANEOUS | Status: DC

## 2012-04-13 MED ORDER — METHOCARBAMOL 500 MG PO TABS
1000.0000 mg | ORAL_TABLET | Freq: Three times a day (TID) | ORAL | Status: DC
  Administered 2012-04-13 – 2012-04-14 (×2): 1000 mg via ORAL
  Filled 2012-04-13 (×2): qty 2

## 2012-04-13 MED ORDER — SODIUM CHLORIDE 0.9 % IV SOLN: INTRAVENOUS | Status: DC

## 2012-04-13 MED ORDER — POTASSIUM CHLORIDE 10 MEQ/100ML IV SOLN: 10.0000 meq | INTRAVENOUS | Status: AC

## 2012-04-13 MED ORDER — BISACODYL 10 MG RE SUPP: 10.0000 mg | Freq: Every day | RECTAL | Status: DC | PRN

## 2012-04-13 NOTE — ED Provider Notes (Signed)
History  This chart was scribed for Judy Hutching, MD by Erskine Emery. This patient was seen in room APA05/APA05 and the patient's care was started at 16:56.   CSN: [id number]  Arrival date & time 04/13/12  1650   First MD Initiated Contact with Patient 04/13/12 1656      Chief Complaint  Patient presents with  . Hyperglycemia    (Consider location/radiation/quality/duration/timing/severity/associated sxs/prior treatment) The history is provided by the patient. No language interpreter was used.  Judy ODOM is a 51 y.o. female who presents to the Emergency Department complaining of hyperglycemia with associated headache, nausea, back pain, and generalized weakness for the past 2 days. Pt reports her CBG has been around 300-400 the past couple days and was at 500 yesterday.  Pt has been a type II diabetic for the past 12 years; she takes 40 units of Humalog50/50 and 1,000 mg of Metformin every morning and evening. Pt reports she was diagnosed with diabetic ketoacidosis 2 years ago.   [facility] Phillips Odor at [facility] is the pt's PCP.  Past Medical History  Diagnosis Date  . Diabetes mellitus   . Depression   . Constipation   . Fibromyalgia   . Anemia     Past Surgical History  Procedure Date  . Knee surgery   . Cystectomy   . Shoulder surgery     right  . Endometrial ablation     Family History  Problem Relation Age of Onset  . Hypertension Mother   . Cancer Mother   . Asthma Sister   . Asthma Brother   . Stomach cancer      distant relative on dad's side of family  . Colon cancer Neg Hx     History  Substance Use Topics  . Smoking status: Former Smoker -- 0.5 packs/day for 15 years    Types: Cigarettes    Quit date: 04/29/1991  . Smokeless tobacco: Never Used  . Alcohol Use: 0.6 oz/week    1 Glasses of wine per week    OB History    Grav Para Term Preterm Abortions TAB SAB Ect Mult Living   5 4 4  1  1   4       Review of Systems A complete 10  system review of systems was obtained and all systems are negative except as noted in the HPI and PMH.    Allergies  Review of patient's allergies indicates no known allergies.  Home Medications   Current Outpatient Rx  Name Route Sig Dispense Refill  . ACETAMINOPHEN ER 650 MG PO TBCR Oral Take 650 mg by mouth every 8 (eight) hours as needed. For pain    . ASPIRIN EC 81 MG PO TBEC Oral Take 81 mg by mouth daily.    Marland Kitchen DIAZEPAM 10 MG PO TABS Oral Take 10 mg by mouth daily as needed. For sleep or anxiety    . FERROUS SULFATE 325 (65 FE) MG PO TABS Oral Take 325 mg by mouth every other day.     Marland Kitchen FEXOFENADINE HCL 180 MG PO TABS Oral Take 180 mg by mouth daily.    Marland Kitchen GABAPENTIN 300 MG PO CAPS Oral Take 300 mg by mouth 3 (three) times daily.     Marland Kitchen HYDROCHLOROTHIAZIDE 25 MG PO TABS Oral Take 25 mg by mouth daily.     . INSULIN LISPRO PROT & LISPRO (50-50) 100 UNIT/ML [place] SUSP Subcutaneous Inject 30 Units into the skin 3 (three) times daily.    Marland Kitchen  LOSARTAN POTASSIUM 50 MG PO TABS Oral Take 50 mg by mouth daily.     . MELOXICAM 15 MG PO TABS Oral Take 15 mg by mouth daily.     Marland Kitchen METFORMIN HCL 1000 MG PO TABS Oral Take 1,000 mg by mouth 2 (two) times daily with a meal.     . METHOCARBAMOL 500 MG PO TABS  Take two tablets po TID prn muscle spasms 42 tablet 0  . OXYCODONE-ACETAMINOPHEN 10-500 MG PO TABS Oral Take 1 tablet by mouth every 6 (six) hours as needed. For pain    . PHENTERMINE HCL 37.5 MG PO TABS Oral Take 37.5 mg by mouth daily before breakfast.    . POTASSIUM CHLORIDE CRYS ER 20 MEQ PO TBCR Oral Take 1 tablet (20 mEq total) by mouth 2 (two) times daily. 4 tablet 0  . TRAZODONE HCL 100 MG PO TABS Oral Take 100 mg by mouth at bedtime.    Marland Kitchen VITAMIN B-12 1000 MCG PO TABS Oral Take 1,000 mcg by mouth daily.      Triage Vitals: BP 135/82  Pulse 99  Temp 98.1 F (36.7 C) (Oral)  Resp 20  Ht 5' 5.5" (1.664 m)  SpO2 100%  Physical Exam  Nursing note and vitals reviewed. Constitutional: She  is oriented to person, place, and time. She appears well-developed and well-nourished.       Pt appears slightly dehydrated with mildly parched lips.  HENT:  Head: Normocephalic and atraumatic.  Eyes: Conjunctivae normal and EOM are normal. Pupils are equal, round, and reactive to light.  Neck: Normal range of motion. Neck supple.  Cardiovascular: Normal rate, regular rhythm and normal heart sounds.   Pulmonary/Chest: Effort normal and breath sounds normal.  Abdominal: Soft. Bowel sounds are normal.  Musculoskeletal: Normal range of motion.  Neurological: She is alert and oriented to person, place, and time.  Skin: Skin is warm and dry.  Psychiatric: She has a normal mood and affect.    ED Course  Procedures (including critical care time) DIAGNOSTIC STUDIES: Oxygen Saturation is 100% on room air, normal by my interpretation.    COORDINATION OF CARE: 17:15--I evaluated the patient and we discussed a treatment plan including blood work, IV fluids, insulin, nausea medication, and pain medication to which the pt agreed. I informed the pt that she does not seem to be in dka.  Results for orders placed during the hospital encounter of 04/13/12  CBC WITH DIFFERENTIAL      Component Value Range   WBC 6.3  4.0 - 10.5 K/uL   RBC 3.77 (*) 3.87 - 5.11 MIL/uL   Hemoglobin 11.6 (*) 12.0 - 15.0 g/dL   HCT 95.6 (*) 21.3 - 08.6 %   MCV 95.2  78.0 - 100.0 fL   MCH 30.8  26.0 - 34.0 pg   MCHC 32.3  30.0 - 36.0 g/dL   RDW 57.8  46.9 - 62.9 %   Platelets 396  150 - 400 K/uL   Neutrophils Relative 71  43 - 77 %   Neutro Abs 4.5  1.7 - 7.7 K/uL   Lymphocytes Relative 22  12 - 46 %   Lymphs Abs 1.4  0.7 - 4.0 K/uL   Monocytes Relative 6  3 - 12 %   Monocytes Absolute 0.4  0.1 - 1.0 K/uL   Eosinophils Relative 1  0 - 5 %   Eosinophils Absolute 0.1  0.0 - 0.7 K/uL   Basophils Relative 0  0 - 1 %  Basophils Absolute 0.0  0.0 - 0.1 K/uL  BASIC METABOLIC PANEL      Component Value Range   Sodium  124 (*) 135 - 145 mEq/L   Potassium 5.3 (*) 3.5 - 5.1 mEq/L   Chloride 83 (*) 96 - 112 mEq/L   CO2 12 (*) 19 - 32 mEq/L   Glucose, Bld 607 (*) 70 - 99 mg/dL   BUN 23  6 - 23 mg/dL   Creatinine, Ser 9.60  0.50 - 1.10 mg/dL   Calcium 9.4  8.4 - 45.4 mg/dL   GFR calc non Af Amer >90  >90 mL/min   GFR calc Af Amer >90  >90 mL/min  URINALYSIS, ROUTINE W REFLEX MICROSCOPIC      Component Value Range   Color, Urine STRAW (*) YELLOW   APPearance HAZY (*) CLEAR   Specific Gravity, Urine 1.020  1.005 - 1.030   pH 5.5  5.0 - 8.0   Glucose, UA >1000 (*) NEGATIVE mg/dL   Hgb urine dipstick TRACE (*) NEGATIVE   Bilirubin Urine NEGATIVE  NEGATIVE   Ketones, ur >80 (*) NEGATIVE mg/dL   Protein, ur NEGATIVE  NEGATIVE mg/dL   Urobilinogen, UA 0.2  0.0 - 1.0 mg/dL   Nitrite NEGATIVE  NEGATIVE   Leukocytes, UA NEGATIVE  NEGATIVE  GLUCOSE, CAPILLARY      Component Value Range   Glucose-Capillary 557 (*) 70 - 99 mg/dL   Comment 1 Notify RN    URINE MICROSCOPIC-ADD ON      Component Value Range   Squamous Epithelial / LPF FEW (*) RARE   WBC, UA 7-10  <3 WBC/hpf   RBC / HPF 7-10  <3 RBC/hpf   Bacteria, UA RARE  RARE       No diagnosis found. CRITICAL CARE Performed by: [name]  ?  Total critical care time: 30  Critical care time was exclusive of separately billable procedures and treating other patients.  Critical care was necessary to treat or prevent imminent or life-threatening deterioration.  Critical care was time spent personally by me on the following activities: development of treatment plan with patient and/or surrogate as well as nursing, discussions with consultants, evaluation of patient's response to treatment, examination of patient, obtaining history from patient or surrogate, ordering and performing treatments and interventions, ordering and review of laboratory studies, ordering and review of radiographic studies, pulse oximetry and re-evaluation of patient's  condition.  MDM  History and physical consistent with diabetic ketoacidosis.  Glucose 607. CO2 12. Vigorous IV hydration and IV insulin. Admit.      Judy Hutching, MD 04/13/12 2028

## 2012-04-13 NOTE — ED Notes (Signed)
CRITICAL VALUE ALERT  Critical value received:  Glucose 607  Date of notification:  04/13/12  Time of notification:  1828  Critical value read back:yes  Nurse who received alert:  Wilkie Aye Sidney Kann,rn  MD notified (1st page):  Dr Adriana Simas  Time of first page:  1828  MD notified (2nd page):  Time of second page:  Responding MD:  Dr Adriana Simas  Time MD responded:  [phone]

## 2012-04-13 NOTE — ED Notes (Signed)
C/o headache and generalized body aches.

## 2012-04-13 NOTE — ED Notes (Signed)
Blood sugar 237

## 2012-04-13 NOTE — H&P (Signed)
[facility] History and Physical  [name]  ZOX:[id number]  DOB: 09-21-1960   DOA: 04/13/2012   PCP:   Colette Ribas, MD  Endocrinologist: Dr. Fransico Him  Chief Complaint:  Elevated blood sugar for 2 days  HPI: [name] is an 51 y.o. female.   Middle-aged [place] American lady, with a history of diabetes type 2, episode of diabetic ketoacidosis with coma 2 years ago, has been running very high blood sugars in the 400s for the past 2 days and having vague ill health, she vomited a couple of times and has been having vague abdominal pain. Eventually she came to the emergency room to be evaluated was found to have a blood sugar greater than 600, and has become extremely distressed because she doesn't want to do this as she was in the past.  She has not seen her endocrinologist for about a month, and indicates she has missed appointments; her insulin is being adjusted so achieve optimum control, and she is currently taking Humalog 50-50, twice daily, as well as metformin twice daily.  Additionally patient has been taking phentermine, for weight loss, and has lost 30 pounds in the past month. He has not been eating a lot because she's been feeling anorexic; os of the because of the phentermine.  In the emergency room patient was found to have ketones in her urine, to have a  elevated  Potassium, low-sodium, low CO2, and a high anion gap.  She is been given 2 L of IV fluids and the hospitalist service and called to assist  She does have a history of depression and fibromyalgia, diabetic neuropathy  Rewiew of Systems:   All systems negative except as marked bold or noted in the HPI;  Constitutional: Negative for malaise, . ;  Eyes: Negative for eye pain, redness and discharge. ;  ENMT: Negative for ear pain, hoarseness, nasal congestion, sinus pressure and sore throat. ;  Cardiovascular: Negative for chest pain, palpitations, diaphoresis, dyspnea and peripheral  edema. ;  Respiratory: Negative for cough, hemoptysis, wheezing and stridor. ;  Gastrointestinal: Negative for , diarrhea, constipation, , melena, blood in stool, hematemesis, jaundice and rectal bleeding. Genitourinary: Negative for frequency, dysuria, incontinence,flank pain and hematuria; Musculoskeletal: . Negative for swelling and trauma.;  Skin: . Negative for pruritus, rash, abrasions, bruising and skin lesion.; ulcerations Neuro: Negative for neck stiffness. Negative for weakness, altered level of consciousness , altered mental status, extremity weakness, involuntary movement, seizure and syncope.  Psych: negative for  panic attacks, hallucinations, paranoia, suicidal or homicidal ideation    Past Medical History  Diagnosis Date  . Diabetes mellitus   . Depression   . Constipation   . Fibromyalgia   . Anemia     Past Surgical History  Procedure Date  . Knee surgery   . Cystectomy   . Shoulder surgery     right  . Endometrial ablation     Medications:  HOME MEDS: Prior to Admission medications   Medication Sig Start Date End Date Taking? Authorizing Provider  acetaminophen (TYLENOL ARTHRITIS PAIN) 650 MG CR tablet Take 650 mg by mouth every 8 (eight) hours as needed. For pain   Yes Historical Provider, MD  aspirin EC 81 MG tablet Take 81 mg by mouth daily.   Yes Historical Provider, MD  diazepam (VALIUM) 10 MG tablet Take 10 mg by mouth daily as needed. For sleep or anxiety   Yes Historical Provider, MD  ferrous sulfate 325 (65 FE) MG tablet Take  325 mg by mouth every other day.    Yes Historical Provider, MD  fexofenadine (ALLEGRA) 180 MG tablet Take 180 mg by mouth daily.   Yes Historical Provider, MD  gabapentin (NEURONTIN) 300 MG capsule Take 300 mg by mouth 3 (three) times daily.  12/18/11  Yes Historical Provider, MD  hydrochlorothiazide (HYDRODIURIL) 25 MG tablet Take 25 mg by mouth daily.  12/13/11  Yes Historical Provider, MD  insulin lispro protamine-insulin  lispro (HUMALOG 50/50) (50-50) 100 UNIT/ML SUSP Inject 30 Units into the skin 3 (three) times daily.   Yes Historical Provider, MD  losartan (COZAAR) 50 MG tablet Take 50 mg by mouth daily.  12/13/11  Yes Historical Provider, MD  meloxicam (MOBIC) 15 MG tablet Take 15 mg by mouth daily.  12/18/11  Yes Historical Provider, MD  metFORMIN (GLUCOPHAGE) 1000 MG tablet Take 1,000 mg by mouth 2 (two) times daily with a meal.    Yes Historical Provider, MD  methocarbamol (ROBAXIN) 500 MG tablet Take 1,000 mg by mouth 3 (three) times daily. Muscle spams   Yes Historical Provider, MD  oxycodone-acetaminophen (NARVOX) 10-500 MG per tablet Take 1 tablet by mouth every 6 (six) hours as needed. For pain   Yes Historical Provider, MD  phentermine (ADIPEX-P) 37.5 MG tablet Take 37.5 mg by mouth daily before breakfast.   Yes Historical Provider, MD  potassium chloride SA (K-DUR,KLOR-CON) 20 MEQ tablet Take 1 tablet (20 mEq total) by mouth 2 (two) times daily. 05/03/11 05/02/12 Yes Jonny Ruiz, MD  traZODone (DESYREL) 100 MG tablet Take 100 mg by mouth at bedtime.   Yes Historical Provider, MD  vitamin B-12 (CYANOCOBALAMIN) 1000 MCG tablet Take 1,000 mcg by mouth daily.   Yes Historical Provider, MD     Allergies:  No Known Allergies  Social History:   reports that she quit smoking about 20 years ago. Her smoking use included Cigarettes. She has a 7.5 pack-year smoking history. She has never used smokeless tobacco. She reports that she drinks about .6 ounces of alcohol per week. She reports that she uses illicit drugs (Marijuana and Cocaine).  Family History: Family History  Problem Relation Age of Onset  . Hypertension Mother   . Cancer Mother   . Asthma Sister   . Asthma Brother   . Stomach cancer      distant relative on dad's side of family  . Colon cancer Neg Hx      Physical Exam: Filed Vitals:   04/13/12 1655  BP: 135/82  Pulse: 99  Temp: 98.1 F (36.7 C)  TempSrc: Oral  Resp: 20    Height: 5' 5.5" (1.664 m)  SpO2: 100%   Blood pressure 135/82, pulse 99, temperature 98.1 F (36.7 C), temperature source Oral, resp. rate 20, height 5' 5.5" (1.664 m), SpO2 100.00%.  GEN:  Pleasant but tearful middle-aged African American lady lying in the stretcher; not particularly ill-looking; cooperative with exam PSYCH:  alert and oriented x4; does appear depressed; her reaction is out of proportion to the degree of illness HEENT: Mucous membranes pink, dry, and anicteric; PERRLA; EOM intact; no cervical lymphadenopathy nor thyromegaly or carotid bruit; no JVD; Breasts:: Not examined CHEST WALL: No tenderness CHEST: Normal respiration, clear to auscultation bilaterally HEART: Regular rate and rhythm; no murmurs rubs or gallops BACK: No kyphosis or scoliosis; no CVA tenderness ABDOMEN: Obese, soft non-tender; no masses, no organomegaly, normal abdominal bowel sounds;  no intertriginous candida. Rectal Exam: Not done EXTREMITIES: No bone or joint deformity; age-appropriate arthropathy  of the hands and knees; no edema; no ulcerations. Genitalia: not examined PULSES: 2+ and symmetric SKIN: Normal hydration no rash or ulceration CNS: Cranial nerves 2-12 grossly intact no focal lateralizing neurologic deficit   Labs on Admission:  Basic Metabolic Panel:  Lab 04/13/12 6213  NA 124*  K 5.3*  CL 83*  CO2 12*  GLUCOSE 607*  BUN 23  CREATININE 0.75  CALCIUM 9.4  MG --  PHOS --   Liver Function Tests: No results found for this basename: AST:5,ALT:5,ALKPHOS:5,BILITOT:5,PROT:5,ALBUMIN:5 in the last 168 hours No results found for this basename: LIPASE:5,AMYLASE:5 in the last 168 hours No results found for this basename: AMMONIA:5 in the last 168 hours CBC:  Lab 04/13/12 1750  WBC 6.3  NEUTROABS 4.5  HGB 11.6*  HCT 35.9*  MCV 95.2  PLT 396   Cardiac Enzymes: No results found for this basename: CKTOTAL:5,CKMB:5,CKMBINDEX:5,TROPONINI:5 in the last 168 hours BNP: No  components found with this basename: POCBNP:5 D-dimer: No components found with this basename: D-DIMER:5 CBG:  Lab 04/13/12 2005 04/13/12 1803  GLUCAP 318* 557*    Radiological Exams on Admission: No results found.    Assessment/Plan Present on Admission:  .DKA (diabetic ketoacidoses); although her ketosis may be a result of a rapid weight loss  .Hyperkalemia .Anemia .Depression .Diabetic neuropathy   PLAN: We'll admit this lady to the step down unit for insulin therapy to treat her ketosis and hypokalemia. We'll continue to hydrate vigorously Reassured her that her disease is relatively mild, and she should be fine by the morning Consult the importance of finding a stress management technique, and recommend transcendental meditation.  Other plans as per orders.  Code Status: FULL CODE  Family Communication: Mother, Esperanza Sheets [phone] (c); Daughter, Chyrl Civatte Fox:[phone] (h) Disposition Plan:  Home tomorow if stable to f/u with Endocrinologist.   Giliana Vantil Nocturnist [facility] Pager [phone]   04/13/2012, 8:46 PM

## 2012-04-13 NOTE — ED Notes (Signed)
Patient ambulatory to restroom  ?

## 2012-04-13 NOTE — ED Notes (Signed)
Report to Laury Deep, RN in ICU. She will start insulin drip in ICU since I just received it.

## 2012-04-13 NOTE — ED Notes (Signed)
Pt reports that her blood sugar has been reading high for 2 days, now having headache and nausea. Tearful in exam room. H/o of dka

## 2012-04-14 DIAGNOSIS — Z789 Other specified health status: Secondary | ICD-10-CM

## 2012-04-14 LAB — GLUCOSE, CAPILLARY
Glucose-Capillary: 128 mg/dL — ABNORMAL HIGH (ref 70–99)
Glucose-Capillary: 150 mg/dL — ABNORMAL HIGH (ref 70–99)
Glucose-Capillary: 153 mg/dL — ABNORMAL HIGH (ref 70–99)
Glucose-Capillary: 154 mg/dL — ABNORMAL HIGH (ref 70–99)
Glucose-Capillary: 160 mg/dL — ABNORMAL HIGH (ref 70–99)

## 2012-04-14 LAB — BASIC METABOLIC PANEL
CO2: 22 mEq/L (ref 19–32)
CO2: 25 mEq/L (ref 19–32)
Calcium: 8.3 mg/dL — ABNORMAL LOW (ref 8.4–10.5)
Chloride: 102 mEq/L (ref 96–112)
Creatinine, Ser: 0.6 mg/dL (ref 0.50–1.10)
GFR calc Af Amer: >90 mL/min (ref >90)
GFR calc non Af Amer: >90 mL/min (ref >90)
Glucose, Bld: 153 mg/dL — ABNORMAL HIGH (ref 70–99)
Potassium: 3.9 mEq/L (ref 3.5–5.1)
Sodium: 133 mEq/L — ABNORMAL LOW (ref 135–145)
Sodium: 137 mEq/L (ref 135–145)

## 2012-04-14 LAB — MRSA PCR SCREENING: MRSA by PCR: NEGATIVE

## 2012-04-14 LAB — CBC
HCT: 30.6 % — ABNORMAL LOW (ref 36.0–46.0)
Hemoglobin: 10.1 g/dL — ABNORMAL LOW (ref 12.0–15.0)
MCH: 30.7 pg (ref 26.0–34.0)
MCV: 93 fL (ref 78.0–100.0)
RBC: 3.29 MIL/uL — ABNORMAL LOW (ref 3.87–5.11)

## 2012-04-14 LAB — RAPID URINE DRUG SCREEN, HOSP PERFORMED
Barbiturates: NOT DETECTED
Cocaine: NOT DETECTED
Tetrahydrocannabinol: NOT DETECTED

## 2012-04-14 LAB — HEMOGLOBIN A1C: Hgb A1c MFr Bld: 10.9 % — ABNORMAL HIGH (ref <5.7)

## 2012-04-14 MED ORDER — INSULIN ASPART 100 UNIT/ML ~~LOC~~ SOLN: 0.0000 [IU] | Freq: Every day | SUBCUTANEOUS | Status: DC

## 2012-04-14 MED ORDER — INSULIN GLARGINE 100 UNIT/ML ~~LOC~~ SOLN
15.0000 [IU] | Freq: Once | SUBCUTANEOUS | Status: AC
  Administered 2012-04-14: 15 [IU] via SUBCUTANEOUS

## 2012-04-14 MED ORDER — INSULIN ASPART 100 UNIT/ML ~~LOC~~ SOLN
0.0000 [IU] | Freq: Three times a day (TID) | SUBCUTANEOUS | Status: DC
  Administered 2012-04-14: 1 [IU] via SUBCUTANEOUS

## 2012-04-14 NOTE — Discharge Summary (Signed)
Physician Discharge Summary  [name] JYN:[id number] DOB: 11-10-1960 DOA: 04/13/2012  PCP: Colette Ribas, MD  Admit date: 04/13/2012 Discharge date: 04/14/2012  Recommendations for Outpatient Follow-up:  1. Follow with endocrinologist, Dr. Fransico Him  within a week or so.   Discharge Diagnoses:  1. Diabetic ketoacidosis, resolved. 2. Uncontrolled diabetes mellitus type 2. 3. Obesity. 4. Depression.   Discharge Condition: Stable and improved.  Diet recommendation: Carbohydrate modified diet focusing on low glycemic index foods.  Filed Weights   04/13/12 2200 04/14/12 0500  Weight: 78 kg (171 lb 15.3 oz) 78 kg (171 lb 15.3 oz)    History of present illness:  This 51 year old lady presented to the hospital with symptoms of vague ill health and elevated blood sugar. Please see initial history as outlined below: HPI:  [name] is an 51 y.o. female. Middle-aged [place] American lady, with a history of diabetes type 2, episode of diabetic ketoacidosis with coma 2 years ago, has been running very high blood sugars in the 400s for the past 2 days and having vague ill health, she vomited a couple of times and has been having vague abdominal pain. Eventually she came to the emergency room to be evaluated was found to have a blood sugar greater than 600, and has become extremely distressed because she doesn't want to do this as she was in the past.  She has not seen her endocrinologist for about a month, and indicates she has missed appointments; her insulin is being adjusted so achieve optimum control, and she is currently taking Humalog 50-50, twice daily, as well as metformin twice daily.  Additionally patient has been taking phentermine, for weight loss, and has lost 30 pounds in the past month.  He has not been eating a lot because she's been feeling anorexic; os of the because of the phentermine.  In the emergency room patient was found to have ketones in her urine, to  have a elevated Potassium, low-sodium, low CO2, and a high anion gap.  She is been given 2 L of IV fluids and the hospitalist service and called to assist  She does have a history of depression and fibromyalgia, diabetic neuropathy  Hospital Course:  Patient was treated aggressively in the hospital with intravenous fluids and intravenous insulin. Her blood sugars came down quite nicely and she began to feel much better. Her anion gap returned to normal and her bicarbonate also returned to normal. She feels back to her usual self this morning. We discussed at length strategies for a long-lasting weight loss program which would also result in better diabetic control.  Procedures:  None.  Consultations:  None.  Discharge Exam: Filed Vitals:   04/14/12 0300 04/14/12 0400 04/14/12 0500 04/14/12 0600  BP: 103/60 100/59 96/64 95/51   Pulse: 82 76 78 67  Temp:  97.8 F (36.6 C)    TempSrc:  Oral    Resp: 12 10 18 11   Height:      Weight:   78 kg (171 lb 15.3 oz)   SpO2: 92% 94% 97% 96%    General: She looks systemically well. She is not clinically dehydrated. She is not toxic or septic. Cardiovascular: Heart sounds are present and normal without murmurs. Respiratory: Lung fields are clear. She is alert and orientated without any focal neurological signs.  Discharge Instructions  Discharge Orders    Future Orders Please Complete By Expires   Diet - low sodium heart healthy      Increase activity slowly  Medication List     As of 04/14/2012  7:45 AM    STOP taking these medications         phentermine 37.5 MG tablet   Commonly known as: ADIPEX-P      TAKE these medications         aspirin EC 81 MG tablet   Take 81 mg by mouth daily.      diazepam 10 MG tablet   Commonly known as: VALIUM   Take 10 mg by mouth daily as needed. For sleep or anxiety      ferrous sulfate 325 (65 FE) MG tablet   Take 325 mg by mouth every other day.      fexofenadine 180 MG  tablet   Commonly known as: ALLEGRA   Take 180 mg by mouth daily.      gabapentin 300 MG capsule   Commonly known as: NEURONTIN   Take 300 mg by mouth 3 (three) times daily.      hydrochlorothiazide 25 MG tablet   Commonly known as: HYDRODIURIL   Take 25 mg by mouth daily.      insulin lispro protamine-insulin lispro (50-50) 100 UNIT/ML Susp   Commonly known as: HUMALOG 50/50   Inject 30 Units into the skin 3 (three) times daily.      losartan 50 MG tablet   Commonly known as: COZAAR   Take 50 mg by mouth daily.      meloxicam 15 MG tablet   Commonly known as: MOBIC   Take 15 mg by mouth daily.      metFORMIN 1000 MG tablet   Commonly known as: GLUCOPHAGE   Take 1,000 mg by mouth 2 (two) times daily with a meal.      methocarbamol 500 MG tablet   Commonly known as: ROBAXIN   Take 1,000 mg by mouth 3 (three) times daily. Muscle spams      oxycodone-acetaminophen 10-500 MG per tablet   Commonly known as: NARVOX   Take 1 tablet by mouth every 6 (six) hours as needed. For pain      potassium chloride SA 20 MEQ tablet   Commonly known as: K-DUR,KLOR-CON   Take 1 tablet (20 mEq total) by mouth 2 (two) times daily.      traZODone 100 MG tablet   Commonly known as: DESYREL   Take 100 mg by mouth at bedtime.      TYLENOL ARTHRITIS PAIN 650 MG CR tablet   Generic drug: acetaminophen   Take 650 mg by mouth every 8 (eight) hours as needed. For pain      vitamin B-12 1000 MCG tablet   Commonly known as: CYANOCOBALAMIN   Take 1,000 mcg by mouth daily.          The results of significant diagnostics from this hospitalization (including imaging, microbiology, ancillary and laboratory) are listed below for reference.    Significant Diagnostic Studies: No results found.  Microbiology: Recent Results (from the past 240 hour(s))  MRSA PCR SCREENING     Status: Normal   Collection Time   04/13/12  9:58 PM      Component Value Range Status Comment   MRSA by PCR NEGATIVE   NEGATIVE Final      Labs: Basic Metabolic Panel:  Lab 04/14/12 9604 04/14/12 0038 04/13/12 2033 04/13/12 1750  NA 137 133* 134* 124*  K 3.9 3.7 3.9 5.3*  CL 105 102 98 83*  CO2 25 22 14* 12*  GLUCOSE 153* 150*  319* 607*  BUN 11 14 19 23   CREATININE 0.59 0.60 0.70 0.75  CALCIUM 8.3* 8.2* 8.6 9.4  MG -- -- -- --  PHOS -- -- -- --       CBC:  Lab 04/14/12 0434 04/13/12 1750  WBC 4.3 6.3  NEUTROABS -- 4.5  HGB 10.1* 11.6*  HCT 30.6* 35.9*  MCV 93.0 95.2  PLT 370 396     CBG:  Lab 04/14/12 0739 04/14/12 0536 04/14/12 0431 04/14/12 0338 04/14/12 0235  GLUCAP 142* 160* 142* 153* 154*    Time coordinating discharge: *Less than 30 minutes  Signed:  Kallan Merrick C  [facility] 04/14/2012, 7:45 AM

## 2012-04-14 NOTE — Progress Notes (Signed)
Pt to be discharged home per MD order. When pt questioned if she had insulin at home and supplies to check her blood sugar she stated she did not have any more Insulin. Dr Karilyn Cota made aware and prescription given to patient for insulin. All discharge instructions and medications gone over with patient using the teach back method. All questions and concerns answered. Pt discharged home with her mother mother. NT to walk patient out.

## 2012-04-14 NOTE — Progress Notes (Signed)
Text paged Dr. Orvan Falconer with regards to transitioning off of glucostabilizer/insulin gtt; awaiting return call/orders to be placed at this time.

## 2012-04-29 ENCOUNTER — Encounter: Payer: Self-pay | Admitting: *Deleted

## 2012-05-12 ENCOUNTER — Other Ambulatory Visit: Payer: Self-pay

## 2012-05-12 DIAGNOSIS — D649 Anemia, unspecified: Secondary | ICD-10-CM

## 2012-05-13 ENCOUNTER — Other Ambulatory Visit (HOSPITAL_COMMUNITY)
Admission: RE | Admit: 2012-05-13 | Discharge: 2012-05-13 | Disposition: A | Discharge status: Home / Self Care | Payer: Medicaid Other | Source: Ambulatory Visit | Attending: Obstetrics & Gynecology | Admitting: Obstetrics & Gynecology

## 2012-05-13 ENCOUNTER — Other Ambulatory Visit: Payer: Self-pay | Admitting: Obstetrics & Gynecology

## 2012-05-13 DIAGNOSIS — Z01419 Encounter for gynecological examination (general) (routine) without abnormal findings: Secondary | ICD-10-CM | POA: Insufficient documentation

## 2012-05-18 ENCOUNTER — Emergency Department (HOSPITAL_COMMUNITY)
Admission: EM | Admit: 2012-05-18 | Discharge: 2012-05-18 | Disposition: A | Discharge status: Home / Self Care | Payer: Medicaid Other | Attending: Emergency Medicine | Admitting: Emergency Medicine

## 2012-05-18 ENCOUNTER — Encounter (HOSPITAL_COMMUNITY): Payer: Self-pay | Admitting: Emergency Medicine

## 2012-05-18 DIAGNOSIS — F329 Major depressive disorder, single episode, unspecified: Secondary | ICD-10-CM | POA: Insufficient documentation

## 2012-05-18 DIAGNOSIS — T22219A Burn of second degree of unspecified forearm, initial encounter: Secondary | ICD-10-CM | POA: Insufficient documentation

## 2012-05-18 DIAGNOSIS — Z794 Long term (current) use of insulin: Secondary | ICD-10-CM | POA: Insufficient documentation

## 2012-05-18 DIAGNOSIS — D649 Anemia, unspecified: Secondary | ICD-10-CM | POA: Insufficient documentation

## 2012-05-18 DIAGNOSIS — Y9289 Other specified places as the place of occurrence of the external cause: Secondary | ICD-10-CM | POA: Insufficient documentation

## 2012-05-18 DIAGNOSIS — F3289 Other specified depressive episodes: Secondary | ICD-10-CM | POA: Insufficient documentation

## 2012-05-18 DIAGNOSIS — Z8719 Personal history of other diseases of the digestive system: Secondary | ICD-10-CM | POA: Insufficient documentation

## 2012-05-18 DIAGNOSIS — K5900 Constipation, unspecified: Secondary | ICD-10-CM | POA: Insufficient documentation

## 2012-05-18 DIAGNOSIS — X19XXXA Contact with other heat and hot substances, initial encounter: Secondary | ICD-10-CM | POA: Insufficient documentation

## 2012-05-18 DIAGNOSIS — E119 Type 2 diabetes mellitus without complications: Secondary | ICD-10-CM | POA: Insufficient documentation

## 2012-05-18 DIAGNOSIS — Z87891 Personal history of nicotine dependence: Secondary | ICD-10-CM | POA: Insufficient documentation

## 2012-05-18 DIAGNOSIS — Z23 Encounter for immunization: Secondary | ICD-10-CM | POA: Insufficient documentation

## 2012-05-18 DIAGNOSIS — Z7982 Long term (current) use of aspirin: Secondary | ICD-10-CM | POA: Insufficient documentation

## 2012-05-18 DIAGNOSIS — Y93G9 Activity, other involving cooking and grilling: Secondary | ICD-10-CM | POA: Insufficient documentation

## 2012-05-18 DIAGNOSIS — Z79899 Other long term (current) drug therapy: Secondary | ICD-10-CM | POA: Insufficient documentation

## 2012-05-18 MED ORDER — ONDANSETRON HCL 4 MG PO TABS
4.0000 mg | ORAL_TABLET | Freq: Once | ORAL | Status: AC
  Administered 2012-05-18: 4 mg via ORAL
  Filled 2012-05-18: qty 1

## 2012-05-18 MED ORDER — SILVER SULFADIAZINE 1 % EX CREA
TOPICAL_CREAM | Freq: Once | CUTANEOUS | Status: AC
  Administered 2012-05-18: 21:00:00 via TOPICAL
  Filled 2012-05-18: qty 50

## 2012-05-18 MED ORDER — SILVER SULFADIAZINE 1 % EX CREA: TOPICAL_CREAM | Freq: Every day | CUTANEOUS | Status: DC

## 2012-05-18 MED ORDER — AMOXICILLIN-POT CLAVULANATE 875-125 MG PO TABS
1.0000 | ORAL_TABLET | Freq: Once | ORAL | Status: AC
  Administered 2012-05-18: 1 via ORAL
  Filled 2012-05-18: qty 1

## 2012-05-18 MED ORDER — AMOXICILLIN-POT CLAVULANATE 875-125 MG PO TABS: 1.0000 | ORAL_TABLET | Freq: Two times a day (BID) | ORAL | Status: DC

## 2012-05-18 MED ORDER — TETANUS-DIPHTH-ACELL PERTUSSIS 5-2.5-18.5 LF-MCG/0.5 IM SUSP
0.5000 mL | Freq: Once | INTRAMUSCULAR | Status: AC
  Administered 2012-05-18: 0.5 mL via INTRAMUSCULAR
  Filled 2012-05-18: qty 0.5

## 2012-05-18 NOTE — ED Notes (Signed)
Patient with no complaints at this time. Respirations even and unlabored. Skin warm/dry. Discharge instructions reviewed with patient at this time. Patient given opportunity to voice concerns/ask questions. Patient discharged at this time and left Emergency Department with steady gait.   
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
## 2012-05-18 NOTE — ED Provider Notes (Signed)
Medical screening examination/treatment/procedure(s) were performed by non-physician practitioner and as supervising physician I was immediately available for consultation/collaboration.   Belma Dyches L Rolen Conger, MD 05/18/12 2245 

## 2012-05-19 NOTE — Progress Notes (Signed)
Reminder letter sent to pt with lab orders from Gerrit Halls, NP, for CBC, Iron, Ferritin, TIBC.

## 2012-08-18 ENCOUNTER — Encounter: Payer: Self-pay | Admitting: Gastroenterology

## 2012-08-19 ENCOUNTER — Telehealth: Payer: Self-pay | Admitting: Gastroenterology

## 2012-08-19 ENCOUNTER — Ambulatory Visit: Payer: Medicaid Other | Admitting: Gastroenterology

## 2012-08-19 NOTE — Telephone Encounter (Signed)
Pt was a no show

## 2013-02-02 ENCOUNTER — Encounter (HOSPITAL_COMMUNITY): Payer: Self-pay | Admitting: *Deleted

## 2013-02-02 ENCOUNTER — Emergency Department (HOSPITAL_COMMUNITY)
Admission: EM | Admit: 2013-02-02 | Discharge: 2013-02-02 | Disposition: A | Discharge status: Home / Self Care | Payer: Medicaid Other | Attending: Emergency Medicine | Admitting: Emergency Medicine

## 2013-02-02 DIAGNOSIS — M5416 Radiculopathy, lumbar region: Secondary | ICD-10-CM

## 2013-02-02 DIAGNOSIS — Z79899 Other long term (current) drug therapy: Secondary | ICD-10-CM | POA: Insufficient documentation

## 2013-02-02 DIAGNOSIS — IMO0002 Reserved for concepts with insufficient information to code with codable children: Secondary | ICD-10-CM | POA: Insufficient documentation

## 2013-02-02 DIAGNOSIS — Z7982 Long term (current) use of aspirin: Secondary | ICD-10-CM | POA: Insufficient documentation

## 2013-02-02 DIAGNOSIS — G8929 Other chronic pain: Secondary | ICD-10-CM | POA: Insufficient documentation

## 2013-02-02 DIAGNOSIS — F329 Major depressive disorder, single episode, unspecified: Secondary | ICD-10-CM | POA: Insufficient documentation

## 2013-02-02 DIAGNOSIS — Z862 Personal history of diseases of the blood and blood-forming organs and certain disorders involving the immune mechanism: Secondary | ICD-10-CM | POA: Insufficient documentation

## 2013-02-02 DIAGNOSIS — IMO0001 Reserved for inherently not codable concepts without codable children: Secondary | ICD-10-CM | POA: Insufficient documentation

## 2013-02-02 DIAGNOSIS — E119 Type 2 diabetes mellitus without complications: Secondary | ICD-10-CM | POA: Insufficient documentation

## 2013-02-02 DIAGNOSIS — M79609 Pain in unspecified limb: Secondary | ICD-10-CM | POA: Insufficient documentation

## 2013-02-02 DIAGNOSIS — Z87891 Personal history of nicotine dependence: Secondary | ICD-10-CM | POA: Insufficient documentation

## 2013-02-02 DIAGNOSIS — F3289 Other specified depressive episodes: Secondary | ICD-10-CM | POA: Insufficient documentation

## 2013-02-02 LAB — URINALYSIS, ROUTINE W REFLEX MICROSCOPIC
Bilirubin Urine: NEGATIVE
Glucose, UA: NEGATIVE mg/dL
Ketones, ur: NEGATIVE mg/dL
Specific Gravity, Urine: 1.015 (ref 1.005–1.030)

## 2013-02-02 MED ORDER — CYCLOBENZAPRINE HCL 10 MG PO TABS
10.0000 mg | ORAL_TABLET | Freq: Once | ORAL | Status: AC
  Administered 2013-02-02: 10 mg via ORAL
  Filled 2013-02-02: qty 1

## 2013-02-02 MED ORDER — DEXAMETHASONE SODIUM PHOSPHATE 10 MG/ML IJ SOLN
INTRAMUSCULAR | Status: AC
  Administered 2013-02-02: 10 mg
  Filled 2013-02-02: qty 1

## 2013-02-02 MED ORDER — DEXAMETHASONE SODIUM PHOSPHATE 4 MG/ML IJ SOLN: 10.0000 mg | Freq: Once | INTRAMUSCULAR | Status: DC

## 2013-02-02 NOTE — ED Notes (Signed)
T Triplett, PA-C in to speak with patient.

## 2013-02-02 NOTE — ED Notes (Signed)
Hx of chronic back pain,  Taking injections by Dr Ethelene Hal for this .  Now having worse pain.

## 2013-02-02 NOTE — ED Notes (Signed)
Discharge instructions given and reviewed with patient.  Patient verbalized understanding to follow up with orthopedics as needed.  Patient ambulatory; discharged home in good condition. 

## 2013-02-02 NOTE — ED Provider Notes (Signed)
History    CSN: [id number] Arrival date & time 02/02/13  1104  First MD Initiated Contact with Patient 02/02/13 1116     Chief Complaint  Patient presents with  . Back Pain   (Consider location/radiation/quality/duration/timing/severity/associated sxs/prior Treatment) Patient is a 52 y.o. female presenting with back pain. The history is provided by the patient.  Back Pain Location:  Lumbar spine Quality:  Aching and shooting Radiates to:  R posterior upper leg, R knee and R thigh Pain severity:  Moderate Pain is:  Same all the time Onset quality:  Gradual Timing:  Constant Progression:  Worsening Chronicity:  Chronic Context: not falling, not lifting heavy objects, not recent injury and not twisting   Relieved by:  Bed rest (certain positions) Worsened by:  Bending, ambulation and twisting Ineffective treatments:  Lying down, NSAIDs and OTC medications Associated symptoms: leg pain   Associated symptoms: no abdominal pain, no abdominal swelling, no bladder incontinence, no bowel incontinence, no dysuria, no fever, no headaches, no numbness, no paresthesias, no pelvic pain, no perianal numbness, no tingling and no weakness    Past Medical History  Diagnosis Date  . Diabetes mellitus   . Depression   . Constipation   . Fibromyalgia   . Anemia    Past Surgical History  Procedure Laterality Date  . Knee surgery    . Cystectomy    . Shoulder surgery      right  . Endometrial ablation    . Colonoscopy  02/26/2012    SLF: Internal hemorrhoids/TCS IN 10 YEARS WITH PROPOFOL   Family History  Problem Relation Age of Onset  . Hypertension Mother   . Cancer Mother   . Asthma Sister   . Asthma Brother   . Stomach cancer      distant relative on dad's side of family  . Colon cancer Neg Hx    History  Substance Use Topics  . Smoking status: Former Smoker -- 0.50 packs/day for 15 years    Types: Cigarettes    Quit date: 04/29/1991  . Smokeless tobacco: Never Used  .  Alcohol Use: 0.6 oz/week    1 Glasses of wine per week   OB History   Grav Para Term Preterm Abortions TAB SAB Ect Mult Living   5 4 4  1  1   4      Review of Systems  Constitutional: Negative for fever.  Respiratory: Negative for shortness of breath.   Gastrointestinal: Negative for vomiting, abdominal pain, constipation and bowel incontinence.  Genitourinary: Negative for bladder incontinence, dysuria, hematuria, flank pain, decreased urine volume, difficulty urinating and pelvic pain.       No perineal numbness or incontinence of urine or feces  Musculoskeletal: Positive for back pain. Negative for joint swelling.  Skin: Negative for rash.  Neurological: Negative for tingling, weakness, numbness, headaches and paresthesias.  All other systems reviewed and are negative.    Allergies  Review of patient's allergies indicates no known allergies.  Home Medications   Current Outpatient Rx  Name  Route  Sig  Dispense  Refill  . acetaminophen (TYLENOL ARTHRITIS PAIN) 650 MG CR tablet   Oral   Take 650 mg by mouth every 8 (eight) hours as needed. For pain         . aspirin EC 81 MG tablet   Oral   Take 81 mg by mouth daily.         . diazepam (VALIUM) 10 MG tablet   Oral  Take 10 mg by mouth daily as needed. For sleep or anxiety         . fexofenadine (ALLEGRA) 180 MG tablet   Oral   Take 180 mg by mouth daily.         Marland Kitchen gabapentin (NEURONTIN) 300 MG capsule   Oral   Take 300 mg by mouth 3 (three) times daily.          . hydrochlorothiazide (HYDRODIURIL) 25 MG tablet   Oral   Take 25 mg by mouth daily.          Marland Kitchen losartan (COZAAR) 50 MG tablet   Oral   Take 50 mg by mouth daily.          . meloxicam (MOBIC) 15 MG tablet   Oral   Take 15 mg by mouth daily.          . methocarbamol (ROBAXIN) 500 MG tablet   Oral   Take 1,000 mg by mouth 3 (three) times daily. Muscle spams         . EXPIRED: potassium chloride SA (K-DUR,KLOR-CON) 20 MEQ  tablet   Oral   Take 1 tablet (20 mEq total) by mouth 2 (two) times daily.   4 tablet   0   . silver sulfADIAZINE (SILVADENE) 1 % cream   Topical   Apply topically daily.   50 g   0   . traZODone (DESYREL) 100 MG tablet   Oral   Take 100 mg by mouth at bedtime.         . vitamin B-12 (CYANOCOBALAMIN) 1000 MCG tablet   Oral   Take 1,000 mcg by mouth daily.          BP 150/93  Pulse 71  Temp(Src) 98.2 F (36.8 C) (Oral)  Resp 18  Ht 5\' 5"  (1.651 m)  Wt 196 lb (88.905 kg)  BMI 32.62 kg/m2  SpO2 100% Physical Exam  Nursing note and vitals reviewed. Constitutional: She is oriented to person, place, and time. She appears well-developed and well-nourished. No distress.  HENT:  Head: Normocephalic and atraumatic.  Neck: Normal range of motion. Neck supple.  Cardiovascular: Normal rate, regular rhythm, normal heart sounds and intact distal pulses.   No murmur heard. Pulmonary/Chest: Effort normal and breath sounds normal. No respiratory distress.  Musculoskeletal: She exhibits tenderness. She exhibits no edema.       Lumbar back: She exhibits tenderness and pain. She exhibits normal range of motion, no swelling, no deformity, no laceration and normal pulse.  ttp of the right  Lumbar spine and  paraspinal muscles.   DP pulses are brisk and symmetrical.  Distal sensation intact.  Hip Flexors/Extensors are intact  Neurological: She is alert and oriented to person, place, and time. No cranial nerve deficit or sensory deficit. She exhibits normal muscle tone. Coordination and gait normal.  Reflex Scores:      Patellar reflexes are 2+ on the right side and 2+ on the left side.      Achilles reflexes are 2+ on the right side and 2+ on the left side. Skin: Skin is warm and dry.    ED Course  Procedures (including critical care time) Labs Reviewed - No data to display     MDM  Patient reviewed on the  narcotics database.  Had prescription for 10 mg diazepam #30 and  oxycodone 10 mg #120 both on 01/16/13  No focal neuro deficits on exam.  Ambulates with a steady gait.  Patient  appears uncomfortable but non-toxic.  Likely lumbar radiculopathy, doubt emergent neuro or infectious process.  Pain improved after decadron injection and po flexeril.  Pt agrees to close f/u with Dr. Ethelene Hal.  Pt agrees to continue her oxycodone, no additional narcotics prescribed or administered.   Travante Knee L. Trisha Mangle, PA-C 02/04/13 2146

## 2013-02-07 NOTE — ED Provider Notes (Signed)
Medical screening examination/treatment/procedure(s) were performed by non-physician practitioner and as supervising physician I was immediately available for consultation/collaboration.   Shelda Jakes, MD 02/07/13 [phone]

## 2013-02-21 ENCOUNTER — Encounter (HOSPITAL_COMMUNITY): Payer: Self-pay

## 2013-02-21 ENCOUNTER — Emergency Department (HOSPITAL_COMMUNITY)
Admission: EM | Admit: 2013-02-21 | Discharge: 2013-02-21 | Disposition: A | Discharge status: Home / Self Care | Payer: Medicaid Other | Attending: Emergency Medicine | Admitting: Emergency Medicine

## 2013-02-21 DIAGNOSIS — D649 Anemia, unspecified: Secondary | ICD-10-CM | POA: Insufficient documentation

## 2013-02-21 DIAGNOSIS — E119 Type 2 diabetes mellitus without complications: Secondary | ICD-10-CM | POA: Insufficient documentation

## 2013-02-21 DIAGNOSIS — Z87891 Personal history of nicotine dependence: Secondary | ICD-10-CM | POA: Insufficient documentation

## 2013-02-21 DIAGNOSIS — M51379 Other intervertebral disc degeneration, lumbosacral region without mention of lumbar back pain or lower extremity pain: Secondary | ICD-10-CM | POA: Insufficient documentation

## 2013-02-21 DIAGNOSIS — F329 Major depressive disorder, single episode, unspecified: Secondary | ICD-10-CM | POA: Insufficient documentation

## 2013-02-21 DIAGNOSIS — Z8739 Personal history of other diseases of the musculoskeletal system and connective tissue: Secondary | ICD-10-CM | POA: Insufficient documentation

## 2013-02-21 DIAGNOSIS — M545 Low back pain, unspecified: Secondary | ICD-10-CM | POA: Insufficient documentation

## 2013-02-21 DIAGNOSIS — F3289 Other specified depressive episodes: Secondary | ICD-10-CM | POA: Insufficient documentation

## 2013-02-21 DIAGNOSIS — Z7982 Long term (current) use of aspirin: Secondary | ICD-10-CM | POA: Insufficient documentation

## 2013-02-21 DIAGNOSIS — Z794 Long term (current) use of insulin: Secondary | ICD-10-CM | POA: Insufficient documentation

## 2013-02-21 DIAGNOSIS — M5136 Other intervertebral disc degeneration, lumbar region: Secondary | ICD-10-CM

## 2013-02-21 DIAGNOSIS — M549 Dorsalgia, unspecified: Secondary | ICD-10-CM

## 2013-02-21 DIAGNOSIS — M5137 Other intervertebral disc degeneration, lumbosacral region: Secondary | ICD-10-CM | POA: Insufficient documentation

## 2013-02-21 DIAGNOSIS — F411 Generalized anxiety disorder: Secondary | ICD-10-CM | POA: Insufficient documentation

## 2013-02-21 DIAGNOSIS — Z79899 Other long term (current) drug therapy: Secondary | ICD-10-CM | POA: Insufficient documentation

## 2013-02-21 DIAGNOSIS — Z8719 Personal history of other diseases of the digestive system: Secondary | ICD-10-CM | POA: Insufficient documentation

## 2013-02-21 DIAGNOSIS — Z9889 Other specified postprocedural states: Secondary | ICD-10-CM | POA: Insufficient documentation

## 2013-02-21 MED ORDER — METHOCARBAMOL 500 MG PO TABS: ORAL_TABLET | ORAL | Status: DC

## 2013-02-21 MED ORDER — METHOCARBAMOL 500 MG PO TABS
1000.0000 mg | ORAL_TABLET | Freq: Once | ORAL | Status: AC
  Administered 2013-02-21: 1000 mg via ORAL
  Filled 2013-02-21: qty 2

## 2013-02-21 MED ORDER — DEXAMETHASONE SODIUM PHOSPHATE 4 MG/ML IJ SOLN
8.0000 mg | Freq: Once | INTRAMUSCULAR | Status: AC
  Administered 2013-02-21: 8 mg via INTRAMUSCULAR
  Filled 2013-02-21: qty 2

## 2013-02-21 MED ORDER — HYDROMORPHONE HCL PF 1 MG/ML IJ SOLN
2.0000 mg | Freq: Once | INTRAMUSCULAR | Status: AC
  Administered 2013-02-21: 2 mg via INTRAMUSCULAR
  Filled 2013-02-21: qty 2

## 2013-02-21 MED ORDER — ONDANSETRON HCL 4 MG PO TABS
4.0000 mg | ORAL_TABLET | Freq: Once | ORAL | Status: AC
  Administered 2013-02-21: 4 mg via ORAL
  Filled 2013-02-21: qty 1

## 2013-02-21 NOTE — ED Provider Notes (Signed)
Medical screening examination/treatment/procedure(s) were performed by non-physician practitioner and as supervising physician I was immediately available for consultation/collaboration.   Hurman Horn, MD 02/21/13 [phone]

## 2013-02-21 NOTE — ED Notes (Signed)
Pt states she had her right hip injected by Dr. Ethelene Hal on Wednesday. States when he was doing the procedure she told him it felt like he hit a nerve. States she has been having severe pain and has not been able to walk since without crutches.

## 2013-02-21 NOTE — ED Provider Notes (Signed)
CSN: [id number]     Arrival date & time 02/21/13  4098 History     First MD Initiated Contact with Patient 02/21/13 0848     Chief Complaint  Patient presents with  . Hip Pain   (Consider location/radiation/quality/duration/timing/severity/associated sxs/prior Treatment) Patient is a 52 y.o. female presenting with hip pain. The history is provided by the patient.  Hip Pain This is a chronic problem. The current episode started more than 1 year ago. The problem occurs daily. The problem has been gradually worsening. Associated symptoms include arthralgias and myalgias. Pertinent negatives include no abdominal pain, change in bowel habit, chest pain, coughing, neck pain or numbness. The symptoms are aggravated by standing, walking and bending. She has tried lying down and oral narcotics for the symptoms. The treatment provided no relief.    Past Medical History  Diagnosis Date  . Diabetes mellitus   . Depression   . Constipation   . Fibromyalgia   . Anemia    Past Surgical History  Procedure Laterality Date  . Knee surgery    . Cystectomy    . Shoulder surgery      right  . Endometrial ablation    . Colonoscopy  02/26/2012    SLF: Internal hemorrhoids/TCS IN 10 YEARS WITH PROPOFOL   Family History  Problem Relation Age of Onset  . Hypertension Mother   . Cancer Mother   . Asthma Sister   . Asthma Brother   . Stomach cancer      distant relative on dad's side of family  . Colon cancer Neg Hx    History  Substance Use Topics  . Smoking status: Former Smoker -- 0.50 packs/day for 15 years    Types: Cigarettes    Quit date: 04/29/1991  . Smokeless tobacco: Never Used  . Alcohol Use: 0.6 oz/week    1 Glasses of wine per week   OB History   Grav Para Term Preterm Abortions TAB SAB Ect Mult Living   5 4 4  1  1   4      Review of Systems  Constitutional: Negative for activity change.       All ROS Neg except as noted in HPI  HENT: Negative for nosebleeds and neck  pain.   Eyes: Negative for photophobia and discharge.  Respiratory: Negative for cough, shortness of breath and wheezing.   Cardiovascular: Negative for chest pain and palpitations.  Gastrointestinal: Negative for abdominal pain, blood in stool and change in bowel habit.  Genitourinary: Negative for dysuria, frequency and hematuria.  Musculoskeletal: Positive for myalgias and arthralgias. Negative for back pain.  Skin: Negative.   Neurological: Negative for dizziness, seizures, speech difficulty and numbness.  Psychiatric/Behavioral: Negative for hallucinations and confusion.    Allergies  Review of patient's allergies indicates no known allergies.  Home Medications   Current Outpatient Rx  Name  Route  Sig  Dispense  Refill  . acetaminophen (TYLENOL ARTHRITIS PAIN) 650 MG CR tablet   Oral   Take 650 mg by mouth every 8 (eight) hours as needed. For pain         . aspirin EC 81 MG tablet   Oral   Take 81 mg by mouth daily.         Marland Kitchen atorvastatin (LIPITOR) 40 MG tablet   Oral   Take 40 mg by mouth daily.         . citalopram (CELEXA) 20 MG tablet   Oral   Take 20 mg  by mouth daily.         . diazepam (VALIUM) 10 MG tablet   Oral   Take 10 mg by mouth daily as needed. For sleep or anxiety         . fexofenadine (ALLEGRA) 180 MG tablet   Oral   Take 180 mg by mouth daily.         Marland Kitchen gabapentin (NEURONTIN) 300 MG capsule   Oral   Take 300 mg by mouth 3 (three) times daily.          . hydrochlorothiazide (HYDRODIURIL) 25 MG tablet   Oral   Take 25 mg by mouth daily.          . insulin glargine (LANTUS) 100 UNIT/ML injection   Subcutaneous   Inject 20-40 Units into the skin at bedtime. <200 do not take.  If >200 take 20-40 units. Pt does not have the sliding scale info         . insulin lispro protamine-lispro (HUMALOG 75/25) (75-25) 100 UNIT/ML SUSP   Subcutaneous   Inject 10-40 Units into the skin 3 (three) times daily. >200=10 or more units upto  40 units. Pt does not have the sliding scale         . losartan (COZAAR) 50 MG tablet   Oral   Take 50 mg by mouth daily.          . meloxicam (MOBIC) 15 MG tablet   Oral   Take 15 mg by mouth daily.          . methocarbamol (ROBAXIN) 500 MG tablet   Oral   Take 1,000 mg by mouth 3 (three) times daily. Muscle spams         . oxyCODONE-acetaminophen (PERCOCET) 10-325 MG per tablet   Oral   Take 1 tablet by mouth every 4 (four) hours as needed for pain.         Marland Kitchen EXPIRED: potassium chloride SA (K-DUR,KLOR-CON) 20 MEQ tablet   Oral   Take 20 mEq by mouth 2 (two) times daily.         . silver sulfADIAZINE (SILVADENE) 1 % cream   Topical   Apply 1 application topically daily.         . traZODone (DESYREL) 100 MG tablet   Oral   Take 50-100 mg by mouth at bedtime as needed for sleep.          . vitamin B-12 (CYANOCOBALAMIN) 1000 MCG tablet   Oral   Take 1,000 mcg by mouth daily.          BP 126/76  Pulse 58  Temp(Src) 98.7 F (37.1 C) (Oral)  Resp 18  Ht 5' 5.5" (1.664 m)  Wt 203 lb (92.08 kg)  BMI 33.26 kg/m2  SpO2 99% Physical Exam  Nursing note and vitals reviewed. Constitutional: She is oriented to person, place, and time. She appears well-developed and well-nourished.  Non-toxic appearance.  HENT:  Head: Normocephalic.  Right Ear: Tympanic membrane and external ear normal.  Left Ear: Tympanic membrane and external ear normal.  Eyes: EOM and lids are normal. Pupils are equal, round, and reactive to light.  Neck: Normal range of motion. Neck supple. Carotid bruit is not present.  Cardiovascular: Normal rate, regular rhythm, normal heart sounds, intact distal pulses and normal pulses.   Pulmonary/Chest: Breath sounds normal. No respiratory distress.  Abdominal: Soft. Bowel sounds are normal. There is no tenderness. There is no guarding.  Musculoskeletal: She exhibits tenderness.  Lumbar back: She exhibits decreased range of motion,  tenderness, pain and spasm.  Lymphadenopathy:       Head (right side): No submandibular adenopathy present.       Head (left side): No submandibular adenopathy present.    She has no cervical adenopathy.  Neurological: She is alert and oriented to person, place, and time. She has normal strength. No cranial nerve deficit or sensory deficit.  Skin: Skin is warm and dry.  Psychiatric: Her speech is normal. Her mood appears anxious.    ED Course   Procedures (including critical care time)  Labs Reviewed - No data to display No results found. No diagnosis found.  MDM   Pt has a hx of DDD involving L5S1. Receiving injections for pain in the lumbar area, but these are becoming less and less effective. Today pt had pain that would not respond to percocet. No loss of bowel or bladder function. No gross neuro deficit on exam. Plan - Pt given dilaudid and robaxin and decadron in ED. Pt has mobic and percocet at home. Will add robaxin for spasm. Pt to follow up with Dr Ethelene Hal on Monday , Aug. 4.I have reviewed nursing notes, vital signs, and all appropriate lab and imaging results for this patient. Pt's pain has improved. Ambulated pt in room and hall. She is back at baseline. Will discharge home.  Kathie Dike, PA-C 02/21/13 1002

## 2013-02-24 ENCOUNTER — Emergency Department (HOSPITAL_COMMUNITY)
Admission: EM | Admit: 2013-02-24 | Discharge: 2013-02-24 | Disposition: A | Discharge status: Home / Self Care | Payer: Medicaid Other | Attending: Emergency Medicine | Admitting: Emergency Medicine

## 2013-02-24 ENCOUNTER — Encounter (HOSPITAL_COMMUNITY): Payer: Self-pay | Admitting: *Deleted

## 2013-02-24 DIAGNOSIS — E119 Type 2 diabetes mellitus without complications: Secondary | ICD-10-CM | POA: Insufficient documentation

## 2013-02-24 DIAGNOSIS — IMO0002 Reserved for concepts with insufficient information to code with codable children: Secondary | ICD-10-CM | POA: Insufficient documentation

## 2013-02-24 DIAGNOSIS — Z862 Personal history of diseases of the blood and blood-forming organs and certain disorders involving the immune mechanism: Secondary | ICD-10-CM | POA: Insufficient documentation

## 2013-02-24 DIAGNOSIS — G8929 Other chronic pain: Secondary | ICD-10-CM | POA: Insufficient documentation

## 2013-02-24 DIAGNOSIS — Z8719 Personal history of other diseases of the digestive system: Secondary | ICD-10-CM | POA: Insufficient documentation

## 2013-02-24 DIAGNOSIS — Z7982 Long term (current) use of aspirin: Secondary | ICD-10-CM | POA: Insufficient documentation

## 2013-02-24 DIAGNOSIS — Z79899 Other long term (current) drug therapy: Secondary | ICD-10-CM | POA: Insufficient documentation

## 2013-02-24 DIAGNOSIS — IMO0001 Reserved for inherently not codable concepts without codable children: Secondary | ICD-10-CM | POA: Insufficient documentation

## 2013-02-24 DIAGNOSIS — F329 Major depressive disorder, single episode, unspecified: Secondary | ICD-10-CM | POA: Insufficient documentation

## 2013-02-24 DIAGNOSIS — Z87891 Personal history of nicotine dependence: Secondary | ICD-10-CM | POA: Insufficient documentation

## 2013-02-24 DIAGNOSIS — M5416 Radiculopathy, lumbar region: Secondary | ICD-10-CM

## 2013-02-24 DIAGNOSIS — F3289 Other specified depressive episodes: Secondary | ICD-10-CM | POA: Insufficient documentation

## 2013-02-24 DIAGNOSIS — Z794 Long term (current) use of insulin: Secondary | ICD-10-CM | POA: Insufficient documentation

## 2013-02-24 MED ORDER — ONDANSETRON 8 MG PO TBDP
8.0000 mg | ORAL_TABLET | Freq: Once | ORAL | Status: AC
  Administered 2013-02-24: 8 mg via ORAL
  Filled 2013-02-24: qty 1

## 2013-02-24 MED ORDER — HYDROMORPHONE HCL PF 2 MG/ML IJ SOLN
2.0000 mg | Freq: Once | INTRAMUSCULAR | Status: AC
  Administered 2013-02-24: 2 mg via INTRAMUSCULAR
  Filled 2013-02-24: qty 1

## 2013-02-24 NOTE — ED Notes (Signed)
Patient given discharge instruction, verbalized understand. Patient in wheelchair out of the department with family.

## 2013-02-24 NOTE — ED Notes (Signed)
Pt had burn from heat pad on left hip. Pt state she bought a new automatic turn off heating pad.

## 2013-02-24 NOTE — ED Notes (Signed)
Pt laying on side, with pillow between knees, pt has appointment with specialist, could not wait, pt has gotten worse since back injection, felt like they hit a nerve. Pt in back down right leg and into shin area. Pt had been using heat pad.

## 2013-02-24 NOTE — ED Notes (Signed)
States she has pain in right hip radiating into right leg, states pain has left her unable to take care of herself, states she has burned her left buttock with a heating pad

## 2013-02-24 NOTE — ED Provider Notes (Signed)
CSN: [id number]     Arrival date & time 02/24/13  1952 History  This chart was scribed for Flint Melter, MD by Bennett Scrape, ED Scribe. This patient was seen in room APA07/AP[facility]07 and the patient's care was started at 10:48 PM.   Chief Complaint  Patient presents with  . Back Pain    Patient is a 52 y.o. female presenting with back pain. The history is provided by the patient. No language interpreter was used.  Back Pain Location:  Lumbar spine Quality:  Cramping and shooting Radiates to:  R posterior upper leg Timing:  Constant Progression:  Worsening Chronicity:  Chronic Associated symptoms: no bladder incontinence and no bowel incontinence     HPI Comments: [name] is a 52 y.o. female who presents to the Emergency Department complaining of chronic lower right back pain that radiates into the right hip and down the posterior right leg described as a cramp with occasional sharp, shooting pains. She reports that she is being treated for a pinched nerve and states that she received an injection one week ago with worsening of symptoms. She is now set to follow up with Dr. Shon Baton, back specialist. She has been taking percocet and robaxin with mild improvement. She has also been trying a heating pad for the symptoms and burnt her left hip. She denies any other symptoms.  Past Medical History  Diagnosis Date  . Diabetes mellitus   . Depression   . Constipation   . Fibromyalgia   . Anemia    Past Surgical History  Procedure Laterality Date  . Knee surgery    . Cystectomy    . Shoulder surgery      right  . Endometrial ablation    . Colonoscopy  02/26/2012    SLF: Internal hemorrhoids/TCS IN 10 YEARS WITH PROPOFOL   Family History  Problem Relation Age of Onset  . Hypertension Mother   . Cancer Mother   . Asthma Sister   . Asthma Brother   . Stomach cancer      distant relative on dad's side of family  . Colon cancer Neg Hx    History  Substance Use Topics   . Smoking status: Former Smoker -- 0.50 packs/day for 15 years    Types: Cigarettes    Quit date: 04/29/1991  . Smokeless tobacco: Never Used  . Alcohol Use: 0.6 oz/week    1 Glasses of wine per week   OB History   Grav Para Term Preterm Abortions TAB SAB Ect Mult Living   5 4 4  1  1   4      Review of Systems  Gastrointestinal: Negative for bowel incontinence.  Genitourinary: Negative for bladder incontinence.  Musculoskeletal: Positive for back pain.  All other systems reviewed and are negative.    Allergies  Review of patient's allergies indicates no known allergies.  Home Medications   Current Outpatient Rx  Name  Route  Sig  Dispense  Refill  . acetaminophen (TYLENOL ARTHRITIS PAIN) 650 MG CR tablet   Oral   Take 650 mg by mouth every 8 (eight) hours as needed. For pain         . aspirin EC 81 MG tablet   Oral   Take 81 mg by mouth daily.         Marland Kitchen atorvastatin (LIPITOR) 40 MG tablet   Oral   Take 40 mg by mouth daily.         . citalopram (  CELEXA) 20 MG tablet   Oral   Take 20 mg by mouth daily.         . diazepam (VALIUM) 10 MG tablet   Oral   Take 10 mg by mouth daily as needed. For sleep or anxiety         . fexofenadine (ALLEGRA) 180 MG tablet   Oral   Take 180 mg by mouth daily.         . fluticasone (FLONASE) 50 MCG/ACT nasal spray   Nasal   Place 1 spray into the nose daily.         Marland Kitchen gabapentin (NEURONTIN) 300 MG capsule   Oral   Take 300 mg by mouth 3 (three) times daily.          . hydrochlorothiazide (HYDRODIURIL) 25 MG tablet   Oral   Take 25 mg by mouth daily.          . insulin glargine (LANTUS) 100 UNIT/ML injection   Subcutaneous   Inject 20-40 Units into the skin at bedtime. <200 do not take.  If >200 take 20-40 units. Pt does not have the sliding scale info         . insulin lispro protamine-lispro (HUMALOG 75/25) (75-25) 100 UNIT/ML SUSP   Subcutaneous   Inject 10-40 Units into the skin 3 (three) times  daily. >200=10 or more units upto 40 units. Pt does not have the sliding scale         . losartan (COZAAR) 50 MG tablet   Oral   Take 50 mg by mouth daily.          . meloxicam (MOBIC) 15 MG tablet   Oral   Take 15 mg by mouth daily.          . methocarbamol (ROBAXIN) 500 MG tablet   Oral   Take 500 mg by mouth 3 (three) times daily. Muscle spams         . oxyCODONE-acetaminophen (PERCOCET) 10-325 MG per tablet   Oral   Take 1 tablet by mouth every 4 (four) hours as needed for pain.         . potassium chloride SA (K-DUR,KLOR-CON) 20 MEQ tablet   Oral   Take 20 mEq by mouth 2 (two) times daily.         . silver sulfADIAZINE (SILVADENE) 1 % cream   Topical   Apply 1 application topically daily.         . traZODone (DESYREL) 100 MG tablet   Oral   Take 50-100 mg by mouth at bedtime as needed for sleep.          . vitamin B-12 (CYANOCOBALAMIN) 1000 MCG tablet   Oral   Take 1,000 mcg by mouth daily.           Physical Exam  Nursing note and vitals reviewed. Constitutional: She is oriented to person, place, and time. She appears well-developed and well-nourished.  HENT:  Head: Normocephalic and atraumatic.  Right Ear: External ear normal.  Left Ear: External ear normal.  Eyes: Conjunctivae and EOM are normal. Pupils are equal, round, and reactive to light.  Neck: Normal range of motion and phonation normal. Neck supple.  Cardiovascular: Normal rate, regular rhythm, normal heart sounds and intact distal pulses.   Pulmonary/Chest: Effort normal and breath sounds normal. She exhibits no bony tenderness.  Abdominal: Soft. Normal appearance. There is no tenderness.  Musculoskeletal: Normal range of motion.  Neurological: She is alert and oriented  to person, place, and time. She has normal strength. No cranial nerve deficit or sensory deficit. She exhibits normal muscle tone. Coordination normal.  Skin: Skin is warm, dry and intact.  Superficial burn over the  left pelvis   Psychiatric: She has a normal mood and affect. Her behavior is normal. Judgment and thought content normal.    ED Course   Procedures (including critical care time)  Medications  HYDROmorphone (DILAUDID) injection 2 mg (2 mg Intramuscular Given 02/24/13 2259)  ondansetron (ZOFRAN-ODT) disintegrating tablet 8 mg (8 mg Oral Given 02/24/13 2300)    Patient Vitals for the past 24 hrs:  BP Temp Pulse Resp SpO2 Height Weight  02/24/13 2325 121/62 mmHg - 51 20 98 % - -  02/24/13 2007 132/94 mmHg 97.9 F (36.6 C) 70 20 100 % 5' 0.5" (1.537 m) 200 lb (90.719 kg)   DIAGNOSTIC STUDIES: Oxygen Saturation is 100% on room air, normal by my interpretation.    COORDINATION OF CARE: 10:52 PM-Discussed treatment plan which includes pain medications with pt at bedside and pt agreed to plan.     1. Lumbar radiculopathy, chronic     MDM  Ongoing chronic lumbar radiculopathy. Currently, under treatment, and being referred to a specialist for possible surgical intervention. Doubt cauda equina syndrome, fracture, discitis, or bony tumor.  Nursing Notes Reviewed/ Care Coordinated, and agree without changes. Applicable Imaging Reviewed.  Interpretation of Laboratory Data incorporated into ED treatment   Plan: Home Medications- usual; Home Treatments and Observation- rest; return here if the recommended treatment, does not improve the symptoms; Recommended follow up- orthopedic follow up as soon as possible    I personally performed the services described in this documentation, which was scribed in my presence. The recorded information has been reviewed and is accurate.      Flint Melter, MD 02/25/13 [phone]

## 2013-02-28 ENCOUNTER — Emergency Department (HOSPITAL_COMMUNITY)
Admission: EM | Admit: 2013-02-28 | Discharge: 2013-02-28 | Disposition: A | Discharge status: Home / Self Care | Payer: Medicaid Other | Attending: Emergency Medicine | Admitting: Emergency Medicine

## 2013-02-28 ENCOUNTER — Encounter (HOSPITAL_COMMUNITY): Payer: Self-pay | Admitting: *Deleted

## 2013-02-28 DIAGNOSIS — IMO0001 Reserved for inherently not codable concepts without codable children: Secondary | ICD-10-CM | POA: Insufficient documentation

## 2013-02-28 DIAGNOSIS — Z9889 Other specified postprocedural states: Secondary | ICD-10-CM | POA: Insufficient documentation

## 2013-02-28 DIAGNOSIS — Z791 Long term (current) use of non-steroidal anti-inflammatories (NSAID): Secondary | ICD-10-CM | POA: Insufficient documentation

## 2013-02-28 DIAGNOSIS — M79609 Pain in unspecified limb: Secondary | ICD-10-CM | POA: Insufficient documentation

## 2013-02-28 DIAGNOSIS — E119 Type 2 diabetes mellitus without complications: Secondary | ICD-10-CM | POA: Insufficient documentation

## 2013-02-28 DIAGNOSIS — IMO0002 Reserved for concepts with insufficient information to code with codable children: Secondary | ICD-10-CM | POA: Insufficient documentation

## 2013-02-28 DIAGNOSIS — M79604 Pain in right leg: Secondary | ICD-10-CM

## 2013-02-28 DIAGNOSIS — F329 Major depressive disorder, single episode, unspecified: Secondary | ICD-10-CM | POA: Insufficient documentation

## 2013-02-28 DIAGNOSIS — Z7982 Long term (current) use of aspirin: Secondary | ICD-10-CM | POA: Insufficient documentation

## 2013-02-28 DIAGNOSIS — Z794 Long term (current) use of insulin: Secondary | ICD-10-CM | POA: Insufficient documentation

## 2013-02-28 DIAGNOSIS — F3289 Other specified depressive episodes: Secondary | ICD-10-CM | POA: Insufficient documentation

## 2013-02-28 DIAGNOSIS — Z79899 Other long term (current) drug therapy: Secondary | ICD-10-CM | POA: Insufficient documentation

## 2013-02-28 DIAGNOSIS — Z8739 Personal history of other diseases of the musculoskeletal system and connective tissue: Secondary | ICD-10-CM | POA: Insufficient documentation

## 2013-02-28 DIAGNOSIS — D649 Anemia, unspecified: Secondary | ICD-10-CM | POA: Insufficient documentation

## 2013-02-28 DIAGNOSIS — Z87891 Personal history of nicotine dependence: Secondary | ICD-10-CM | POA: Insufficient documentation

## 2013-02-28 MED ORDER — HYDROCODONE-ACETAMINOPHEN 5-325 MG PO TABS
2.0000 | ORAL_TABLET | Freq: Once | ORAL | Status: AC
  Administered 2013-02-28: 2 via ORAL
  Filled 2013-02-28: qty 2

## 2013-02-28 MED ORDER — HYDROCODONE-ACETAMINOPHEN 5-325 MG PO TABS: 1.0000 | ORAL_TABLET | Freq: Four times a day (QID) | ORAL | Status: DC | PRN

## 2013-02-28 NOTE — ED Notes (Signed)
MD at bedside. 

## 2013-02-28 NOTE — ED Notes (Signed)
Pt has right lower leg pain,  She has been seen here several visits for pain of back,  Pt states she needs an MRI or something but she can't wait until September 19 to be seen by her doctor,

## 2013-02-28 NOTE — ED Provider Notes (Addendum)
CSN: [id number]     Arrival date & time 02/28/13  2132 History     First MD Initiated Contact with Patient 02/28/13 2149     Chief Complaint  Patient presents with  . Leg Pain   (Consider location/radiation/quality/duration/timing/severity/associated sxs/prior Treatment) HPI Comments: Pt comes in with cc of leg pain. Pt states that she started having leg pain, right side x 1 week, throbbing. The pain is in the calf area only. There is no hx of DVT, PE, rash, trauma. Pt has hx of radiculopathy, but this current pain is different.    Patient is a 52 y.o. female presenting with leg pain. The history is provided by the patient.  Leg Pain   Past Medical History  Diagnosis Date  . Diabetes mellitus   . Depression   . Constipation   . Fibromyalgia   . Anemia    Past Surgical History  Procedure Laterality Date  . Knee surgery    . Cystectomy    . Shoulder surgery      right  . Endometrial ablation    . Colonoscopy  02/26/2012    SLF: Internal hemorrhoids/TCS IN 10 YEARS WITH PROPOFOL   Family History  Problem Relation Age of Onset  . Hypertension Mother   . Cancer Mother   . Asthma Sister   . Asthma Brother   . Stomach cancer      distant relative on dad's side of family  . Colon cancer Neg Hx    History  Substance Use Topics  . Smoking status: Former Smoker -- 0.50 packs/day for 15 years    Types: Cigarettes    Quit date: 04/29/1991  . Smokeless tobacco: Never Used  . Alcohol Use: 0.6 oz/week    1 Glasses of wine per week   OB History   Grav Para Term Preterm Abortions TAB SAB Ect Mult Living   5 4 4  1  1   4      Review of Systems  Constitutional: Positive for activity change.  Gastrointestinal: Negative for nausea and vomiting.  Genitourinary: Negative for dysuria.  Musculoskeletal: Positive for myalgias.  Skin: Negative for rash.  Neurological: Negative for headaches.    Allergies  Review of patient's allergies indicates no known allergies.  Home  Medications   Current Outpatient Rx  Name  Route  Sig  Dispense  Refill  . acetaminophen (TYLENOL ARTHRITIS PAIN) 650 MG CR tablet   Oral   Take 650 mg by mouth every 8 (eight) hours as needed for pain. For pain         . aspirin EC 81 MG tablet   Oral   Take 81 mg by mouth every morning.          Marland Kitchen atorvastatin (LIPITOR) 40 MG tablet   Oral   Take 40 mg by mouth every morning.          . citalopram (CELEXA) 20 MG tablet   Oral   Take 20 mg by mouth every morning.          . diazepam (VALIUM) 10 MG tablet   Oral   Take 10 mg by mouth daily as needed for anxiety or sleep. For sleep or anxiety         . fexofenadine (ALLEGRA) 180 MG tablet   Oral   Take 180 mg by mouth every morning.          . fluticasone (FLONASE) 50 MCG/ACT nasal spray   Nasal   Place  1 spray into the nose every morning.          . gabapentin (NEURONTIN) 300 MG capsule   Oral   Take 300 mg by mouth 3 (three) times daily.          . hydrochlorothiazide (HYDRODIURIL) 25 MG tablet   Oral   Take 25 mg by mouth every morning.          . insulin glargine (LANTUS) 100 UNIT/ML injection   Subcutaneous   Inject 20-40 Units into the skin at bedtime. For BS >200--sliding scale         . insulin lispro protamine-lispro (HUMALOG 75/25) (75-25) 100 UNIT/ML SUSP   Subcutaneous   Inject 10-40 Units into the skin 3 (three) times daily with meals. Sliding scale         . losartan (COZAAR) 50 MG tablet   Oral   Take 50 mg by mouth every morning.          . meloxicam (MOBIC) 15 MG tablet   Oral   Take 15 mg by mouth every morning.          . methocarbamol (ROBAXIN) 500 MG tablet   Oral   Take 500 mg by mouth 3 (three) times daily. Muscle spams         . oxyCODONE-acetaminophen (PERCOCET) 10-325 MG per tablet   Oral   Take 1 tablet by mouth every 4 (four) hours as needed for pain.         . potassium chloride SA (K-DUR,KLOR-CON) 20 MEQ tablet   Oral   Take 20 mEq by mouth 2  (two) times daily.         . silver sulfADIAZINE (SILVADENE) 1 % cream   Topical   Apply 1 application topically daily.         . traZODone (DESYREL) 100 MG tablet   Oral   Take 50-100 mg by mouth at bedtime as needed for sleep.          . vitamin B-12 (CYANOCOBALAMIN) 1000 MCG tablet   Oral   Take 1,000 mcg by mouth every morning.           BP 140/88  Pulse 78  Temp(Src) 99.1 F (37.3 C) (Oral)  Resp 18  Ht 5\' 5"  (1.651 m)  Wt 196 lb (88.905 kg)  BMI 32.62 kg/m2  SpO2 96% Physical Exam  Nursing note and vitals reviewed. Constitutional: She is oriented to person, place, and time. She appears well-developed and well-nourished.  HENT:  Head: Normocephalic and atraumatic.  Eyes: EOM are normal. Pupils are equal, round, and reactive to light.  Neck: Neck supple.  Cardiovascular: Normal rate, regular rhythm, normal heart sounds and intact distal pulses.   No murmur heard. Pulmonary/Chest: Effort normal. No respiratory distress.  Abdominal: Soft. She exhibits no distension. There is no tenderness. There is no rebound and no guarding.  Musculoskeletal:  Unilateral RLE calf tenderness, no pitting edema, no leg swelling.  Neurological: She is alert and oriented to person, place, and time.  Skin: Skin is warm and dry.    ED Course   Procedures (including critical care time)  Labs Reviewed  D-DIMER, QUANTITATIVE - Abnormal; Notable for the following:    D-Dimer, Quant 0.53 (*)    All other components within normal limits   No results found. No diagnosis found.  MDM  Pt comes in with cc of leg swelling. WELLS Score for DVT is 1. Dimer was ordered, and is > 0.48.  We will give her outpatient US duplex order.  There is no signs of infection, trauma, deep infections. Dont think this is neuropathy.  Neurovascularly intact  Derwood Kaplan, MD 02/28/13 9604  Derwood Kaplan, MD 02/28/13 2309

## 2013-03-02 ENCOUNTER — Ambulatory Visit (HOSPITAL_COMMUNITY)
Admission: RE | Admit: 2013-03-02 | Discharge: 2013-03-02 | Disposition: A | Discharge status: Home / Self Care | Payer: Medicaid Other | Source: Ambulatory Visit | Attending: Family Medicine | Admitting: Family Medicine

## 2013-03-02 DIAGNOSIS — M79609 Pain in unspecified limb: Secondary | ICD-10-CM

## 2013-03-02 DIAGNOSIS — R799 Abnormal finding of blood chemistry, unspecified: Secondary | ICD-10-CM | POA: Insufficient documentation

## 2013-03-02 NOTE — Progress Notes (Signed)
*  PRELIMINARY RESULTS* Vascular Ultrasound Right lower extremity venous duplex has been completed.  Preliminary findings: negative for DVT and baker's cyst.    Farrel Demark, RDMS, RVT  03/02/2013, 12:17 PM

## 2013-03-19 ENCOUNTER — Telehealth (HOSPITAL_COMMUNITY): Payer: Self-pay | Admitting: *Deleted

## 2013-05-05 NOTE — H&P (Signed)
History of Present Illness  The patient is a 52 year old female who presents today for follow up of their back. The patient is being followed for their central (and to the right and down the right leg to the foot) back pain and spondylolisthesis (L5-S1). Symptoms reported today include: pain, stiffness, difficulty ambulating, numbness (and tingling from the mid right tib/fib laterally to the foot for more than one month), leg pain, pain with sitting and pain with standing. The patient states that they are doing poorly. Current treatment includes: pain medications. The following medication has been used for pain control: Percocet (10-325mg  qid) and Meloxicam 15mg  and Neurontin 300mg . The patient reports their current pain level to be 10 / 10. The patient presents today following MRI. Note for "Follow-up back": Went to PCP who gave her a steroid injection. Made the pain worse and she went to the ER. She was given another injection and Flexeril.   Subjective Transcription  The patient returns today for follow up. Unfortunately, she continues to have severe back, buttock, and right radicular leg pain.   Allergies No Known Drug Allergies. 04/08/2012 No Known Allergies. 02/28/2011    Social History Drug/Alcohol Rehab (Previously). no Pain Contract. yes Marital status. divorced Drug/Alcohol Rehab (Currently). no Illicit drug use. no Exercise. Exercises weekly; does running / walking, Exercises rarely; does running / walking Number of flights of stairs before winded. less than 1 Pain Contract. yes Alcohol use. Occasional alcohol use, Drinks wine. current drinker; drinks wine; only occasionally per week Tobacco use. former smoker; smoke(d) 3/4 pack(s) per day Illicit drug use. no Living situation. live alone Tobacco / smoke exposure. yes outdoors only Marital status. divorced Tobacco use. Former smoker. quit smoking 21 years ago former smoker, former  smoker; smoke(d) 3/4 pack(s) per day Alcohol use. current drinker; drinks wine; only occasionally per week Number of flights of stairs before winded. less than 1 Tobacco / smoke exposure. yes outdoors only Living situation. live alone Children. 5 or more Exercise. Exercises rarely; does running / walking Current work status. working full time Children. 4, 5 or more Drug/Alcohol Rehab (Previously). no Drug/Alcohol Rehab (Currently). no Current work status. working full time    Medication History Mobic (15MG  Tablet, 1 Oral daily, Taken starting 09/11/2012) Active. Neurontin (300MG  Capsule, 1 Oral three times daily, Taken starting 09/11/2012) Active. Valium (10MG  Tablet, Oral) Active. (prn) Lisinopril-Hydrochlorothiazide (10-12.5MG  Tablet, Oral) Active. (qd) HumaLOG Mix 75/25 ((75-25) 100UNIT/ML Suspension, Subcutaneous) Active. (tid)    Objective Transcription  On clinical exam she has positive straight leg raise test. Horrific back pain with forward flexion and extension and palpation. She has no shortness of breath or chest pain. No incontinence of bowel and bladder. She is a pleasant woman, who appears younger than her stated age. She's alert. She's oriented times 3. No shortness of breath or chest pain. The abdomen is soft and nontender. No incontinence of bowel or bladder. She has no real hip, knee or ankle pain with joint ROM. There is numbness and dysesthesias in the L5-S1 distribution on the right side. She has positive Lhermitte's sign, negative SLR test, symmetrical 1+ DTRs, negative Babinski test, no clonus. Back pain is quite severe, especially with forward flexion of the back or rotation of the back, decreased pain when she extends the spine.    At this point in time she has returned status post obtaining the new MRI.    RADIOGRAPHS:  The MRI, which was done on 03/16/13 demonstrates grade I borderline II spondylolisthesis  at L5-S1 with  mass effect on the right L5 dorsal root ganglion. There is also severe facet cyst on the right side at L4/5  causing severe lateral recess stenosis. On the left there is also one, but it is moderate to severe.    Plans Transcription  At this point in time I do think that the principle source of pain is the nerve irritation L5 nerve root at both the L4-5 and the L5-S1 level. In order to address this, I still think, the TLIF to decompress and stabilize 5-1 given the slip is important, but also then doing the right side lateral decompression and foraminotomy to remove the cyst and further decompress that L5 nerve root as it is in the traversing pattern at the L4-5 level. I have explained to the patient the risks of surgery. The risks of that include infection, bleeding, nerve damage, death, stoke, paralysis, failure to heal, need for further surgery, ongoing or worse pain, leak of spinal fluid, failure to improve, loss of bowel and bladder control, blood clots, adjacent segment disease. The patient has expressed an understanding of the risks. At this point all of her questions were addressed. I did tell her that more than likely she would probably be out of work for about 12 weeks and then we can gradually increase her activities. In terms of her pain control after surgery, I am happy to take over the pain control, which I do for three months. If there is still significant pain requiring narcotics medications at the end of those three months, I will either refer her back to her primary care physician, or to a pain specialist.

## 2013-05-08 ENCOUNTER — Encounter (HOSPITAL_COMMUNITY): Payer: Self-pay | Admitting: Pharmacy Technician

## 2013-05-09 NOTE — Pre-Procedure Instructions (Signed)
[name]  05/09/2013   Your procedure is scheduled on:  October 23  Report to [facility] Entrance "A" [address] at [employer] AM.  Call this number if you have problems the morning of surgery: [phone]   Remember:   Do not eat food or drink liquids after midnight.   Take these medicines the morning of surgery with A SIP OF WATER: Celexa, Diazepam (if needed), Allegra, Flonase, Gabapentin, Hydrocodone (if needed)   STOP Aspirin today  Do not take Aspirin, Aleve, Naproxen, Advil, Ibuprofen, Vitamin, Herbs, or Supplements starting today   Do not wear jewelry, make-up or nail polish.  Do not wear lotions, powders, or perfumes. You may wear deodorant.  Do not shave 48 hours prior to surgery. Men may shave face and neck.  Do not bring valuables to the hospital.  [facility] is not responsible                  for any belongings or valuables.               Contacts, dentures or bridgework may not be worn into surgery.  Leave suitcase in the car. After surgery it may be brought to your room.  For patients admitted to the hospital, discharge time is determined by your                treatment team.               Special Instructions: Shower using CHG 2 nights before surgery and the night before surgery.  If you shower the day of surgery use CHG.  Use special wash - you have one bottle of CHG for all showers.  You should use approximately 1/3 of the bottle for each shower.   Please read over the following fact sheets that you were given: Pain Booklet, Coughing and Deep Breathing, Blood Transfusion Information and Surgical Site Infection Prevention

## 2013-05-11 ENCOUNTER — Encounter (HOSPITAL_COMMUNITY)
Admission: RE | Admit: 2013-05-11 | Discharge: 2013-05-11 | Disposition: A | Discharge status: Home / Self Care | Payer: BC Managed Care – PPO | Source: Ambulatory Visit | Attending: Orthopedic Surgery | Admitting: Orthopedic Surgery

## 2013-05-11 ENCOUNTER — Encounter (HOSPITAL_COMMUNITY): Payer: Self-pay

## 2013-05-11 DIAGNOSIS — Z01810 Encounter for preprocedural cardiovascular examination: Secondary | ICD-10-CM | POA: Insufficient documentation

## 2013-05-11 DIAGNOSIS — Z01812 Encounter for preprocedural laboratory examination: Secondary | ICD-10-CM | POA: Insufficient documentation

## 2013-05-11 DIAGNOSIS — Z01818 Encounter for other preprocedural examination: Secondary | ICD-10-CM | POA: Insufficient documentation

## 2013-05-11 HISTORY — DX: Anxiety disorder, unspecified: F41.9

## 2013-05-11 LAB — BASIC METABOLIC PANEL
BUN: 11 mg/dL (ref 6–23)
CO2: 26 mEq/L (ref 19–32)
Calcium: 9 mg/dL (ref 8.4–10.5)
GFR calc non Af Amer: >90 mL/min (ref >90)
Glucose, Bld: 184 mg/dL — ABNORMAL HIGH (ref 70–99)
Sodium: 137 mEq/L (ref 135–145)

## 2013-05-11 LAB — CBC
MCH: 30.5 pg (ref 26.0–34.0)
MCHC: 33.7 g/dL (ref 30.0–36.0)
MCV: 90.6 fL (ref 78.0–100.0)
Platelets: 388 10*3/uL (ref 150–400)
RBC: 4.03 MIL/uL (ref 3.87–5.11)
RDW: 12.9 % (ref 11.5–15.5)
WBC: 5.6 10*3/uL (ref 4.0–10.5)

## 2013-05-11 LAB — SURGICAL PCR SCREEN: Staphylococcus aureus: NEGATIVE

## 2013-05-11 LAB — ABO/RH: ABO/RH(D): O POS

## 2013-05-11 NOTE — Progress Notes (Signed)
PCP is Dr. Corrie Mckusick Denies seeing a Cardiologist. Denies having a recent EKG or CXR. Denies having an echo, stress test. Or card cath.

## 2013-05-12 NOTE — Progress Notes (Signed)
Anesthesia Chart Review:  Patient is a 52 year old female scheduled for L4-5 decompression and cyst removal, L5-S1 decompression/fusion on 05/14/13 by Dr. Shon Baton.  History includes former smoker, fibromyalgia, DM on insulin, anemia, depression, anxiety.  EKG on 05/11/13 showed NSR, possible LAE, non-specific ST/T wave changes.  Improved inferolateral T wave abnormality since 04/14/12.  Nuclear stress test on 09/03/08 showed: Normal LV systolic function and wall motion with no evidence for ischemia. There is a fixed defect at the apex that may represent apical thinning. Wall motion appears normal at the apex so apical infarction seems less likely.  Preoperative CXR and labs noted.  Preoperative diagnostics appear acceptable for OR.  She will be further evaluated by her assigned anesthesiologist on the day of surgery.  No CV symptoms were documented at her PAT visit, so if no acute changes then I would anticipate that she could proceed as planned.  Velna Ochs [facility] Short Stay Center/Anesthesiology Phone [phone] 05/12/2013 9:58 AM

## 2013-05-13 MED ORDER — CEFAZOLIN SODIUM-DEXTROSE 2-3 GM-% IV SOLR
2.0000 g | INTRAVENOUS | Status: AC
  Administered 2013-05-14 (×2): 2 g via INTRAVENOUS
  Filled 2013-05-13: qty 50

## 2013-05-13 MED ORDER — DEXAMETHASONE SODIUM PHOSPHATE 4 MG/ML IJ SOLN
4.0000 mg | Freq: Once | INTRAMUSCULAR | Status: AC
  Administered 2013-05-14: 4 mg via INTRAVENOUS
  Filled 2013-05-13: qty 1

## 2013-05-13 MED ORDER — ACETAMINOPHEN 10 MG/ML IV SOLN
1000.0000 mg | Freq: Four times a day (QID) | INTRAVENOUS | Status: DC
  Administered 2013-05-14: 1000 mg via INTRAVENOUS
  Filled 2013-05-13: qty 100

## 2013-05-14 ENCOUNTER — Encounter (HOSPITAL_COMMUNITY)
Admission: RE | Disposition: A | Discharge status: Home Health | Payer: Self-pay | Source: Ambulatory Visit | Attending: Orthopedic Surgery

## 2013-05-14 ENCOUNTER — Encounter (HOSPITAL_COMMUNITY): Payer: Self-pay | Admitting: *Deleted

## 2013-05-14 ENCOUNTER — Encounter (HOSPITAL_COMMUNITY): Payer: BC Managed Care – PPO | Admitting: Vascular Surgery

## 2013-05-14 ENCOUNTER — Inpatient Hospital Stay (HOSPITAL_COMMUNITY): Payer: BC Managed Care – PPO

## 2013-05-14 ENCOUNTER — Inpatient Hospital Stay (HOSPITAL_COMMUNITY)
Admission: RE | Admit: 2013-05-14 | Discharge: 2013-05-18 | DRG: 460 | Disposition: A | Discharge status: Home Health | Payer: BC Managed Care – PPO | Source: Ambulatory Visit | Attending: Orthopedic Surgery | Admitting: Orthopedic Surgery

## 2013-05-14 ENCOUNTER — Inpatient Hospital Stay (HOSPITAL_COMMUNITY): Payer: BC Managed Care – PPO | Admitting: Certified Registered"

## 2013-05-14 DIAGNOSIS — M7130 Other bursal cyst, unspecified site: Secondary | ICD-10-CM | POA: Diagnosis present

## 2013-05-14 DIAGNOSIS — Z87891 Personal history of nicotine dependence: Secondary | ICD-10-CM

## 2013-05-14 DIAGNOSIS — Q762 Congenital spondylolisthesis: Principal | ICD-10-CM

## 2013-05-14 DIAGNOSIS — F3289 Other specified depressive episodes: Secondary | ICD-10-CM | POA: Diagnosis present

## 2013-05-14 DIAGNOSIS — E109 Type 1 diabetes mellitus without complications: Secondary | ICD-10-CM | POA: Diagnosis present

## 2013-05-14 DIAGNOSIS — Z981 Arthrodesis status: Secondary | ICD-10-CM

## 2013-05-14 DIAGNOSIS — F329 Major depressive disorder, single episode, unspecified: Secondary | ICD-10-CM | POA: Diagnosis present

## 2013-05-14 DIAGNOSIS — F411 Generalized anxiety disorder: Secondary | ICD-10-CM | POA: Diagnosis present

## 2013-05-14 DIAGNOSIS — B9689 Other specified bacterial agents as the cause of diseases classified elsewhere: Secondary | ICD-10-CM | POA: Diagnosis not present

## 2013-05-14 DIAGNOSIS — IMO0001 Reserved for inherently not codable concepts without codable children: Secondary | ICD-10-CM | POA: Diagnosis present

## 2013-05-14 DIAGNOSIS — Z794 Long term (current) use of insulin: Secondary | ICD-10-CM

## 2013-05-14 DIAGNOSIS — N390 Urinary tract infection, site not specified: Secondary | ICD-10-CM | POA: Diagnosis not present

## 2013-05-14 HISTORY — PX: LUMBAR LAMINECTOMY/DECOMPRESSION MICRODISCECTOMY: SHX5026

## 2013-05-14 LAB — GLUCOSE, CAPILLARY
Glucose-Capillary: 78 mg/dL (ref 70–99)
Glucose-Capillary: 146 mg/dL — ABNORMAL HIGH (ref 70–99)
Glucose-Capillary: 255 mg/dL — ABNORMAL HIGH (ref 70–99)
Glucose-Capillary: 233 mg/dL — ABNORMAL HIGH (ref 70–99)
Glucose-Capillary: 219 mg/dL — ABNORMAL HIGH (ref 70–99)
Glucose-Capillary: 215 mg/dL — ABNORMAL HIGH (ref 70–99)

## 2013-05-14 SURGERY — POSTERIOR LUMBAR FUSION 1 LEVEL
Anesthesia: General | Site: Spine Lumbar | Wound class: Clean

## 2013-05-14 MED ORDER — PROMETHAZINE HCL 25 MG/ML IJ SOLN: 6.2500 mg | INTRAMUSCULAR | Status: DC | PRN

## 2013-05-14 MED ORDER — FENTANYL CITRATE 0.05 MG/ML IJ SOLN
INTRAMUSCULAR | Status: DC | PRN
  Administered 2013-05-14: 150 ug via INTRAVENOUS
  Administered 2013-05-14 (×2): 50 ug via INTRAVENOUS
  Administered 2013-05-14: 100 ug via INTRAVENOUS
  Administered 2013-05-14: 50 ug via INTRAVENOUS
  Administered 2013-05-14: 100 ug via INTRAVENOUS

## 2013-05-14 MED ORDER — THROMBIN 20000 UNITS EX SOLR
CUTANEOUS | Status: DC | PRN
  Administered 2013-05-14: 09:00:00 via TOPICAL

## 2013-05-14 MED ORDER — INSULIN GLARGINE 100 UNIT/ML ~~LOC~~ SOLN
60.0000 [IU] | Freq: Every day | SUBCUTANEOUS | Status: DC
  Administered 2013-05-14 – 2013-05-17 (×4): 60 [IU] via SUBCUTANEOUS
  Filled 2013-05-14 (×5): qty 0.6

## 2013-05-14 MED ORDER — ZOLPIDEM TARTRATE 5 MG PO TABS
5.0000 mg | ORAL_TABLET | Freq: Every evening | ORAL | Status: DC | PRN
  Administered 2013-05-16: 5 mg via ORAL
  Filled 2013-05-14: qty 1

## 2013-05-14 MED ORDER — SUCCINYLCHOLINE CHLORIDE 20 MG/ML IJ SOLN
INTRAMUSCULAR | Status: DC | PRN
  Administered 2013-05-14: 100 mg via INTRAVENOUS

## 2013-05-14 MED ORDER — MORPHINE SULFATE 2 MG/ML IJ SOLN
1.0000 mg | INTRAMUSCULAR | Status: DC | PRN
  Administered 2013-05-15: 3 mg via INTRAVENOUS
  Administered 2013-05-15 – 2013-05-18 (×4): 2 mg via INTRAVENOUS
  Filled 2013-05-14 (×2): qty 1
  Filled 2013-05-14: qty 2
  Filled 2013-05-14 (×2): qty 1

## 2013-05-14 MED ORDER — SODIUM CHLORIDE 0.9 % IJ SOLN: 3.0000 mL | INTRAMUSCULAR | Status: DC | PRN

## 2013-05-14 MED ORDER — PHENYLEPHRINE HCL 10 MG/ML IJ SOLN
INTRAMUSCULAR | Status: DC | PRN
  Administered 2013-05-14 (×6): 80 ug via INTRAVENOUS

## 2013-05-14 MED ORDER — OXYCODONE HCL 5 MG/5ML PO SOLN: 5.0000 mg | Freq: Once | ORAL | Status: DC | PRN

## 2013-05-14 MED ORDER — HYDROMORPHONE HCL PF 1 MG/ML IJ SOLN
INTRAMUSCULAR | Status: AC
  Filled 2013-05-14: qty 1

## 2013-05-14 MED ORDER — LACTATED RINGERS IV SOLN
INTRAVENOUS | Status: DC | PRN
  Administered 2013-05-14: 09:00:00 via INTRAVENOUS

## 2013-05-14 MED ORDER — OXYCODONE HCL 5 MG PO TABS: 5.0000 mg | ORAL_TABLET | Freq: Once | ORAL | Status: DC | PRN

## 2013-05-14 MED ORDER — PHENYLEPHRINE HCL 10 MG/ML IJ SOLN
10.0000 mg | INTRAVENOUS | Status: DC | PRN
  Administered 2013-05-14: 20 ug/min via INTRAVENOUS

## 2013-05-14 MED ORDER — MORPHINE SULFATE (PF) 1 MG/ML IV SOLN
INTRAVENOUS | Status: DC
  Administered 2013-05-14: 20:00:00 via INTRAVENOUS
  Administered 2013-05-14: 17 mg via INTRAVENOUS
  Administered 2013-05-14: 7 mg via INTRAVENOUS
  Administered 2013-05-14: 14:00:00 via INTRAVENOUS
  Administered 2013-05-15: 13 mg via INTRAVENOUS
  Administered 2013-05-15: 05:00:00 via INTRAVENOUS
  Administered 2013-05-15: 3 mg via INTRAVENOUS
  Filled 2013-05-14 (×2): qty 25

## 2013-05-14 MED ORDER — CEFAZOLIN SODIUM 1-5 GM-% IV SOLN
1.0000 g | Freq: Three times a day (TID) | INTRAVENOUS | Status: AC
  Administered 2013-05-14 – 2013-05-15 (×2): 1 g via INTRAVENOUS
  Filled 2013-05-14 (×2): qty 50

## 2013-05-14 MED ORDER — ONDANSETRON HCL 4 MG/2ML IJ SOLN
INTRAMUSCULAR | Status: DC | PRN
  Administered 2013-05-14: 4 mg via INTRAVENOUS

## 2013-05-14 MED ORDER — LIDOCAINE HCL (CARDIAC) 20 MG/ML IV SOLN
INTRAVENOUS | Status: DC | PRN
  Administered 2013-05-14: 80 mg via INTRAVENOUS

## 2013-05-14 MED ORDER — METHOCARBAMOL 100 MG/ML IJ SOLN
500.0000 mg | Freq: Four times a day (QID) | INTRAVENOUS | Status: DC | PRN
  Filled 2013-05-14: qty 5

## 2013-05-14 MED ORDER — 0.9 % SODIUM CHLORIDE (POUR BTL) OPTIME
TOPICAL | Status: DC | PRN
  Administered 2013-05-14: 1000 mL

## 2013-05-14 MED ORDER — SODIUM CHLORIDE 0.9 % IJ SOLN
3.0000 mL | Freq: Two times a day (BID) | INTRAMUSCULAR | Status: DC
  Administered 2013-05-14 – 2013-05-18 (×6): 3 mL via INTRAVENOUS

## 2013-05-14 MED ORDER — ARTIFICIAL TEARS OP OINT
TOPICAL_OINTMENT | OPHTHALMIC | Status: DC | PRN
  Administered 2013-05-14: 1 via OPHTHALMIC

## 2013-05-14 MED ORDER — BUPIVACAINE-EPINEPHRINE 0.25% -1:200000 IJ SOLN
INTRAMUSCULAR | Status: DC | PRN
  Administered 2013-05-14: 10 mL

## 2013-05-14 MED ORDER — INSULIN ASPART 100 UNIT/ML ~~LOC~~ SOLN
0.0000 [IU] | SUBCUTANEOUS | Status: DC
  Administered 2013-05-14: 5 [IU] via SUBCUTANEOUS

## 2013-05-14 MED ORDER — MIDAZOLAM HCL 5 MG/5ML IJ SOLN
INTRAMUSCULAR | Status: DC | PRN
  Administered 2013-05-14: 2 mg via INTRAVENOUS

## 2013-05-14 MED ORDER — EPHEDRINE SULFATE 50 MG/ML IJ SOLN
INTRAMUSCULAR | Status: DC | PRN
  Administered 2013-05-14 (×3): 5 mg via INTRAVENOUS
  Administered 2013-05-14 (×2): 10 mg via INTRAVENOUS

## 2013-05-14 MED ORDER — HYDROMORPHONE HCL PF 1 MG/ML IJ SOLN
0.2500 mg | INTRAMUSCULAR | Status: DC | PRN
  Administered 2013-05-14 (×2): 0.5 mg via INTRAVENOUS

## 2013-05-14 MED ORDER — ACETAMINOPHEN 10 MG/ML IV SOLN
1000.0000 mg | Freq: Four times a day (QID) | INTRAVENOUS | Status: AC
  Administered 2013-05-14 – 2013-05-15 (×4): 1000 mg via INTRAVENOUS
  Filled 2013-05-14 (×4): qty 100

## 2013-05-14 MED ORDER — CITALOPRAM HYDROBROMIDE 20 MG PO TABS
20.0000 mg | ORAL_TABLET | Freq: Every day | ORAL | Status: DC
  Administered 2013-05-15 – 2013-05-18 (×4): 20 mg via ORAL
  Filled 2013-05-14 (×4): qty 1

## 2013-05-14 MED ORDER — SODIUM CHLORIDE 0.9 % IV SOLN
250.0000 mL | INTRAVENOUS | Status: DC
  Administered 2013-05-15: 250 mL via INTRAVENOUS

## 2013-05-14 MED ORDER — HEMOSTATIC AGENTS (NO CHARGE) OPTIME
TOPICAL | Status: DC | PRN
  Administered 2013-05-14: 1 via TOPICAL

## 2013-05-14 MED ORDER — SODIUM CHLORIDE 0.9 % IJ SOLN: 9.0000 mL | INTRAMUSCULAR | Status: DC | PRN

## 2013-05-14 MED ORDER — DIPHENHYDRAMINE HCL 12.5 MG/5ML PO ELIX: 12.5000 mg | ORAL_SOLUTION | Freq: Four times a day (QID) | ORAL | Status: DC | PRN

## 2013-05-14 MED ORDER — ONDANSETRON HCL 4 MG/2ML IJ SOLN
4.0000 mg | INTRAMUSCULAR | Status: DC | PRN
  Administered 2013-05-18: 4 mg via INTRAVENOUS
  Filled 2013-05-14: qty 2

## 2013-05-14 MED ORDER — MORPHINE SULFATE (PF) 1 MG/ML IV SOLN
INTRAVENOUS | Status: AC
  Administered 2013-05-15: 28 mg via INTRAVENOUS
  Filled 2013-05-14: qty 25

## 2013-05-14 MED ORDER — METHOCARBAMOL 500 MG PO TABS
500.0000 mg | ORAL_TABLET | Freq: Four times a day (QID) | ORAL | Status: DC | PRN
  Administered 2013-05-14 – 2013-05-18 (×11): 500 mg via ORAL
  Filled 2013-05-14 (×12): qty 1

## 2013-05-14 MED ORDER — NALOXONE HCL 0.4 MG/ML IJ SOLN: 0.4000 mg | INTRAMUSCULAR | Status: DC | PRN

## 2013-05-14 MED ORDER — DIPHENHYDRAMINE HCL 50 MG/ML IJ SOLN: 12.5000 mg | Freq: Four times a day (QID) | INTRAMUSCULAR | Status: DC | PRN

## 2013-05-14 MED ORDER — PROPOFOL 10 MG/ML IV BOLUS
INTRAVENOUS | Status: DC | PRN
  Administered 2013-05-14: 200 mg via INTRAVENOUS

## 2013-05-14 MED ORDER — ONDANSETRON HCL 4 MG/2ML IJ SOLN: 4.0000 mg | Freq: Four times a day (QID) | INTRAMUSCULAR | Status: DC | PRN

## 2013-05-14 MED ORDER — GABAPENTIN 300 MG PO CAPS
300.0000 mg | ORAL_CAPSULE | Freq: Three times a day (TID) | ORAL | Status: DC
  Administered 2013-05-14 – 2013-05-18 (×11): 300 mg via ORAL
  Filled 2013-05-14 (×14): qty 1

## 2013-05-14 MED ORDER — DOCUSATE SODIUM 100 MG PO CAPS
100.0000 mg | ORAL_CAPSULE | Freq: Two times a day (BID) | ORAL | Status: DC
  Administered 2013-05-14 – 2013-05-18 (×8): 100 mg via ORAL
  Filled 2013-05-14 (×8): qty 1

## 2013-05-14 MED ORDER — CEFAZOLIN SODIUM-DEXTROSE 2-3 GM-% IV SOLR
2.0000 g | INTRAVENOUS | Status: DC
  Filled 2013-05-14: qty 50

## 2013-05-14 MED ORDER — THROMBIN 20000 UNITS EX SOLR
CUTANEOUS | Status: AC
  Filled 2013-05-14: qty 20000

## 2013-05-14 MED ORDER — OXYCODONE HCL 5 MG PO TABS
10.0000 mg | ORAL_TABLET | ORAL | Status: DC | PRN
  Administered 2013-05-14 – 2013-05-18 (×19): 10 mg via ORAL
  Filled 2013-05-14 (×19): qty 2

## 2013-05-14 MED ORDER — LACTATED RINGERS IV SOLN
INTRAVENOUS | Status: DC
  Administered 2013-05-14 (×3): via INTRAVENOUS

## 2013-05-14 MED ORDER — LOSARTAN POTASSIUM 50 MG PO TABS
50.0000 mg | ORAL_TABLET | Freq: Every day | ORAL | Status: DC
  Administered 2013-05-15 – 2013-05-18 (×4): 50 mg via ORAL
  Filled 2013-05-14 (×5): qty 1

## 2013-05-14 MED ORDER — PHENOL 1.4 % MT LIQD: 1.0000 | OROMUCOSAL | Status: DC | PRN

## 2013-05-14 MED ORDER — PNEUMOCOCCAL VAC POLYVALENT 25 MCG/0.5ML IJ INJ
0.5000 mL | INJECTION | INTRAMUSCULAR | Status: AC
  Administered 2013-05-18: 0.5 mL via INTRAMUSCULAR
  Filled 2013-05-14 (×2): qty 0.5

## 2013-05-14 MED ORDER — HYDROCHLOROTHIAZIDE 25 MG PO TABS
25.0000 mg | ORAL_TABLET | Freq: Every day | ORAL | Status: DC
  Administered 2013-05-15 – 2013-05-18 (×4): 25 mg via ORAL
  Filled 2013-05-14 (×5): qty 1

## 2013-05-14 MED ORDER — OXYCODONE HCL 5 MG PO TABS
ORAL_TABLET | ORAL | Status: AC
  Administered 2013-05-15: 10 mg via ORAL
  Filled 2013-05-14: qty 2

## 2013-05-14 MED ORDER — MENTHOL 3 MG MT LOZG
1.0000 | LOZENGE | OROMUCOSAL | Status: DC | PRN
  Administered 2013-05-15: 3 mg via ORAL
  Filled 2013-05-14: qty 9

## 2013-05-14 MED ORDER — LACTATED RINGERS IV SOLN
INTRAVENOUS | Status: DC
  Administered 2013-05-14 (×2): via INTRAVENOUS

## 2013-05-14 MED ORDER — BUPIVACAINE-EPINEPHRINE PF 0.25-1:200000 % IJ SOLN
INTRAMUSCULAR | Status: AC
  Filled 2013-05-14: qty 30

## 2013-05-14 MED ORDER — FLEET ENEMA 7-19 GM/118ML RE ENEM: 1.0000 | ENEMA | Freq: Once | RECTAL | Status: AC | PRN

## 2013-05-14 SURGICAL SUPPLY — 85 items
BLADE SURG ROTATE 9660 (MISCELLANEOUS) IMPLANT
BUR EGG ELITE 4.0 (BURR) IMPLANT
BUR MATCHSTICK NEURO 3.0 LAGG (BURR) IMPLANT
CANISTER SUCTION 2500CC (MISCELLANEOUS) ×2 IMPLANT
CLIP NEUROVISION LG (CLIP) ×2 IMPLANT
CLOTH BEACON ORANGE TIMEOUT ST (SAFETY) IMPLANT
CLSR STERI-STRIP ANTIMIC 1/2X4 (GAUZE/BANDAGES/DRESSINGS) ×4 IMPLANT
CORDS BIPOLAR (ELECTRODE) ×2 IMPLANT
COVER MAYO STAND STRL (DRAPES) ×4 IMPLANT
COVER SURGICAL LIGHT HANDLE (MISCELLANEOUS) ×4 IMPLANT
DRAIN CHANNEL 15F RND FF W/TCR (WOUND CARE) IMPLANT
DRAPE C-ARM 42X72 X-RAY (DRAPES) ×4 IMPLANT
DRAPE ORTHO SPLIT 77X108 STRL (DRAPES) ×2
DRAPE POUCH INSTRU U-SHP 10X18 (DRAPES) ×2 IMPLANT
DRAPE SURG 17X23 STRL (DRAPES) ×6 IMPLANT
DRAPE SURG ORHT 6 SPLT 77X108 (DRAPES) ×1 IMPLANT
DRAPE U-SHAPE 47X51 STRL (DRAPES) ×2 IMPLANT
DRSG MEPILEX BORDER 4X8 (GAUZE/BANDAGES/DRESSINGS) ×4 IMPLANT
DURAPREP 26ML APPLICATOR (WOUND CARE) ×2 IMPLANT
ELECT BLADE 4.0 EZ CLEAN MEGAD (MISCELLANEOUS) ×2
ELECT BLADE 6.5 EXT (BLADE) IMPLANT
ELECT CAUTERY BLADE 6.4 (BLADE) ×2 IMPLANT
ELECT REM PT RETURN 9FT ADLT (ELECTROSURGICAL) ×2
ELECTRODE BLDE 4.0 EZ CLN MEGD (MISCELLANEOUS) ×1 IMPLANT
ELECTRODE REM PT RTRN 9FT ADLT (ELECTROSURGICAL) ×1 IMPLANT
EVACUATOR 1/8 PVC DRAIN (DRAIN) IMPLANT
EVACUATOR SILICONE 100CC (DRAIN) IMPLANT
GLOVE BIOGEL PI IND STRL 8 (GLOVE) ×1 IMPLANT
GLOVE BIOGEL PI IND STRL 8.5 (GLOVE) ×1 IMPLANT
GLOVE BIOGEL PI INDICATOR 8 (GLOVE) ×1
GLOVE BIOGEL PI INDICATOR 8.5 (GLOVE) ×1
GLOVE ECLIPSE 8.5 STRL (GLOVE) ×2 IMPLANT
GLOVE ORTHO TXT STRL SZ7.5 (GLOVE) ×2 IMPLANT
GOWN PREVENTION PLUS XXLARGE (GOWN DISPOSABLE) ×2 IMPLANT
GOWN STRL NON-REIN LRG LVL3 (GOWN DISPOSABLE) ×2 IMPLANT
GOWN STRL REIN 2XL XLG LVL4 (GOWN DISPOSABLE) ×2 IMPLANT
GOWN STRL REIN XL XLG (GOWN DISPOSABLE) ×4 IMPLANT
GUIDEWIRE NITINOL BEVEL TIP (WIRE) ×10 IMPLANT
KIT BASIN OR (CUSTOM PROCEDURE TRAY) ×2 IMPLANT
KIT NEEDLE NVM5 EMG ELECT (KITS) ×1 IMPLANT
KIT NEEDLE NVM5 EMG ELECTRODE (KITS) ×1
KIT POSITION SURG JACKSON T1 (MISCELLANEOUS) ×2 IMPLANT
KIT ROOM TURNOVER OR (KITS) ×2 IMPLANT
LIGHT SOURCE ANGLE TIP STR 7FT (MISCELLANEOUS) ×2 IMPLANT
MAS TLIF HOOP SHIM (KITS) ×2 IMPLANT
NEEDLE 22X1 1/2 (OR ONLY) (NEEDLE) ×2 IMPLANT
NEEDLE I-PASS III (NEEDLE) ×2 IMPLANT
NEEDLE SPNL 18GX3.5 QUINCKE PK (NEEDLE) ×4 IMPLANT
NS IRRIG 1000ML POUR BTL (IV SOLUTION) ×2 IMPLANT
PACK LAMINECTOMY ORTHO (CUSTOM PROCEDURE TRAY) ×2 IMPLANT
PACK UNIVERSAL I (CUSTOM PROCEDURE TRAY) ×2 IMPLANT
PAD ARMBOARD 7.5X6 YLW CONV (MISCELLANEOUS) ×4 IMPLANT
PATTIES SURGICAL .5 X.5 (GAUZE/BANDAGES/DRESSINGS) IMPLANT
PATTIES SURGICAL .5 X1 (DISPOSABLE) ×2 IMPLANT
PRECEPT SHANK 6.5X45 (Neuro Prosthesis/Implant) ×2 IMPLANT
PRECEPT SHANK CANN 6.5X40 (Neuro Prosthesis/Implant) ×2 IMPLANT
PRECEPT TULIPS (Neuro Prosthesis/Implant) ×4 IMPLANT
PROBE BALL TIP NVM5 SNG USE (BALLOONS) ×2 IMPLANT
ROD 45MM (Rod) ×4 IMPLANT
SCREW PRECEPT 6.5X40 (Screw) ×2 IMPLANT
SCREW PRECEPT 6.5X45 (Screw) ×2 IMPLANT
SCREW PRECEPT SET (Screw) ×8 IMPLANT
SCREW SHANK PRECEPT 7.5X45MM (Screw) ×2 IMPLANT
SHEET CONFORM 45LX20WX5H (Bone Implant) ×2 IMPLANT
SPACER OPAL 12MM (Orthopedic Implant) ×2 IMPLANT
SPONGE LAP 4X18 X RAY DECT (DISPOSABLE) ×4 IMPLANT
SPONGE SURGIFOAM ABS GEL 100 (HEMOSTASIS) ×2 IMPLANT
SURGIFLO TRUKIT (HEMOSTASIS) ×4 IMPLANT
SUT MON AB 3-0 SH 27 (SUTURE) ×4
SUT MON AB 3-0 SH27 (SUTURE) ×2 IMPLANT
SUT VIC AB 0 CT1 27 (SUTURE) ×2
SUT VIC AB 0 CT1 27XBRD ANBCTR (SUTURE) ×1 IMPLANT
SUT VIC AB 1 CT1 27 (SUTURE)
SUT VIC AB 1 CT1 27XBRD ANBCTR (SUTURE) IMPLANT
SUT VIC AB 1 CTX 18 (SUTURE) ×2 IMPLANT
SUT VIC AB 1 CTX 36 (SUTURE) ×4
SUT VIC AB 1 CTX36XBRD ANBCTR (SUTURE) ×2 IMPLANT
SUT VIC AB 2-0 CT1 18 (SUTURE) ×2 IMPLANT
SYR BULB IRRIGATION 50ML (SYRINGE) ×2 IMPLANT
SYR CONTROL 10ML LL (SYRINGE) ×6 IMPLANT
TOWEL OR 17X24 6PK STRL BLUE (TOWEL DISPOSABLE) ×2 IMPLANT
TOWEL OR 17X26 10 PK STRL BLUE (TOWEL DISPOSABLE) ×2 IMPLANT
TRAY FOLEY CATH 16FRSI W/METER (SET/KITS/TRAYS/PACK) ×2 IMPLANT
WATER STERILE IRR 1000ML POUR (IV SOLUTION) IMPLANT
YANKAUER SUCT BULB TIP NO VENT (SUCTIONS) ×2 IMPLANT

## 2013-05-14 NOTE — H&P (Signed)
No change in clinical exam H+P reviewed  

## 2013-05-14 NOTE — Brief Op Note (Signed)
05/14/2013  1:21 PM  PATIENT:  [name]  52 y.o. 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  POST-OPERATIVE DIAGNOSIS:  DEGENERATIVE GRADE 2 SLIP L5-S1  PROCEDURE:  Procedure(s): TLIF L5-S1 AND RIGHT L4-L5 DECOMPRESSION AND CYST REMOVAL (N/A) RIGHT L4-L5 DECOMPRESSION AND CYST REMOVAL (N/A)  SURGEON:  Surgeon(s) and Role:    * Venita Lick, MD - Primary  PHYSICIAN ASSISTANT:   ASSISTANTS: Zonia Kief   ANESTHESIA:   general  EBL:  Total I/O In: 2400 [I.V.:2400] Out: 750 [Urine:350; Blood:400]  BLOOD ADMINISTERED:none  DRAINS: none   LOCAL MEDICATIONS USED:  MARCAINE     SPECIMEN:  No Specimen  DISPOSITION OF SPECIMEN:  N/A  COUNTS:  YES  TOURNIQUET:  * No tourniquets in log *  DICTATION: .Other Dictation: Dictation Number [id number]  PLAN OF CARE: Admit to inpatient   PATIENT DISPOSITION:  PACU - hemodynamically stable.

## 2013-05-14 NOTE — Anesthesia Preprocedure Evaluation (Addendum)
Anesthesia Evaluation  Patient identified by MRN, date of birth, ID band Patient awake    Reviewed: Allergy & Precautions, H&P , NPO status , Patient's Chart, lab work & pertinent test results  History of Anesthesia Complications Negative for: history of anesthetic complications  Airway Mallampati: I TM Distance: >3 FB Neck ROM: Full    Dental   Pulmonary          Cardiovascular negative cardio ROS      Neuro/Psych Anxiety Depression negative psych ROS   GI/Hepatic negative GI ROS, Neg liver ROS,   Endo/Other  diabetes, Type 1, Insulin Dependent  Renal/GU      Musculoskeletal  (+) Fibromyalgia -  Abdominal   Peds  Hematology   Anesthesia Other Findings   Reproductive/Obstetrics                          Anesthesia Physical Anesthesia Plan  ASA: II  Anesthesia Plan: General   Post-op Pain Management:    Induction: Intravenous  Airway Management Planned: Oral ETT  Additional Equipment:   Intra-op Plan:   Post-operative Plan: Extubation in OR  Informed Consent: I have reviewed the patients History and Physical, chart, labs and discussed the procedure including the risks, benefits and alternatives for the proposed anesthesia with the patient or authorized representative who has indicated his/her understanding and acceptance.     Plan Discussed with: CRNA and Surgeon  Anesthesia Plan Comments:         Anesthesia Quick Evaluation

## 2013-05-14 NOTE — Care Management Utilization Note (Signed)
Utilization review completed. Miraya Cudney, RN BSN 

## 2013-05-14 NOTE — Transfer of Care (Signed)
Immediate Anesthesia Transfer of Care Note  Patient: [name]  Procedure(s) Performed: Procedure(s): TLIF L5-S1 AND RIGHT L4-L5 DECOMPRESSION AND CYST REMOVAL (N/A) RIGHT L4-L5 DECOMPRESSION AND CYST REMOVAL (N/A)  Patient Location: PACU  Anesthesia Type:General  Level of Consciousness: awake and alert   Airway & Oxygen Therapy: Patient Spontanous Breathing and Patient connected to nasal cannula oxygen  Post-op Assessment: Report given to PACU RN, Post -op Vital signs reviewed and stable and Patient moving all extremities X 4  Post vital signs: Reviewed and stable  Complications: No apparent anesthesia complications

## 2013-05-14 NOTE — Anesthesia Procedure Notes (Signed)
Procedure Name: Intubation Date/Time: 05/14/2013 7:48 AM Performed by: Venita Lick Pre-anesthesia Checklist: Patient identified, Timeout performed, Emergency Drugs available, Suction available and Patient being monitored Patient Re-evaluated:Patient Re-evaluated prior to inductionOxygen Delivery Method: Circle system utilized and Simple face mask Preoxygenation: Pre-oxygenation with 100% oxygen Intubation Type: IV induction Ventilation: Mask ventilation without difficulty Laryngoscope Size: Mac and 3 Grade View: Grade II Tube type: Oral Tube size: 7.0 mm Number of attempts: 1 Airway Equipment and Method: Patient positioned with wedge pillow,  Stylet and LTA kit utilized Placement Confirmation: ETT inserted through vocal cords under direct vision,  positive ETCO2 and breath sounds checked- equal and bilateral Secured at: 23 cm Tube secured with: Tape Dental Injury: Teeth and Oropharynx as per pre-operative assessment

## 2013-05-14 NOTE — Preoperative (Signed)
Beta Blockers   Reason not to administer Beta Blockers:Not Applicable 

## 2013-05-14 NOTE — Anesthesia Postprocedure Evaluation (Signed)
Anesthesia Post Note  Patient: [name]  Procedure(s) Performed: Procedure(s) (LRB): TLIF L5-S1 AND RIGHT L4-L5 DECOMPRESSION AND CYST REMOVAL (N/A) RIGHT L4-L5 DECOMPRESSION AND CYST REMOVAL (N/A)  Anesthesia type: general  Patient location: PACU  Post pain: Pain level controlled  Post assessment: Patient's Cardiovascular Status Stable  Last Vitals:  Filed Vitals:   05/14/13 1559  BP:   Pulse:   Temp:   Resp: 16    Post vital signs: Reviewed and stable  Level of consciousness: sedated  Complications: No apparent anesthesia complications

## 2013-05-15 ENCOUNTER — Encounter (HOSPITAL_COMMUNITY): Payer: Self-pay

## 2013-05-15 ENCOUNTER — Inpatient Hospital Stay (HOSPITAL_COMMUNITY): Payer: BC Managed Care – PPO

## 2013-05-15 LAB — BASIC METABOLIC PANEL
BUN: 10 mg/dL (ref 6–23)
Chloride: 99 mEq/L (ref 96–112)
Creatinine, Ser: 0.61 mg/dL (ref 0.50–1.10)
GFR calc Af Amer: >90 mL/min (ref >90)
Glucose, Bld: 221 mg/dL — ABNORMAL HIGH (ref 70–99)
Potassium: 4 mEq/L (ref 3.5–5.1)
Sodium: 135 mEq/L (ref 135–145)

## 2013-05-15 LAB — GLUCOSE, CAPILLARY
Glucose-Capillary: 196 mg/dL — ABNORMAL HIGH (ref 70–99)
Glucose-Capillary: 78 mg/dL (ref 70–99)
Glucose-Capillary: 199 mg/dL — ABNORMAL HIGH (ref 70–99)

## 2013-05-15 LAB — CBC
HCT: 29.9 % — ABNORMAL LOW (ref 36.0–46.0)
Platelets: 303 10*3/uL (ref 150–400)
RDW: 13.3 % (ref 11.5–15.5)
WBC: 8.1 10*3/uL (ref 4.0–10.5)

## 2013-05-15 LAB — HEMOGLOBIN A1C: Mean Plasma Glucose: 246 mg/dL — ABNORMAL HIGH (ref <117)

## 2013-05-15 MED ORDER — ONDANSETRON HCL 4 MG PO TABS: 4.0000 mg | ORAL_TABLET | Freq: Three times a day (TID) | ORAL | Status: DC | PRN

## 2013-05-15 MED ORDER — POLYETHYLENE GLYCOL 3350 17 GM/SCOOP PO POWD: 17.0000 g | Freq: Every day | ORAL | Status: DC

## 2013-05-15 MED ORDER — INSULIN ASPART 100 UNIT/ML ~~LOC~~ SOLN
0.0000 [IU] | Freq: Three times a day (TID) | SUBCUTANEOUS | Status: DC
  Administered 2013-05-15 – 2013-05-16 (×3): 3 [IU] via SUBCUTANEOUS
  Administered 2013-05-16: 8 [IU] via SUBCUTANEOUS
  Administered 2013-05-16 – 2013-05-17 (×2): 5 [IU] via SUBCUTANEOUS
  Administered 2013-05-17: 8 [IU] via SUBCUTANEOUS
  Administered 2013-05-17: 5 [IU] via SUBCUTANEOUS
  Administered 2013-05-18 (×2): 3 [IU] via SUBCUTANEOUS

## 2013-05-15 MED ORDER — METHOCARBAMOL 500 MG PO TABS: 500.0000 mg | ORAL_TABLET | Freq: Three times a day (TID) | ORAL | Status: DC | PRN

## 2013-05-15 MED ORDER — OXYCODONE-ACETAMINOPHEN 10-325 MG PO TABS: 1.0000 | ORAL_TABLET | ORAL | Status: DC | PRN

## 2013-05-15 MED ORDER — ACETAMINOPHEN 325 MG PO TABS
650.0000 mg | ORAL_TABLET | Freq: Four times a day (QID) | ORAL | Status: DC | PRN
  Administered 2013-05-15 – 2013-05-18 (×7): 650 mg via ORAL
  Filled 2013-05-15 (×8): qty 2

## 2013-05-15 MED ORDER — DOCUSATE SODIUM 100 MG PO CAPS: 100.0000 mg | ORAL_CAPSULE | Freq: Three times a day (TID) | ORAL | Status: DC | PRN

## 2013-05-15 NOTE — Op Note (Signed)
[name], [name]           ACCOUNT NO.:  [id number]  MEDICAL RECORD NO.:  [id number]  LOCATION:  5N01C                        FACILITY:  [facility]  PHYSICIAN:  Alvy Beal, MD    DATE OF BIRTH:  1960-11-14  DATE OF PROCEDURE: DATE OF DISCHARGE:                              OPERATIVE REPORT   POSTOPERATIVE DIAGNOSIS: 1. 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 2. 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  POSTOPERATIVE DIAGNOSIS: 1. 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 2. 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  PROCEDURE: 1. Right L4-5 facetectomy and foraminotomy with excision of synovial     cyst. 2. Gill decompression, right side L5-S1 with complete inferior L5     facet resection. 3. L5-S1 complete diskectomy with implantation of biomechanical     intervertebral device. 4. Posterolateral instrumented fusion L5-S1. 5. Posterolateral arthrodesis L5-S1.  HISTORY:  This is a very pleasant woman, who has been complaining of severe back, buttock, and right leg pain for sometime now.  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  After attempts at conservative management have failed to alleviate her pain, we elected to proceed with surgery.  All appropriate risks, benefits, and alternatives were discussed with the patient.  Consent was obtained.  Operative instrumentation system used was the NuVasive pedicle screw fixation device with a 40 mm screw at L5 and S1 bilaterally.  With the Synthes PEEK go over the tip, intervertebral cage packed with autograft bone from local decompression and allograft contour bone.  OPERATIVE NOTE:  The patient was brought to the operating room, placed supine on the operating table.  After successful induction of general anesthesia and endotracheal intubation, TED, SCDs, and Foley  were inserted.  All appropriate intraoperative needle monitoring were placed by myself for intraoperative EMG and SSEP monitoring for the MIS procedure.  Once all the needles were positioned, the patient was turned prone onto the Wilson frame and all bony prominences were well padded. The back was prepped and draped in a standard fashion.  Time-out was taken to confirm the patient and procedure, and all other pertinent important data.  Once this was completed, I then approached the left side.  Using fluoroscopic guidance under sterile condition, I made 2 stab incisions over the lateral aspect of the L5 and S1 pedicle.  I advanced the Jamshidi needle through this incision down to the lateral aspect of the pedicle.  This was based on lateral aspect of the facet joint in the midline of the transverse process.  I connected the Jamshidi needle to the neuromonitoring device.  Using fluoro and neuromonitoring, I advanced the Jamshidi needle through the pedicle and into the vertebral body.  I repeated this procedure at S1.  I then placed guide pins through the Jamshidi needle and then tapped both guide pins.  I then placed the appropriate size 6.5 diameter pedicle screws at these locations.  I then stimulated the both screws and they both tested positive.  There was no abnormal EMG or SSEP monitoring to suggest breach of the pedicle.  Once the left pedicle screws were  placed, I then turned my attention to the right.  I made a Wiltse incision about 2 fingerbreadths to the lateral aspect of the midline.  Sharp dissection was carried out down to the deep fascia.  Deep fascia was sharply incised.  I then bluntly dissected through the paraspinal muscles until I could palpate the L4-5 facet complex and L5-S1 facet complex.  Once I had both of these facet complexes palpated, I then placed the retracting device into the wound.  I confirmed using fluoro the L4 lamina.  Once this was done, I used a curved  curette to develop a plane underneath the lamina and then using a 3-mm Kerrison performed a generous laminotomy of L4.  I then released the ligamentum flavum from the leading edge of the L5 lamina and then identified the underlying thecal sac.  I then dissected into the lateral gutter until I could visualize the significant L4-5 synovial cyst.  I then performed a facetectomy by removing the medial portion of the inferior L4 facet and superior L5 facet.  This gave me excellent visualization of the cyst.  I then identified the L5 nerve root and then developed the plane using a [facility], dissecting the cyst off the nerve, I then resected the cyst.  The nerve itself was significantly compressed by this nerve.  I then took my elevator superiorly and checked to make sure the remainder of the lateral recess was decompressed.  Once I had done this, I then removed the retracting blade and then used a Jamshidi needle to place pedicle screws at L5 and S1.  Using the same technique that I had used on the contralateral side.  At this time when the pedicle screws went down they were connected to the retracting blades and now I had excellent visualization of the L5-S1 space.  At this point, I then developed a plane underneath the lamina of L5 and started performing a laminotomy of L5 using a 3 mm and 2 mm Kerrison rongeur.  I then completed this and did a complete laminectomy of L5.  This allowed me to better visualize the L5 nerve root.  I then identified the L5 nerve root from its origin and then traced it around the pedicle of L5.  The nerve itself was noted to be significantly contused from the cyst that I had removed.  There was also significant osteophyte causing the nerve to essentially take a 90 degree turn as it exited the foramen and removed these osteophytes from the inferomedial aspect of the L5 pedicle, and this improved the overall contour and trajectory of the nerve.  At  this point, with the L5 nerve completely decompressed, I continued my dissection to remove the entire L5 pars and unroofed the nerve itself. I then removed using a 2 and 3 mm Kerrison, removed the osteophytes from the superior S1 facet complex.  At this point, I had removed the entire L5 inferior facet and portion of the superior S1.  I now had excellent visualization in the posterior lateral aspect of the disk.  Epidural veins were then coagulated with bipolar electrocautery.  At this point, I was very pleased with the decompression, I had an excellent central and lateral recess decompression from L4 down to S1 and L5 nerve root was free of any tension and was completely decompressed.  At this point, I then turned my attention to the interbody fixation.  I placed Neuro Patties to protect the L5 nerve root and to allow some retraction of  the thecal sac.  I then incised the L5-S1 disk and then used sequential palate distractors and shavers as well as pituitary rongeurs, curettes, and Kerrison rongeurs to remove all the disk material at L5-S1.  I then could visualize both endplates and I rasped until I had bleeding of subchondral bone.  I then trialed with the Synthes Opal PEEK interbody spacer and I felt the 12 gave an excellent fixation.  This was graft was obtained and I used the local bone that I had harvested from the decompression to pack inside the cage.  I then took a strip of contour allograft bone, cut it and then put it along the anterior anulus to aid in sentinel fusion.  Once this was in position, I then [facility] the implant to its appropriate depth.  I had excellent fixation and depth. The implant was well within the intervertebral space and it was not proud posteriorly causing either thecal sac or nerve root irritation. With the implant in place, I then packed some bone graft around it.  I then irrigated the wound copiously with normal saline.  At this point, I was pleased  with the fixation.  I then measured for rod and placed the appropriate size rod at L5-S1 and secured it down with the top locking nuts.  This was per manufacture's standards.  I then went to the contralateral side, measured and placed a screw and rod and locked it accordingly.  At this point time, both wounds were copiously irrigated with normal saline.  Hemostasis was obtained using bipolar electrocautery, and then I closed the right side.  First I placed thrombin-soaked Gelfoam patty over the exposed thecal sac and then closed the deep fascia with interrupted #1 Vicryl sutures, superficial 2-0 Vicryl sutures, and 3-0 Monocryl for the skin.  On the left-hand side both incisions were irrigated and closed with 2-0 Vicryl sutures and a 3-0 Monocryl.  Steri- Strips and dry dressing were applied, and the patient was ultimately extubated and transferred to PACU without incident.  At the end of the case, all needle and sponge counts were correct.  There was no adverse intraoperative events.  FIRST ASSISTANT:  Genene Churn. Denton Meek.     Alvy Beal, MD     DDB/MEDQ  D:  05/14/2013  T:  05/15/2013  Job:  [id number]

## 2013-05-15 NOTE — Progress Notes (Signed)
    Subjective: Procedure(s) (LRB): TLIF L5-S1 AND RIGHT L4-L5 DECOMPRESSION AND CYST REMOVAL (N/A) RIGHT L4-L5 DECOMPRESSION AND CYST REMOVAL (N/A) 1 Day Post-Op  Patient reports pain as 2 on 0-10 scale.  Reports decreased leg pain reports incisional back pain   Positive void Negative bowel movement Positive flatus Negative chest pain or shortness of breath  Objective: Vital signs in last 24 hours: Temp:  [97.4 F (36.3 C)-98.6 F (37 C)] 98.6 F (37 C) (10/24 0529) Pulse Rate:  [85-100] 97 (10/24 0529) Resp:  [8-18] 18 (10/24 0529) BP: (111-142)/(52-74) 111/52 mmHg (10/24 0529) SpO2:  [94 %-100 %] 100 % (10/24 0529) Weight:  [87.091 kg (192 lb)] 87.091 kg (192 lb) (10/23 2025)  Intake/Output from previous day: 10/23 0701 - 10/24 0700 In: 4000 [P.O.:960; I.V.:2940; IV Piggyback:100] Out: 2550 [Urine:2150; Blood:400]  Labs:  Recent Labs  05/15/13 0635  WBC 8.1  RBC 3.24*  HCT 29.9*  PLT 303    Recent Labs  05/15/13 0635  NA 135  K 4.0  CL 99  CO2 28  BUN 10  CREATININE 0.61  GLUCOSE 221*  CALCIUM 8.2*   No results found for this basename: LABPT, INR,  in the last 72 hours  Physical Exam: Neurologically intact ABD soft Neurovascular intact Intact pulses distally Incision: dressing C/D/I and no drainage Compartment soft  Assessment/Plan: Patient stable  xrays pending Continue mobilization with physical therapy Continue care  Advance diet Up with therapy D/C IV fluids Plan for discharge tomorrow  Venita Lick, MD [facility] [phone]

## 2013-05-15 NOTE — Progress Notes (Signed)
Note: patient does take 75/25 mixed insulin at home 20-30 units three times per day.  May want to consider changing patient to 70/30 insulin here at the hospital before discharge.  Will continue to follow while in hospital.  Smith Mince RN BSN CDE

## 2013-05-15 NOTE — Evaluation (Signed)
Occupational Therapy Evaluation Patient Details Name: [name] MRN: [id number] DOB: Aug 05, 1960 Today's Date: 05/15/2013 Time: 4098-1191 OT Time Calculation (min): 31 min  OT Assessment / Plan / Recommendation History of present illness Right L4-5 facetectomy and foraminotomy with excision of synovial cyst, Gill decompression, right side L5-S1 with complete inferior L5 facet resection, L5-S1 complete diskectomy with implantation of biomechanical intervertebral device,  Posterolateral instrumented fusion L5-S1,  Posterolateral arthrodesis L5-S1   Clinical Impression   This 52 yo female admitted with above presents to acute OT with deficits below. Will benefit from acute OT without need for follow up OT post acute.    OT Assessment  Patient needs continued OT Services    Follow Up Recommendations  No OT follow up       Equipment Recommendations  3 in 1 bedside comode       Frequency  Min 2X/week    Precautions / Restrictions Precautions Precautions: Back Precaution Booklet Issued: Yes (comment) Required Braces or Orthoses: Spinal Brace Spinal Brace: Applied in sitting position Restrictions Weight Bearing Restrictions: No   Pertinent Vitals/Pain 6/10 back    ADL  Eating/Feeding: Independent Where Assessed - Eating/Feeding: Chair Grooming: Set up Where Assessed - Grooming: Unsupported sitting Upper Body Bathing: Minimal assistance Where Assessed - Upper Body Bathing: Unsupported sitting Lower Body Bathing: Maximal assistance Where Assessed - Lower Body Bathing: Supported sit to stand Upper Body Dressing: Minimal assistance Where Assessed - Upper Body Dressing: Supported sitting Lower Body Dressing: +1 Total assistance Where Assessed - Lower Body Dressing: Supported sit to Pharmacist, hospital: Minimal assistance Toilet Transfer Method: Sit to Barista: Raised toilet seat with arms (or 3-in-1 over toilet) Toileting - Clothing  Manipulation and Hygiene: Moderate assistance Where Assessed - Toileting Clothing Manipulation and Hygiene: Sit to stand from 3-in-1 or toilet Equipment Used: Back brace;Gait belt;Rolling walker Transfers/Ambulation Related to ADLs: min A for all with RW    OT Diagnosis: Generalized weakness;Acute pain  OT Problem List: Decreased activity tolerance;Pain;Impaired balance (sitting and/or standing);Decreased knowledge of use of DME or AE;Decreased knowledge of precautions OT Treatment Interventions: Self-care/ADL training;Patient/family education;DME and/or AE instruction;Balance training   OT Goals(Current goals can be found in the care plan section) Acute Rehab OT Goals OT Goal Formulation: With patient Time For Goal Achievement: 05/22/13 Potential to Achieve Goals: Good  Visit Information  Last OT Received On: 05/15/13 Assistance Needed: +1 PT/OT Co-Evaluation/Treatment: Yes History of Present Illness: Right L4-5 facetectomy and foraminotomy with excision of synovial cyst, Gill decompression, right side L5-S1 with complete inferior L5 facet resection, L5-S1 complete diskectomy with implantation of biomechanical intervertebral device,  Posterolateral instrumented fusion L5-S1,  Posterolateral arthrodesis L5-S1       Prior Functioning     Home Living Family/patient expects to be discharged to:: Private residence Living Arrangements: Children;Other relatives Available Help at Discharge: Family Type of Home: House Home Access: Stairs to enter Secretary/administrator of Steps: 2 Entrance Stairs-Rails: None Home Layout: One level Home Equipment: None Prior Function Level of Independence: Independent Communication Communication: No difficulties Dominant Hand: Right         Vision/Perception Vision - History Patient Visual Report: No change from baseline   Cognition  Cognition Arousal/Alertness: Awake/alert Behavior During Therapy: WFL for tasks assessed/performed Overall  Cognitive Status: Within Functional Limits for tasks assessed Memory: Decreased recall of precautions    Extremity/Trunk Assessment Upper Extremity Assessment Upper Extremity Assessment: Overall WFL for tasks assessed     Mobility Bed Mobility Bed Mobility:  Sitting - Scoot to [place] of Bed Sitting - Scoot to [place] of Bed: 3: Mod assist Details for Bed Mobility Assistance: Pt already sitting up on EOB Transfers Transfers: Sit to Stand;Stand to Sit Sit to Stand: 3: Mod assist;With upper extremity assist;From bed Stand to Sit: 4: Min assist;With upper extremity assist;With armrests;To chair/3-in-1 Details for Transfer Assistance: VCs for safe hand placement           End of Session OT - End of Session Equipment Utilized During Treatment: Gait belt;Rolling walker;Back brace Activity Tolerance: Patient limited by pain Patient left: in chair;with call bell/phone within reach Nurse Communication: Mobility status       Evette Georges [phone] 05/15/2013, 2:22 PM

## 2013-05-15 NOTE — Discharge Summary (Signed)
Patient ID: [name] MRN: [id number] DOB/AGE: June 10, 1961 52 y.o.  Admit date: 05/14/2013 Discharge date: 05/15/2013  Admission Diagnoses:  Active Problems:   * No active hospital problems. *   Discharge Diagnoses:  Active Problems:   * No active hospital problems. *  status post Procedure(s): TLIF L5-S1 AND RIGHT L4-L5 DECOMPRESSION AND CYST REMOVAL RIGHT L4-L5 DECOMPRESSION AND CYST REMOVAL  Past Medical History  Diagnosis Date  . Diabetes mellitus   . Depression   . Constipation   . Fibromyalgia   . Anemia   . Anxiety     Surgeries: Procedure(s): TLIF L5-S1 AND RIGHT L4-L5 DECOMPRESSION AND CYST REMOVAL RIGHT L4-L5 DECOMPRESSION AND CYST REMOVAL on 05/14/2013   Consultants:    Discharged Condition: Improved  Hospital Course: [name] is an 52 y.o. female who was admitted 05/14/2013 for operative treatment of <principal problem not specified>. Patient failed conservative treatments (please see the history and physical for the specifics) and had severe unremitting pain that affects sleep, daily activities and work/hobbies. After pre-op clearance, the patient was taken to the operating room on 05/14/2013 and underwent  Procedure(s): TLIF L5-S1 AND RIGHT L4-L5 DECOMPRESSION AND CYST REMOVAL RIGHT L4-L5 DECOMPRESSION AND CYST REMOVAL.    Patient was given perioperative antibiotics: Anti-infectives   Start     Dose/Rate Route Frequency Ordered Stop   05/14/13 1800  ceFAZolin (ANCEF) IVPB 1 g/50 mL premix     1 g 100 mL/hr over 30 Minutes Intravenous Every 8 hours 05/14/13 1636 05/15/13 0328   05/14/13 1115  ceFAZolin (ANCEF) IVPB 2 g/50 mL premix  Status:  Discontinued     2 g 100 mL/hr over 30 Minutes Intravenous To Surgery 05/14/13 1100 05/14/13 1629   05/13/13 1407  ceFAZolin (ANCEF) IVPB 2 g/50 mL premix     2 g 100 mL/hr over 30 Minutes Intravenous 30 min pre-op 05/13/13 1407 05/14/13 1103       Patient was given sequential  compression devices and early ambulation to prevent DVT.   Patient benefited maximally from hospital stay and there were no complications. At the time of discharge, the patient was urinating/moving their bowels without difficulty, tolerating a regular diet, pain is controlled with oral pain medications and they have been cleared by PT/OT.   Recent vital signs: Patient Vitals for the past 24 hrs:  BP Temp Pulse Resp SpO2 Height Weight  05/15/13 0529 111/52 mmHg 98.6 F (37 C) 97 18 100 % - -  05/15/13 0337 - - - 18 99 % - -  05/15/13 0147 121/66 mmHg 98.2 F (36.8 C) 87 16 100 % - -  05/15/13 0024 - - - 16 100 % - -  05/14/13 2049 118/64 mmHg 97.4 F (36.3 C) 85 16 97 % - -  05/14/13 2025 - - - 16 100 % 5' 5.5" (1.664 m) 87.091 kg (192 lb)  05/14/13 1832 117/65 mmHg 98.5 F (36.9 C) 92 16 98 % - -  05/14/13 1615 138/74 mmHg 98.3 F (36.8 C) 97 16 99 % - -  05/14/13 1559 - - - 16 - - -  05/14/13 1545 121/62 mmHg 97.6 F (36.4 C) 100 12 96 % - -  05/14/13 1530 - - 95 9 98 % - -  05/14/13 1515 - - 93 8 99 % - -  05/14/13 1500 - - 89 12 94 % - -  05/14/13 1445 - - 90 11 99 % - -  05/14/13 1430 131/63 mmHg -  89 11 100 % - -  05/14/13 1415 - - 85 16 100 % - -  05/14/13 1400 - - 88 14 98 % - -  05/14/13 1352 142/70 mmHg 98.6 F (37 C) 93 16 100 % - -     Recent laboratory studies:  Recent Labs  05/15/13 0635  WBC 8.1  HGB 9.8*  HCT 29.9*  PLT 303  NA 135  K 4.0  CL 99  CO2 28  BUN 10  CREATININE 0.61  GLUCOSE 221*  CALCIUM 8.2*     Discharge Medications:     Medication List    STOP taking these medications       cyclobenzaprine 10 MG tablet  Commonly known as:  FLEXERIL     diazepam 10 MG tablet  Commonly known as:  VALIUM     HYDROcodone-acetaminophen 5-325 MG per tablet  Commonly known as:  NORCO/VICODIN      TAKE these medications       aspirin EC 81 MG tablet  Take 81 mg by mouth daily.     atorvastatin 40 MG tablet  Commonly known as:  LIPITOR    Take 40 mg by mouth daily.     citalopram 20 MG tablet  Commonly known as:  CELEXA  Take 20 mg by mouth daily.     docusate sodium 100 MG capsule  Commonly known as:  COLACE  Take 1 capsule (100 mg total) by mouth 3 (three) times daily as needed for constipation.     fexofenadine 180 MG tablet  Commonly known as:  ALLEGRA  Take 180 mg by mouth daily.     FLONASE 50 MCG/ACT nasal spray  Generic drug:  fluticasone  Place 2 sprays into the nose 2 (two) times daily.     gabapentin 300 MG capsule  Commonly known as:  NEURONTIN  Take 300 mg by mouth 3 (three) times daily.     hydrochlorothiazide 25 MG tablet  Commonly known as:  HYDRODIURIL  Take 25 mg by mouth daily.     insulin glargine 100 UNIT/ML injection  Commonly known as:  LANTUS  Inject 60 Units into the skin at bedtime. For BS >200--sliding scale     insulin lispro protamine-lispro (75-25) 100 UNIT/ML Susp injection  Commonly known as:  HUMALOG 75/25  Inject 20-30 Units into the skin 3 (three) times daily with meals. Sliding scale     losartan 50 MG tablet  Commonly known as:  COZAAR  Take 50 mg by mouth daily.     methocarbamol 500 MG tablet  Commonly known as:  ROBAXIN  Take 1 tablet (500 mg total) by mouth 3 (three) times daily as needed.     ondansetron 4 MG tablet  Commonly known as:  ZOFRAN  Take 1 tablet (4 mg total) by mouth every 8 (eight) hours as needed for nausea.     oxyCODONE-acetaminophen 10-325 MG per tablet  Commonly known as:  PERCOCET  Take 1 tablet by mouth every 4 (four) hours as needed for pain.     polyethylene glycol powder powder  Commonly known as:  GLYCOLAX  Take 17 g by mouth daily.     potassium chloride 10 MEQ tablet  Commonly known as:  K-DUR,KLOR-CON  Take 10 mEq by mouth daily.     traZODone 100 MG tablet  Commonly known as:  DESYREL  Take 100 mg by mouth at bedtime.        Diagnostic Studies: Dg Chest 2 View  05/11/2013   CLINICAL DATA:  Preop for lumbar spine  decompression  EXAM: CHEST  2 VIEW  COMPARISON:  01/11/2010  FINDINGS: Cardiomediastinal silhouette is stable. No acute infiltrate or pleural effusion. No pulmonary edema. Stable minimal degenerative changes thoracic spine.  IMPRESSION: No active cardiopulmonary disease.   Electronically Signed   By: Natasha Mead M.D.   On: 05/11/2013 10:49   Dg Lumbar Spine 2-3 Views  05/14/2013   CLINICAL DATA:  L5-S1 TLIF with cyst removal.  EXAM: LUMBAR SPINE - 2-3 VIEW; DG C-ARM GT 120 MIN  COMPARISON:  Radiographs 05/14/2013. Abdominal CT 04/29/2011.  FINDINGS: Spot PA and lateral fluoroscopic images of the lower lumbar spine demonstrate interval pedicle screw and interbody fusion at L5-S1. No complications are identified.  IMPRESSION: Intraoperative views following L5-S1 fusion.   Electronically Signed   By: Roxy Horseman M.D.   On: 05/14/2013 13:36   Dg Lumbar Spine 2-3 Views  05/14/2013   CLINICAL DATA:  Low back pain. Preop films for fusion.  EXAM: LUMBAR SPINE - 2-3 VIEW  COMPARISON:  MRI lumbar spine 04/03/2011  FINDINGS: There is no evidence of lumbar spine fracture. Slight anterior subluxation of L5 on S1. Alignment is otherwise normal. Intervertebral disc spaces are maintained. Degenerative changes at T11-12.  There are 5 lumbar type vertebral bodies. The lumbar vertebrae are numbered on the lateral view.  IMPRESSION: Mild degenerative changes. No acute fracture demonstrated.   Electronically Signed   By: Burman Nieves M.D.   On: 05/14/2013 06:11   Dg C-arm Gt 120 Min  05/14/2013   CLINICAL DATA:  L5-S1 TLIF with cyst removal.  EXAM: LUMBAR SPINE - 2-3 VIEW; DG C-ARM GT 120 MIN  COMPARISON:  Radiographs 05/14/2013. Abdominal CT 04/29/2011.  FINDINGS: Spot PA and lateral fluoroscopic images of the lower lumbar spine demonstrate interval pedicle screw and interbody fusion at L5-S1. No complications are identified.  IMPRESSION: Intraoperative views following L5-S1 fusion.   Electronically Signed   By: Roxy Horseman M.D.   On: 05/14/2013 13:36         Future Appointments Provider Department Dept Phone   05/21/2013 9:00 AM Lazaro Arms, MD [facility] [phone]      Follow-up Information   Follow up with Alvy Beal, MD. Schedule an appointment as soon as possible for a visit in 2 weeks.   Specialty:  Orthopedic Surgery   Contact information:   [address] [place] [address] [phone]       Discharge Plan:  discharge to home.  Patient had a fall in the hospital.  Xrays satisfactory.  Increase in leg pain but not as severe as pre-op.  Ok for d/c to home.  Re-eval in 2 weeks   Disposition: stable    Signed: Aradia Estey D for Dr. Venita Lick [facility] [phone] 05/15/2013, 7:45 AM

## 2013-05-15 NOTE — Evaluation (Signed)
Physical Therapy Evaluation Patient Details Name: [name] MRN: [id number] DOB: 1960/12/27 Today's Date: 05/15/2013 Time: 7829-5621 PT Time Calculation (min): 31 min  PT Assessment / Plan / Recommendation History of Present Illness  52 y.o. female admitted to [facility] on 05/14/13 s/p elective L4/5 decompression L5/S1 fusion.    Clinical Impression  Pt is POD #1 s/p lumbar surgery and she is moving slowly, but steadily with normal post-op pain.  She is guarded and anxious with mobility, but should progress well enough to return home with family's assist at discharge.  She may benefit from HHPT f/u to help her practice her mobility in her home environment and progress gait.   PT to follow acutely for deficits listed below.       PT Assessment  Patient needs continued PT services    Follow Up Recommendations  Home health PT;Supervision for mobility/OOB    Does the patient have the potential to tolerate intense rehabilitation     NA  Barriers to Discharge   None      Equipment Recommendations  Rolling walker with 5" wheels;3in1 (PT)    Recommendations for Other Services   None  Frequency Min 5X/week    Precautions / Restrictions Precautions Precautions: Back Precaution Booklet Issued: Yes (comment) Precaution Comments: handout given and reviewed Required Braces or Orthoses: Spinal Brace Spinal Brace: Applied in sitting position   Pertinent Vitals/Pain See vitals flow sheet.      Mobility  Bed Mobility Bed Mobility: Sitting - Scoot to Edge of Bed Sitting - Scoot to Edge of Bed: 3: Mod assist Details for Bed Mobility Assistance: Pt already sitting up on EOB.  Mod assist to help pt weight shift hips to scoot to edge so her feet could touch the floor.  Transfers Sit to Stand: 3: Mod assist;With upper extremity assist;From bed Stand to Sit: 4: Min assist;With upper extremity assist;With armrests;To chair/3-in-1 Details for Transfer Assistance: VCs for safe hand  placement.  Assist needed to support trunk to get to standing over weak legs.  She had slow transitions due to pain and flexed knees upon first standing.  Verbal cues to slow breathing as pt was starting to hyperventalate.  "I have anxiety" Ambulation/Gait Ambulation/Gait Assistance: 4: Min assist Ambulation Distance (Feet): 20 Feet Assistive device: Rolling walker Ambulation/Gait Assistance Details: min assist to support trunk over weak legs and painful back.  Guarded pattern.   Gait Pattern: Step-through pattern;Shuffle Gait velocity: decreased        PT Diagnosis: Difficulty walking;Abnormality of gait;Acute pain;Generalized weakness  PT Problem List: Decreased strength;Decreased activity tolerance;Decreased balance;Decreased mobility;Decreased knowledge of use of DME;Decreased knowledge of precautions;Pain PT Treatment Interventions: DME instruction;Gait training;Stair training;Functional mobility training;Therapeutic activities;Therapeutic exercise;Balance training;Patient/family education;Neuromuscular re-education;Modalities     PT Goals(Current goals can be found in the care plan section) Acute Rehab PT Goals Patient Stated Goal: to get well enough to go home and decrease her pain PT Goal Formulation: With patient Time For Goal Achievement: 05/22/13 Potential to Achieve Goals: Good  Visit Information  Last PT Received On: 05/15/13 Assistance Needed: +1 PT/OT Co-Evaluation/Treatment: Yes History of Present Illness: 52 y.o. female admitted to [facility] on 05/14/13 s/p elective L4/5 decompression L5/S1 fusion.         Prior Functioning  Home Living Family/patient expects to be discharged to:: Private residence Living Arrangements: Children;Other relatives Available Help at Discharge: Available 24 hours/day;Family Type of Home: House Home Access: Stairs to enter Entrance Stairs-Number of Steps: 2 Entrance Stairs-Rails: None Home Layout: One  level Home Equipment: None Prior  Function Level of Independence: Independent Communication Communication: No difficulties Dominant Hand: Right    Cognition  Cognition Arousal/Alertness: Awake/alert Behavior During Therapy: WFL for tasks assessed/performed Overall Cognitive Status: Within Functional Limits for tasks assessed    Extremity/Trunk Assessment Upper Extremity Assessment Upper Extremity Assessment: Defer to OT evaluation Lower Extremity Assessment Lower Extremity Assessment: Generalized weakness Cervical / Trunk Assessment Cervical / Trunk Assessment: Normal      End of Session PT - End of Session Equipment Utilized During Treatment: Back brace Activity Tolerance: Patient limited by pain Patient left: in chair;with call bell/phone within reach Nurse Communication: Mobility status    Lurena Joiner B. Quintrell Baze, PT, DPT [phone]   05/15/2013, 5:03 PM

## 2013-05-15 NOTE — Progress Notes (Signed)
Pt temp was 103* F orally at 2200. Given Oxy IR 10mg  at 2208 and cold wash cloths to face/neck in attempt to lower temp. Temp retaken, results was 102.8 *F Orally. Pt stated pain level 10/10. IV access obtained and given morphine 3 mg. Called MD on call and was given tylenol PRN order. Order was carried out. Will continue to monitor.

## 2013-05-15 NOTE — Progress Notes (Signed)
SW received a consult for possible placement. PT  At this time is recommending home with HH and not SNF. CSW will make CM aware. Clinical Social Worker will sign off for now as social work intervention is no longer needed. Please consult us again if new need arises.   [name], MSW, LCSWA [phone] 

## 2013-05-15 NOTE — Op Note (Signed)
[name], [name]           ACCOUNT NO.:  [id number]  MEDICAL RECORD NO.:  [id number]  LOCATION:  [id number]                        FACILITY:  [facility]  PHYSICIAN:  Alvy Beal, MD    DATE OF BIRTH:  04-24-1961  DATE OF PROCEDURE:  05/14/2013 DATE OF DISCHARGE:                              OPERATIVE REPORT   ADDENDUM:  It should be noted that the right L5 pedicle screw initially placed and felt it was somewhat lateral until I removed this 6.5 mm screw, repositioned and redirected the Jamshidi needle and placed a large 7.5 diameter screw.  This should had better trajectory, had excellent purchase.     Alvy Beal, MD     DDB/MEDQ  D:  05/14/2013  T:  05/15/2013  Job:  [id number]

## 2013-05-15 NOTE — Plan of Care (Signed)
Problem: Consults Goal: Diagnosis - Spinal Surgery Outcome: Completed/Met Date Met:  05/15/13 Thoraco/Lumbar Spine Fusion--see OP note

## 2013-05-16 ENCOUNTER — Inpatient Hospital Stay (HOSPITAL_COMMUNITY): Payer: BC Managed Care – PPO

## 2013-05-16 LAB — GLUCOSE, CAPILLARY
Glucose-Capillary: 193 mg/dL — ABNORMAL HIGH (ref 70–99)
Glucose-Capillary: 250 mg/dL — ABNORMAL HIGH (ref 70–99)

## 2013-05-16 LAB — URINALYSIS, ROUTINE W REFLEX MICROSCOPIC
Bilirubin Urine: NEGATIVE
Glucose, UA: >1000 mg/dL — AB
Ketones, ur: NEGATIVE mg/dL
Protein, ur: NEGATIVE mg/dL
Urobilinogen, UA: 1 mg/dL (ref 0.0–1.0)
pH: 6 (ref 5.0–8.0)

## 2013-05-16 LAB — CBC
HCT: 27.3 % — ABNORMAL LOW (ref 36.0–46.0)
Hemoglobin: 9.1 g/dL — ABNORMAL LOW (ref 12.0–15.0)
MCH: 30.5 pg (ref 26.0–34.0)
MCHC: 33.3 g/dL (ref 30.0–36.0)
RBC: 2.98 MIL/uL — ABNORMAL LOW (ref 3.87–5.11)
RDW: 13 % (ref 11.5–15.5)

## 2013-05-16 LAB — URINE MICROSCOPIC-ADD ON

## 2013-05-16 NOTE — Progress Notes (Signed)
Physical Therapy Treatment Patient Details Name: [name] MRN: [id number] DOB: 1960-11-09 Today's Date: 05/16/2013 Time: 7829-5621 PT Time Calculation (min): 28 min  PT Assessment / Plan / Recommendation  History of Present Illness 52 y.o. female admitted to [facility] on 05/14/13 s/p elective L4/5 decompression L5/S1 fusion.     PT Comments   Pt admitted with above. Pt currently with functional limitations due to continued pain and weakness limiting mobility.   Pt will benefit from skilled PT to increase their independence and safety with mobility to allow discharge to the venue listed below.   Follow Up Recommendations  Home health PT;Supervision for mobility/OOB                 Equipment Recommendations  Rolling walker with 5" wheels;3in1 (PT)        Frequency Min 5X/week   Progress towards PT Goals Progress towards PT goals: Progressing toward goals  Plan Current plan remains appropriate    Precautions / Restrictions Precautions Precautions: Back Precaution Booklet Issued: Yes (comment) Precaution Comments: handout given and reviewed Required Braces or Orthoses: Spinal Brace Spinal Brace: Applied in sitting position Restrictions Weight Bearing Restrictions: No   Pertinent Vitals/Pain VSS, 10/10 back pain    Mobility  Bed Mobility Bed Mobility: Not assessed Transfers Transfers: Sit to Stand;Stand to Sit;Stand Pivot Transfers Sit to Stand: 1: +2 Total assist;With upper extremity assist;With armrests;From chair/3-in-1 Sit to Stand: Patient Percentage: 60% Stand to Sit: 1: +2 Total assist;With upper extremity assist;To chair/3-in-1;With armrests Stand to Sit: Patient Percentage: 60% Stand Pivot Transfers: 1: +2 Total assist Stand Pivot Transfers: Patient Percentage: 70% Details for Transfer Assistance: VCs for safe hand placement.  Assist needed to support trunk to get to standing over weak legs.  She had slow transitions due to pain and flexed knees upon first  standing.  Verbal cues to slow breathing as pt was starting to hyperventalate.  "I have anxiety"Pt reports she takes Celexa and Valium at home.  Notified nursing to ensure pt was medicated properly.  Pt c/o left LE weaker after fall last night.  Applied ice to left hip/thigh after session complete.   Ambulation/Gait Ambulation/Gait Assistance: 4: Min assist Ambulation Distance (Feet): 125 Feet Assistive device: Rolling walker Ambulation/Gait Assistance Details: Continues to need min assist to support trunk over weak legs and painful back.  Slow antalgic gait pattern.  Bil knee instability noted with pt needing chair follow.   Gait Pattern: Step-through pattern;Shuffle Gait velocity: decreased Stairs: No Wheelchair Mobility Wheelchair Mobility: No    Exercises General Exercises - Lower Extremity Ankle Circles/Pumps: AROM;Both;10 reps;Seated Long Arc Quad: AROM;10 reps;Both;Seated Hip ABduction/ADduction: AROM;Both;10 reps;Seated Hip Flexion/Marching: AROM;Both;10 reps;Seated   PT Goals (current goals can now be found in the care plan section)    Visit Information  Last PT Received On: 05/16/13 Assistance Needed: +2 (for safety as pt is anxious) History of Present Illness: 52 y.o. female admitted to [facility] on 05/14/13 s/p elective L4/5 decompression L5/S1 fusion.      Subjective Data  Subjective: "I get so anxious and I don't know why."   Cognition  Cognition Arousal/Alertness: Awake/alert Behavior During Therapy: WFL for tasks assessed/performed Overall Cognitive Status: Within Functional Limits for tasks assessed Memory: Decreased recall of precautions    Balance     End of Session PT - End of Session Equipment Utilized During Treatment: Gait belt;Back brace Activity Tolerance: Patient limited by pain Patient left: in chair;with call bell/phone within reach;with family/visitor present Nurse Communication: Mobility status  INGOLD,Rita Prom 05/16/2013, 4:05 PM [facility] Acute Rehabilitation [phone] (pager)

## 2013-05-16 NOTE — Progress Notes (Signed)
Subjective: 2 Days Post-Op Procedure(s) (LRB): TLIF L5-S1 AND RIGHT L4-L5 DECOMPRESSION AND CYST REMOVAL (N/A) RIGHT L4-L5 DECOMPRESSION AND CYST REMOVAL (N/A) Patient reports pain as 6 on 0-10 scale. Fell in [place] last night. Only pain is Left Posterior Pelvis pain. No Groin Pain. Will Xray Left Hip and Pelvis. Dressing changed and wounds look fine.   Objective: Vital signs in last 24 hours: Temp:  [98.9 F (37.2 C)-103 F (39.4 C)] 98.9 F (37.2 C) (10/25 0542) Pulse Rate:  [83-106] 93 (10/25 0542) Resp:  [15-16] 16 (10/25 0542) BP: (97-116)/(50-62) 113/62 mmHg (10/25 0542) SpO2:  [92 %-98 %] 98 % (10/25 0542)  Intake/Output from previous day: 10/24 0701 - 10/25 0700 In: 533 [P.O.:480; I.V.:3; IV Piggyback:50] Out: -  Intake/Output this shift:     Recent Labs  05/15/13 0635  HGB 9.8*    Recent Labs  05/15/13 0635  WBC 8.1  RBC 3.24*  HCT 29.9*  PLT 303    Recent Labs  05/15/13 0635  NA 135  K 4.0  CL 99  CO2 28  BUN 10  CREATININE 0.61  GLUCOSE 221*  CALCIUM 8.2*   No results found for this basename: LABPT, INR,  in the last 72 hours  Dorsiflexion/Plantar flexion intact  Assessment/Plan: 2 Days Post-Op Procedure(s) (LRB): TLIF L5-S1 AND RIGHT L4-L5 DECOMPRESSION AND CYST REMOVAL (N/A) RIGHT L4-L5 DECOMPRESSION AND CYST REMOVAL (N/A) Up with therapy. Xray Pelvis and Left Hip ordered.  Kearston Putman A 05/16/2013, 9:00 AM

## 2013-05-16 NOTE — Progress Notes (Signed)
Pt sustained an unwitnessed fall around 0410. Pt pressed call bell prior to getting out of bed. Secretary and NT notified of call. Pt ambulated to bathroom immediately after pressing call bell due to urinary urgency. Pt stated that she got herself to the commode safely but after voiding she eased herself onto her knees grabbing the side rails. When charge RN walked into the room pt was sitting on the toilet with family at her bedside. Pt did not sustain any injuries, VS and CBG stable. Pt cleaned, new gown placed and assessed by charge rn, primary RN and NT. Pt helped back to the bed VIA 2 assist. SCDs, oxygen and bed alarm placed. Call bell placed within reach. Pt education reinforced to call when needing to ambulate. PA on call notified. No orders received. Will continue to monitor.

## 2013-05-16 NOTE — Progress Notes (Signed)
Pt had temp this AM of 102.4, notified Dr. Darrelyn Hillock, who said to give 650 tylenol, and have pt work on IS.  This evening, pt had fever of 103.1, [name], [name], notified, orders received.  Nsg to continue to monitor.

## 2013-05-16 NOTE — Progress Notes (Signed)
On call provider ordered PRN Tylenol for fever. Order carried out at 2320. Temp was 100.7 at 0032. Temp is in the low 99's. Will continue to monitor.

## 2013-05-17 LAB — CBC
HCT: 26.7 % — ABNORMAL LOW (ref 36.0–46.0)
Hemoglobin: 9.2 g/dL — ABNORMAL LOW (ref 12.0–15.0)
MCH: 31.2 pg (ref 26.0–34.0)
MCHC: 34.5 g/dL (ref 30.0–36.0)
MCV: 90.5 fL (ref 78.0–100.0)
RDW: 13 % (ref 11.5–15.5)

## 2013-05-17 LAB — GLUCOSE, CAPILLARY
Glucose-Capillary: 189 mg/dL — ABNORMAL HIGH (ref 70–99)
Glucose-Capillary: 283 mg/dL — ABNORMAL HIGH (ref 70–99)

## 2013-05-17 MED ORDER — METRONIDAZOLE 500 MG PO TABS
2000.0000 mg | ORAL_TABLET | Freq: Once | ORAL | Status: AC
  Administered 2013-05-17: 2000 mg via ORAL
  Filled 2013-05-17: qty 4

## 2013-05-17 NOTE — Progress Notes (Addendum)
Occupational Therapy Treatment Patient Details Name: [name] MRN: [id number] DOB: May 24, 1961 Today's Date: 05/17/2013 Time: 4098-1191 OT Time Calculation (min): 31 min  OT Assessment / Plan / Recommendation  History of present illness 52 y.o. female admitted to [facility] on 05/14/13 s/p elective L4/5 decompression L5/S1 fusion.     OT comments  Practiced tub transfer and educated/practiced with AE for LB ADLs. Provided education.   Follow Up Recommendations  No OT follow up;Supervision/Assistance - 24 hour    Barriers to Discharge       Equipment Recommendations  3 in 1 bedside comode;Tub/shower seat (needs FLAT shower chair with back)   Recommendations for Other Services    Frequency Min 2X/week   Progress towards OT Goals Progress towards OT goals: Progressing toward goals  Plan Discharge plan needs to be updated    Precautions / Restrictions Precautions Precautions: Back;Fall Precaution Booklet Issued: No Precaution Comments: Reviewed back precautions with pt Required Braces or Orthoses: Spinal Brace Spinal Brace: Applied in sitting position Restrictions Weight Bearing Restrictions: No   Pertinent Vitals/Pain Pain 5/10 in back. Repositioned.     ADL  Upper Body Dressing: Set up;Supervision/safety (back brace) Where Assessed - Upper Body Dressing: Unsupported sitting Lower Body Dressing: Set up;Supervision/safety Where Assessed - Lower Body Dressing: Supported sitting Toilet Transfer: Min Pension scheme manager Method: Sit to Barista: Other (comment);Bedside commode (sit to stand from recliner chair) Tub/Shower Transfer: Performed;Moderate assistance Tub/Shower Transfer Method: Science writer: Shower seat with back Equipment Used: Gait belt;Back brace;Sock aid;Rolling walker;Reacher;Long-handled sponge;Long-handled shoe horn Transfers/Ambulation Related to ADLs: Min guard for ambulation and sit <> stand  transfers to/from recliner chair. Mod A for shower transfer.  ADL Comments: Educated on use of bag on walker to carry items, having family pick up rugs in house (having non skid rug in bathroom). Educated on using two cups for teeth care and having grooming supplies on right side of sink to avoid breaking precautions. Practiced tub transfer-Mod A to get both legs in and out of tub (friend present). Educated on toilet aid for hygiene as well as AE for LB ADLs. Pt practiced donning/doffing sock with reacher and sockaid.   Educated on donning back brace.    OT Diagnosis:    OT Problem List:   OT Treatment Interventions:     OT Goals(current goals can now be found in the care plan section) Acute Rehab OT Goals Patient Stated Goal: not stated OT Goal Formulation: With patient Time For Goal Achievement: 05/22/13 Potential to Achieve Goals: Good ADL Goals Pt Will Perform Lower Body Dressing: with set-up;with supervision;with adaptive equipment;sit to/from stand (or caregiver education on how to A patient) Pt Will Perform Toileting - Clothing Manipulation and hygiene: with supervision;sit to/from stand Pt Will Perform Tub/Shower Transfer: Tub transfer;with supervision;ambulating;shower seat;rolling walker;with set-up Additional ADL Goal #1: Pt will be independent with donning and doffing brace Additional ADL Goal #2: Pt will be able to come up to sit and lay down with S with HOB flat and no rail Additional ADL Goal #3: Pt will be able to state 3/3 back precautions  Visit Information  Last OT Received On: 05/17/13 Assistance Needed: +1 History of Present Illness: 52 y.o. female admitted to [facility] on 05/14/13 s/p elective L4/5 decompression L5/S1 fusion.      Subjective Data      Prior Functioning       Cognition  Cognition Arousal/Alertness: Awake/alert Behavior During Therapy: WFL for tasks assessed/performed Overall Cognitive Status: Within  Functional Limits for tasks assessed     Mobility  Bed Mobility Bed Mobility: Not assessed Transfers Transfers: Sit to Stand;Stand to Sit Sit to Stand: 4: Min guard;With upper extremity assist;From chair/3-in-1 Stand to Sit: 4: Min guard;With upper extremity assist;To chair/3-in-1 Details for Transfer Assistance: Min guard for safety.    Exercises      Balance     End of Session OT - End of Session Equipment Utilized During Treatment: Gait belt;Rolling walker;Back brace Activity Tolerance: Patient tolerated treatment well Patient left: in chair;with family/visitor present;with nursing/sitter in room  GO     Earlie Raveling OTR/L [phone] 05/17/2013, 3:56 PM

## 2013-05-17 NOTE — Progress Notes (Signed)
Subjective: 3 Days Post-Op Procedure(s) (LRB): TLIF L5-S1 AND RIGHT L4-L5 DECOMPRESSION AND CYST REMOVAL (N/A) RIGHT L4-L5 DECOMPRESSION AND CYST REMOVAL (N/A) Patient reports pain as well controlled.  Tolerating meds and PO's. Progressing with PT.  Reports discharge and smell from vagina. No N/V/C. Passing flatus, no BM.  Objective: Vital signs in last 24 hours: Temp:  [99.5 F (37.5 C)-103.1 F (39.5 C)] 99.5 F (37.5 C) (10/26 0556) Pulse Rate:  [56-93] 56 (10/26 0556) Resp:  [16-18] 18 (10/26 0556) BP: (101-120)/(48-61) 103/48 mmHg (10/26 0556) SpO2:  [95 %-100 %] 100 % (10/26 0556)  Intake/Output from previous day: 10/25 0701 - 10/26 0700 In: 240 [P.O.:240] Out: -  Intake/Output this shift:     Recent Labs  05/15/13 0635 05/16/13 2025 05/17/13 0525  HGB 9.8* 9.1* 9.2*    Recent Labs  05/16/13 2025 05/17/13 0525  WBC 13.0* 11.7*  RBC 2.98* 2.95*  HCT 27.3* 26.7*  PLT 282 301    Recent Labs  05/15/13 0635  NA 135  K 4.0  CL 99  CO2 28  BUN 10  CREATININE 0.61  GLUCOSE 221*  CALCIUM 8.2*   No results found for this basename: LABPT, INR,  in the last 72 hours  Alert and oriented x3. Sitting up in the chair. Lumbar dressing C/D/I. Bilateral LE 2+ pulses and sensation in tact. Calf bilateral soft and non tender.  Assessment/Plan: 3 Days Post-Op Procedure(s) (LRB): TLIF L5-S1 AND RIGHT L4-L5 DECOMPRESSION AND CYST REMOVAL (N/A) RIGHT L4-L5 DECOMPRESSION AND CYST REMOVAL (N/A) Continue to work with PT. Fever improved. Left hip sore from fall friday, negative xray Plan D/C home tomorrow if cleared by PT  Trichomonas noted in urine: Will Treat flagyl 2grams x1 Follow with her GYN post D/C home.    Galya Dunnigan L 05/17/2013, 10:33 AM

## 2013-05-18 ENCOUNTER — Encounter (HOSPITAL_COMMUNITY): Payer: Self-pay | Admitting: Orthopedic Surgery

## 2013-05-18 LAB — GLUCOSE, CAPILLARY: Glucose-Capillary: 180 mg/dL — ABNORMAL HIGH (ref 70–99)

## 2013-05-18 LAB — URINE CULTURE: Colony Count: >=100000

## 2013-05-18 MED ORDER — SULFAMETHOXAZOLE-TMP DS 800-160 MG PO TABS
1.0000 | ORAL_TABLET | Freq: Once | ORAL | Status: DC
  Filled 2013-05-18: qty 1

## 2013-05-18 MED ORDER — DOCUSATE SODIUM 100 MG PO CAPS: 100.0000 mg | ORAL_CAPSULE | Freq: Three times a day (TID) | ORAL | Status: DC | PRN

## 2013-05-18 MED ORDER — SULFAMETHOXAZOLE-TMP DS 800-160 MG PO TABS: 1.0000 | ORAL_TABLET | Freq: Two times a day (BID) | ORAL | Status: DC

## 2013-05-18 NOTE — Progress Notes (Signed)
Patient Temp of 101.1. Incentive Spirometer encouraged and 650mg  PO Tylenol given. Will reassess temperature and continue to monitor.

## 2013-05-18 NOTE — Progress Notes (Signed)
Subjective: Doing well.  Pain controlled.  Had a fall but doing well.  Bumped chin.  No increased back pain.    Objective: Vital signs in last 24 hours: Temp:  [99.3 F (37.4 C)-101.4 F (38.6 C)] 101.1 F (38.4 C) (10/27 1413) Pulse Rate:  [72-85] 72 (10/27 1413) Resp:  [18] 18 (10/27 1413) BP: (108-128)/(56-69) 127/69 mmHg (10/27 1413) SpO2:  [100 %] 100 % (10/27 1413)  Intake/Output from previous day: 10/26 0701 - 10/27 0700 In: 720 [P.O.:720] Out: -  Intake/Output this shift: Total I/O In: 480 [P.O.:480] Out: -    Recent Labs  05/16/13 2025 05/17/13 0525  HGB 9.1* 9.2*    Recent Labs  05/16/13 2025 05/17/13 0525  WBC 13.0* 11.7*  RBC 2.98* 2.95*  HCT 27.3* 26.7*  PLT 282 301   No results found for this basename: NA, K, CL, CO2, BUN, CREATININE, GLUCOSE, CALCIUM,  in the last 72 hours No results found for this basename: LABPT, INR,  in the last 72 hours  Exam:  Wound looks good.  steris intact.  No drainage or signs of infection.  Neurologically intact.   Assessment/Plan: D/c home today.  F/u with dr Shon Baton 2 weeks postop.  Return sooner if needed. UTI-  Pos lactobacillus.  Will treat with bactrim DS po bid x 1 week.  Will need f/u with primary care.    [name] M 05/18/2013, 3:04 PM

## 2013-05-18 NOTE — Progress Notes (Signed)
Physical Therapy Treatment Patient Details Name: [name] MRN: [id number] DOB: [birth year] Today's Date: 05/18/2013 Time: 1203-1229 PT Time Calculation (min): 26 min  PT Assessment / Plan / Recommendation  History of Present Illness 52 y.o. female admitted to [facility] on 05/14/13 s/p elective L4/5 decompression L5/S1 fusion.     PT Comments   Pt is POD #4 s/p lumbar surgery and is moving better today with less assistance.  She and her friend who is helping her home were able to demonstrate safety with stairs.  She is physically ready for discharge and would benefit from HHPT at discharge.    Follow Up Recommendations  Home health PT;Supervision for mobility/OOB     Does the patient have the potential to tolerate intense rehabilitation    NA  Barriers to Discharge   None      Equipment Recommendations  Rolling walker with 5" wheels;3in1 (PT) (delivered to room today)    Recommendations for Other Services   None  Frequency Min 5X/week   Progress towards PT Goals Progress towards PT goals: Progressing toward goals  Plan Current plan remains appropriate    Precautions / Restrictions Precautions Precautions: Back;Fall Precaution Comments: Pt was able to recall all back precautions, lifting restrictions.   Required Braces or Orthoses: Spinal Brace Spinal Brace: Applied in sitting position Restrictions Weight Bearing Restrictions: No   Pertinent Vitals/Pain See vitals flow sheet.    Mobility  Bed Mobility Bed Mobility: Not assessed (pt OOB being assisted by RN staff to bathroom.  ) Transfers Transfers: Sit to Stand;Stand to Sit Sit to Stand: 4: Min assist;With upper extremity assist;With armrests;From chair/3-in-1 Stand to Sit: 4: Min assist;With upper extremity assist;With armrests;To chair/3-in-1 Details for Transfer Assistance: min assist to help support trunk over weak legs.  Verbal cues for hand placement and safe use of RW with  transitions Ambulation/Gait Ambulation/Gait Assistance: 4: Min assist Ambulation Distance (Feet): 35 Feet Assistive device: Rolling walker Ambulation/Gait Assistance Details: min assist to support truk over weak legs.  Except with transitions, there is no signs of leg buckling with gait.   Gait Pattern: Step-through pattern;Shuffle Gait velocity: decreased General Gait Details: guarded due to pain Stairs: Yes Stairs Assistance: 4: Min assist Stairs Assistance Details (indicate cue type and reason): min assist to help stabilize RW, second person used for safety, but did not provide assistance.  Pt has two people helping her in the house at discharge.   Stair Management Technique: Backwards;No rails;With walker Number of Stairs: 2 (practed two times with PT tech first, then friend )      PT Goals (current goals can now be found in the care plan section) Acute Rehab PT Goals Patient Stated Goal: to go home today if she can  Visit Information  Last PT Received On: 05/18/13 Assistance Needed: +1 History of Present Illness: 52 y.o. female admitted to [facility] on 05/14/13 s/p elective L4/5 decompression L5/S1 fusion.      Subjective Data  Subjective: Pt reports her right leg is still weak and painful Patient Stated Goal: to go home today if she can   Cognition  Cognition Arousal/Alertness: Awake/alert Behavior During Therapy: WFL for tasks assessed/performed Overall Cognitive Status: Within Functional Limits for tasks assessed       End of Session PT - End of Session Equipment Utilized During Treatment: Gait belt;Back brace Activity Tolerance: Patient limited by fatigue;Patient limited by pain Patient left: in chair;with call bell/phone within reach     Homeland B. Larayne Baxley, PT, DPT [phone]  05/18/2013, 12:38 PM

## 2013-05-18 NOTE — Progress Notes (Signed)
Patient provided with discharge instructions and follow up information. She is going home with HHPT and has no further needs at this time.

## 2013-05-21 ENCOUNTER — Ambulatory Visit (INDEPENDENT_AMBULATORY_CARE_PROVIDER_SITE_OTHER): Payer: BC Managed Care – PPO | Admitting: Obstetrics & Gynecology

## 2013-05-21 ENCOUNTER — Encounter: Payer: Self-pay | Admitting: Obstetrics & Gynecology

## 2013-05-21 ENCOUNTER — Other Ambulatory Visit (HOSPITAL_COMMUNITY)
Admission: RE | Admit: 2013-05-21 | Discharge: 2013-05-21 | Disposition: A | Discharge status: Home / Self Care | Payer: BC Managed Care – PPO | Source: Ambulatory Visit | Attending: Obstetrics & Gynecology | Admitting: Obstetrics & Gynecology

## 2013-05-21 VITALS — BP 136/80 | Ht 66.0 in | Wt 192.0 lb

## 2013-05-21 DIAGNOSIS — Z1212 Encounter for screening for malignant neoplasm of rectum: Secondary | ICD-10-CM

## 2013-05-21 DIAGNOSIS — Z01419 Encounter for gynecological examination (general) (routine) without abnormal findings: Secondary | ICD-10-CM | POA: Insufficient documentation

## 2013-05-21 DIAGNOSIS — Z1151 Encounter for screening for human papillomavirus (HPV): Secondary | ICD-10-CM | POA: Insufficient documentation

## 2013-05-21 LAB — HEMOCCULT GUIAC POC 1CARD (OFFICE): Fecal Occult Blood, POC: NEGATIVE

## 2013-05-21 MED ORDER — TERCONAZOLE 0.4 % VA CREA: 1.0000 | TOPICAL_CREAM | Freq: Every day | VAGINAL | Status: DC

## 2013-05-21 NOTE — Progress Notes (Signed)
Patient ID: [name], female   DOB: [birth year], 52 y.o.   MRN: [id number] Subjective:     [name] is a 52 y.o. female here for a routine exam.  No LMP recorded. Patient has had an ablation. [id number] Current complaints: none, had spine surgery last week.    Gynecologic History No LMP recorded. Patient has had an ablation. Contraception: none Last Pap: 2013. Results were: normal Last mammogram: 09/2010. Results were: normal  Past Medical History  Diagnosis Date  . Diabetes mellitus   . Depression   . Constipation   . Fibromyalgia   . Anemia   . Anxiety     Past Surgical History  Procedure Laterality Date  . Knee surgery    . Cystectomy    . Shoulder surgery      right  . Endometrial ablation    . Colonoscopy  02/26/2012    SLF: Internal hemorrhoids/TCS IN 10 YEARS WITH PROPOFOL  . Lumbar laminectomy/decompression microdiscectomy N/A 05/14/2013    Procedure: RIGHT L4-L5 DECOMPRESSION AND CYST REMOVAL;  Surgeon: Venita Lick, MD;  Location: [facility] OR;  Service: Orthopedics;  Laterality: N/A;    OB History   Grav Para Term Preterm Abortions TAB SAB Ect Mult Living   5 4 4  1  1   4       History   Social History  . Marital Status: Divorced    Spouse Name: N/A    Number of Children: N/A  . Years of Education: N/A   Social History Main Topics  . Smoking status: Former Smoker -- 0.50 packs/day for 15 years    Types: Cigarettes    Quit date: 04/29/1991  . Smokeless tobacco: Never Used  . Alcohol Use: 0.6 oz/week    1 Glasses of wine per week     Comment: occ wine; not now  . Drug Use: No     Comment: crack/cocaine 20 yrs ago  . Sexual Activity: No   Other Topics Concern  . None   Social History Narrative  . None    Family History  Problem Relation Age of Onset  . Hypertension Mother   . Cancer Mother   . Asthma Sister   . Asthma Brother   . Stomach cancer      distant relative on dad's side of family  . Colon cancer Neg Hx       Review of Systems  Review of Systems  Constitutional: Negative for fever, chills, weight loss, malaise/fatigue and diaphoresis.  HENT: Negative for hearing loss, ear pain, nosebleeds, congestion, sore throat, neck pain, tinnitus and ear discharge.   Eyes: Negative for blurred vision, double vision, photophobia, pain, discharge and redness.  Respiratory: Negative for cough, hemoptysis, sputum production, shortness of breath, wheezing and stridor.   Cardiovascular: Negative for chest pain, palpitations, orthopnea, claudication, leg swelling and PND.  Gastrointestinal: negative for abdominal pain. Negative for heartburn, nausea, vomiting, diarrhea, constipation, blood in stool and melena.  Genitourinary: Negative for dysuria, urgency, frequency, hematuria and flank pain.  Musculoskeletal: Negative for myalgias, back pain, joint pain and falls.  Skin: Negative for itching and rash.  Neurological: Negative for dizziness, tingling, tremors, sensory change, speech change, focal weakness, seizures, loss of consciousness, weakness and headaches.  Endo/Heme/Allergies: Negative for environmental allergies and polydipsia. Does not bruise/bleed easily.  Psychiatric/Behavioral: Negative for depression, suicidal ideas, hallucinations, memory loss and substance abuse. The patient is not nervous/anxious and does not have insomnia.        Objective:  Physical Exam  Vitals reviewed. Constitutional: She is oriented to person, place, and time. She appears well-developed and well-nourished.  HENT:  Head: Normocephalic and atraumatic.        Right Ear: External ear normal.  Left Ear: External ear normal.  Nose: Nose normal.  Mouth/Throat: Oropharynx is clear and moist.  Eyes: Conjunctivae and EOM are normal. Pupils are equal, round, and reactive to light. Right eye exhibits no discharge. Left eye exhibits no discharge. No scleral icterus.  Neck: Normal range of motion. Neck supple. No tracheal  deviation present. No thyromegaly present.  Cardiovascular: Normal rate, regular rhythm, normal heart sounds and intact distal pulses.  Exam reveals no gallop and no friction rub.   No murmur heard. Respiratory: Effort normal and breath sounds normal. No respiratory distress. She has no wheezes. She has no rales. She exhibits no tenderness.  GI: Soft. Bowel sounds are normal. She exhibits no distension and no mass. There is no tenderness. There is no rebound and no guarding.  Genitourinary:  Breasts no masses skin changes or nipple changes bilaterally      Vulva is normal without lesions Vagina is pink moist without discharge Cervix normal in appearance and pap is done Uterus is normal size shape and contour Adnexa is negative with normal sized ovaries  Rectal    hemoccult negative, normal tone, no masses  Musculoskeletal: Normal range of motion. She exhibits no edema and no tenderness.  Neurological: She is alert and oriented to person, place, and time. She has normal reflexes. She displays normal reflexes. No cranial nerve deficit. She exhibits normal muscle tone. Coordination normal.  Skin: Skin is warm and dry. No rash noted. No erythema. No pallor.  Psychiatric: She has a normal mood and affect. Her behavior is normal. Judgment and thought content normal.       Assessment:    Healthy female exam.    Plan:    Mammogram ordered. Follow up in: 1 year.   Yeast  terazol 7 Has not had mammogram since 09/2010

## 2013-06-11 ENCOUNTER — Emergency Department (HOSPITAL_COMMUNITY)
Admission: EM | Admit: 2013-06-11 | Discharge: 2013-06-11 | Disposition: A | Discharge status: Home / Self Care | Payer: BC Managed Care – PPO | Attending: Emergency Medicine | Admitting: Emergency Medicine

## 2013-06-11 ENCOUNTER — Encounter (HOSPITAL_COMMUNITY): Payer: Self-pay | Admitting: Emergency Medicine

## 2013-06-11 DIAGNOSIS — R51 Headache: Secondary | ICD-10-CM | POA: Insufficient documentation

## 2013-06-11 DIAGNOSIS — F329 Major depressive disorder, single episode, unspecified: Secondary | ICD-10-CM | POA: Insufficient documentation

## 2013-06-11 DIAGNOSIS — Z8739 Personal history of other diseases of the musculoskeletal system and connective tissue: Secondary | ICD-10-CM | POA: Insufficient documentation

## 2013-06-11 DIAGNOSIS — IMO0002 Reserved for concepts with insufficient information to code with codable children: Secondary | ICD-10-CM | POA: Insufficient documentation

## 2013-06-11 DIAGNOSIS — Z87891 Personal history of nicotine dependence: Secondary | ICD-10-CM | POA: Insufficient documentation

## 2013-06-11 DIAGNOSIS — I10 Essential (primary) hypertension: Secondary | ICD-10-CM | POA: Insufficient documentation

## 2013-06-11 DIAGNOSIS — I160 Hypertensive urgency: Secondary | ICD-10-CM

## 2013-06-11 DIAGNOSIS — Z862 Personal history of diseases of the blood and blood-forming organs and certain disorders involving the immune mechanism: Secondary | ICD-10-CM | POA: Insufficient documentation

## 2013-06-11 DIAGNOSIS — Z79899 Other long term (current) drug therapy: Secondary | ICD-10-CM | POA: Insufficient documentation

## 2013-06-11 DIAGNOSIS — F3289 Other specified depressive episodes: Secondary | ICD-10-CM | POA: Insufficient documentation

## 2013-06-11 DIAGNOSIS — F411 Generalized anxiety disorder: Secondary | ICD-10-CM | POA: Insufficient documentation

## 2013-06-11 DIAGNOSIS — E119 Type 2 diabetes mellitus without complications: Secondary | ICD-10-CM | POA: Insufficient documentation

## 2013-06-11 DIAGNOSIS — Z794 Long term (current) use of insulin: Secondary | ICD-10-CM | POA: Insufficient documentation

## 2013-06-11 DIAGNOSIS — Z8719 Personal history of other diseases of the digestive system: Secondary | ICD-10-CM | POA: Insufficient documentation

## 2013-06-11 DIAGNOSIS — Z7982 Long term (current) use of aspirin: Secondary | ICD-10-CM | POA: Insufficient documentation

## 2013-06-11 LAB — BASIC METABOLIC PANEL
CO2: 30 mEq/L (ref 19–32)
Calcium: 9.5 mg/dL (ref 8.4–10.5)
Chloride: 102 mEq/L (ref 96–112)
GFR calc non Af Amer: >90 mL/min (ref >90)
Glucose, Bld: 150 mg/dL — ABNORMAL HIGH (ref 70–99)
Potassium: 4.3 mEq/L (ref 3.5–5.1)
Sodium: 139 mEq/L (ref 135–145)

## 2013-06-11 LAB — CBC WITH DIFFERENTIAL/PLATELET
Basophils Relative: 0 % (ref 0–1)
Eosinophils Absolute: 0.1 10*3/uL (ref 0.0–0.7)
Eosinophils Relative: 2 % (ref 0–5)
HCT: 32.2 % — ABNORMAL LOW (ref 36.0–46.0)
Hemoglobin: 10.2 g/dL — ABNORMAL LOW (ref 12.0–15.0)
Lymphocytes Relative: 20 % (ref 12–46)
Lymphs Abs: 0.9 10*3/uL (ref 0.7–4.0)
MCH: 29.8 pg (ref 26.0–34.0)
MCHC: 31.7 g/dL (ref 30.0–36.0)
MCV: 94.2 fL (ref 78.0–100.0)
Monocytes Absolute: 0.5 10*3/uL (ref 0.1–1.0)
Monocytes Relative: 10 % (ref 3–12)
Platelets: 524 10*3/uL — ABNORMAL HIGH (ref 150–400)
RBC: 3.42 MIL/uL — ABNORMAL LOW (ref 3.87–5.11)

## 2013-06-11 LAB — URINALYSIS, ROUTINE W REFLEX MICROSCOPIC
Bilirubin Urine: NEGATIVE
Glucose, UA: NEGATIVE mg/dL
Ketones, ur: NEGATIVE mg/dL
Nitrite: NEGATIVE
Specific Gravity, Urine: 1.01 (ref 1.005–1.030)
pH: 6.5 (ref 5.0–8.0)

## 2013-06-11 MED ORDER — LOSARTAN POTASSIUM 100 MG PO TABS: 100.0000 mg | ORAL_TABLET | Freq: Every day | ORAL | Status: DC

## 2013-06-11 MED ORDER — DIPHENHYDRAMINE HCL 50 MG/ML IJ SOLN
25.0000 mg | Freq: Once | INTRAMUSCULAR | Status: AC
  Administered 2013-06-11: 25 mg via INTRAVENOUS
  Filled 2013-06-11: qty 1

## 2013-06-11 MED ORDER — METOCLOPRAMIDE HCL 10 MG PO TABS: 10.0000 mg | ORAL_TABLET | Freq: Three times a day (TID) | ORAL | Status: DC | PRN

## 2013-06-11 MED ORDER — METOCLOPRAMIDE HCL 5 MG/ML IJ SOLN
10.0000 mg | Freq: Once | INTRAMUSCULAR | Status: AC
  Administered 2013-06-11: 10 mg via INTRAVENOUS
  Filled 2013-06-11: qty 2

## 2013-06-11 MED ORDER — SODIUM CHLORIDE 0.9 % IV BOLUS (SEPSIS)
1000.0000 mL | Freq: Once | INTRAVENOUS | Status: AC
  Administered 2013-06-11: 1000 mL via INTRAVENOUS

## 2013-06-11 NOTE — ED Notes (Signed)
Pt asleep in bed, normal rise and fall of chest. Friend stated that pt was feeling much better, warm blanket given to friend who is at bedside.

## 2013-06-11 NOTE — ED Notes (Signed)
Pt states her blood pressure has been elevated for a week, states she has been having bad headaches with same.

## 2013-06-11 NOTE — ED Notes (Signed)
Pt sleeping at present, normal rise and fall of chest noted.

## 2013-06-11 NOTE — ED Notes (Signed)
Iv started and meds given as ordered by md, pt given warm blanket, cont. To complain of headache. Friend at bedside of the pt.

## 2013-06-11 NOTE — ED Notes (Signed)
Awoke around 6:00 am due to feeling like blood sugar was low - shaky and mild sweating  Checked glucose around 52- ate a bowl of cereal - noted headache - all across forehead.  Checked BP with wrist cuff - states reading 200/100 or close to that a couple of time.  Came to ED due to high bO and headache.  Does not usually have headaches.  Denies any recent illness.  Back surgery a month ago- wearing back brace

## 2013-06-11 NOTE — ED Provider Notes (Signed)
CSN: [id number]     Arrival date & time 06/11/13  2440 History  This chart was scribed for Gerhard Munch, MD by Quintella Reichert, ED scribe.  This patient was seen in room APA09/APA09 and the patient's care was started at 7:13 AM.   Chief Complaint  Patient presents with  . Headache  . Hypertension    The history is provided by the patient. No language interpreter was used.    HPI Comments: [name] is a 52 y.o. female with h/o DM who presents to the Emergency Department complaining of 2 weeks of persistent, progressively-worsening HTN with associated headaches.  Pt states that for the past 2 weeks she has had severe headaches and has checked her BP each time that they have recurred.  Over that time period her BP has risen from 170/101 to 200/110 this morning.  She also notes 2 days of left arm pain at the onset of her symptoms.  This has since resolved.  She states her vision has also been worsening over the past 2 weeks although she states this may be normal aging for her.  She denies focal weakness, speech difficulties, or any other associated symptoms.  Pt denies prior h/o similar headaches and has never been diagnosed with HTN.  She last saw her PCP on 10/28 and she states that her blood pressure was normal at that time.  She takes hydrochlotothiazide 25mg  daily for her DM.  PCP: Colette Ribas, MD    Past Medical History  Diagnosis Date  . Diabetes mellitus   . Depression   . Constipation   . Fibromyalgia   . Anemia   . Anxiety     Past Surgical History  Procedure Laterality Date  . Knee surgery    . Cystectomy    . Shoulder surgery      right  . Endometrial ablation    . Colonoscopy  02/26/2012    SLF: Internal hemorrhoids/TCS IN 10 YEARS WITH PROPOFOL  . Lumbar laminectomy/decompression microdiscectomy N/A 05/14/2013    Procedure: RIGHT L4-L5 DECOMPRESSION AND CYST REMOVAL;  Surgeon: Venita Lick, MD;  Location: [facility] OR;  Service: Orthopedics;   Laterality: N/A;    Family History  Problem Relation Age of Onset  . Hypertension Mother   . Cancer Mother   . Asthma Sister   . Asthma Brother   . Stomach cancer      distant relative on dad's side of family  . Colon cancer Neg Hx     History  Substance Use Topics  . Smoking status: Former Smoker -- 0.50 packs/day for 15 years    Types: Cigarettes    Quit date: 04/29/1991  . Smokeless tobacco: Never Used  . Alcohol Use: No     Comment: occ wine; not now    OB History   Grav Para Term Preterm Abortions TAB SAB Ect Mult Living   5 4 4  1  1   4       Review of Systems  Constitutional:       Per HPI, otherwise negative  HENT:       Per HPI, otherwise negative  Respiratory:       Per HPI, otherwise negative  Cardiovascular:       Per HPI, otherwise negative  Endocrine:       Negative aside from HPI  Genitourinary:       Neg aside from HPI   Musculoskeletal:       Per HPI, otherwise negative  Skin: Negative.   Neurological: Negative for syncope.     Allergies  Review of patient's allergies indicates no known allergies.  Home Medications   Current Outpatient Rx  Name  Route  Sig  Dispense  Refill  . oxyCODONE-acetaminophen (PERCOCET) 10-325 MG per tablet   Oral   Take 1 tablet by mouth every 4 (four) hours as needed for pain.   60 tablet   0   . aspirin EC 81 MG tablet   Oral   Take 81 mg by mouth daily.          Marland Kitchen atorvastatin (LIPITOR) 40 MG tablet   Oral   Take 40 mg by mouth daily.          . citalopram (CELEXA) 20 MG tablet   Oral   Take 20 mg by mouth daily.          Marland Kitchen docusate sodium (COLACE) 100 MG capsule   Oral   Take 1 capsule (100 mg total) by mouth 3 (three) times daily as needed for constipation.   30 capsule   0   . fexofenadine (ALLEGRA) 180 MG tablet   Oral   Take 180 mg by mouth daily.          . fluticasone (FLONASE) 50 MCG/ACT nasal spray   Nasal   Place 2 sprays into the nose 2 (two) times daily.           Marland Kitchen gabapentin (NEURONTIN) 300 MG capsule   Oral   Take 300 mg by mouth 3 (three) times daily.          . hydrochlorothiazide (HYDRODIURIL) 25 MG tablet   Oral   Take 25 mg by mouth daily.          . insulin glargine (LANTUS) 100 UNIT/ML injection   Subcutaneous   Inject 60 Units into the skin at bedtime. For BS >200--sliding scale         . insulin lispro protamine-lispro (HUMALOG 75/25) (75-25) 100 UNIT/ML SUSP   Subcutaneous   Inject 20-30 Units into the skin 3 (three) times daily with meals. Sliding scale         . losartan (COZAAR) 50 MG tablet   Oral   Take 50 mg by mouth daily.          . methocarbamol (ROBAXIN) 500 MG tablet   Oral   Take 1 tablet (500 mg total) by mouth 3 (three) times daily as needed.   60 tablet   0   . ondansetron (ZOFRAN) 4 MG tablet   Oral   Take 1 tablet (4 mg total) by mouth every 8 (eight) hours as needed for nausea.   20 tablet   0   . polyethylene glycol powder (GLYCOLAX) powder   Oral   Take 17 g by mouth daily.   255 g   1   . potassium chloride (K-DUR,KLOR-CON) 10 MEQ tablet   Oral   Take 10 mEq by mouth daily.         Marland Kitchen sulfamethoxazole-trimethoprim (BACTRIM DS) 800-160 MG per tablet   Oral   Take 1 tablet by mouth 2 (two) times daily.   14 tablet   0   . terconazole (TERAZOL 7) 0.4 % vaginal cream   Vaginal   Place 1 applicator vaginally at bedtime.   45 g   0   . traZODone (DESYREL) 100 MG tablet   Oral   Take 100 mg by mouth at bedtime.  BP 167/86  Pulse 81  Temp(Src) 97.7 F (36.5 C) (Oral)  Resp 15  Ht 5' 5.5" (1.664 m)  Wt 189 lb (85.73 kg)  BMI 30.96 kg/m2  SpO2 98%  Physical Exam  Nursing note and vitals reviewed. Constitutional: She is oriented to person, place, and time. She appears well-developed and well-nourished. No distress.  HENT:  Head: Normocephalic and atraumatic.  Eyes: Conjunctivae and EOM are normal. Pupils are equal, round, and reactive to light. Right eye  exhibits no nystagmus. Left eye exhibits no nystagmus.  Cardiovascular: Normal rate, regular rhythm and normal heart sounds.   No murmur heard. Pulmonary/Chest: Effort normal and breath sounds normal. No stridor. No respiratory distress. She has no wheezes. She has no rales.  Abdominal: She exhibits no distension.  Musculoskeletal: She exhibits no edema.  Neurological: She is alert and oriented to person, place, and time. No cranial nerve deficit.  No cerebellar deficits Lower extremity strength 4/5 bilaterally and symmetric, appropriate for surgical history  Skin: Skin is warm and dry.  Psychiatric: She has a normal mood and affect.    ED Course  Procedures (including critical care time)   COORDINATION OF CARE: 7:21 AM-Discussed treatment plan which includes labs with pt at bedside and pt agreed to plan.    Labs Review Labs Reviewed  BASIC METABOLIC PANEL - Abnormal; Notable for the following:    Glucose, Bld 150 (*)    All other components within normal limits  CBC WITH DIFFERENTIAL - Abnormal; Notable for the following:    RBC 3.42 (*)    Hemoglobin 10.2 (*)    HCT 32.2 (*)    Platelets 524 (*)    All other components within normal limits  URINALYSIS, ROUTINE W REFLEX MICROSCOPIC    Imaging Review No results found.   EKG Interpretation    Date/Time:  Thursday June 11 2013 07:53:43 EST Ventricular Rate:  75 PR Interval:  212 QRS Duration: 90 QT Interval:  402 QTC Calculation: 448 R Axis:   70 Text Interpretation:  Sinus rhythm with 1st degree A-V block Cannot rule out Anterior infarct (cited on or before 11-Jun-2013) Abnormal ECG When compared with ECG of 11-May-2013 09:52, Questionable change in QRS axis Abnormal ekg Confirmed by Gerhard Munch  MD ([id number]) on 06/11/2013 8:44:22 AM           On repeat exam the patient states that she feels substantially better, with no headache.  Vital signs have normalized.  Lengthy conversation about medication  alterations, provisional diagnosis, the need for primary care followup next week to discuss both the medication and further evaluation and management.    MDM  No diagnosis found. The patient presents with concern of hypertension and headache.  On initial exam the patient is hypertensive, with headache, but is neurologically appropriate, in no distress.  Patient's vital signs, and subjective condition improved substantially here, with no evidence of decompensation or imminent change in her condition.  After lengthy conversations with the patient and a colleague about having 1 blood pressure medication to her regimen, patient was discharged in stable condition to     I personally performed the services described in this documentation, which was scribed in my presence. The recorded information has been reviewed and is accurate.      Gerhard Munch, MD 06/11/13 [phone]

## 2013-07-03 ENCOUNTER — Telehealth: Payer: Self-pay

## 2013-07-03 MED ORDER — METRONIDAZOLE 500 MG PO TABS: 500.0000 mg | ORAL_TABLET | Freq: Two times a day (BID) | ORAL | Status: DC

## 2013-07-03 MED ORDER — FLUCONAZOLE 150 MG PO TABS: 150.0000 mg | ORAL_TABLET | Freq: Once | ORAL | Status: DC

## 2013-07-03 NOTE — Telephone Encounter (Signed)
PT Call.Want RX On METRONIDEZOLE.C/C ITCHING.Also want RX ON DIFLUCAN.

## 2013-07-03 NOTE — Telephone Encounter (Signed)
Left message for pt. RX has been sent to her pharmacy.

## 2013-07-03 NOTE — Addendum Note (Signed)
Addended by: Lazaro Arms on: 07/03/2013 12:24 PM   Modules accepted: Orders

## 2013-07-07 ENCOUNTER — Telehealth (HOSPITAL_COMMUNITY): Payer: Self-pay | Admitting: Dietician

## 2013-07-07 NOTE — Telephone Encounter (Signed)
Appointment scheduled for 07/27/12 at 1400.

## 2013-07-07 NOTE — Telephone Encounter (Signed)
Faxed appointment date and to [facility] per request. Attempted to call at [phone] for confirmation, but office is closed.

## 2013-07-07 NOTE — Telephone Encounter (Signed)
Received message from [place] at [facility] on 07/06/13 at 1637. Pt needs appointment for diabetes education. Requesting appointment at least two weeks out.

## 2013-07-27 ENCOUNTER — Telehealth (HOSPITAL_COMMUNITY): Payer: Self-pay | Admitting: Dietician

## 2013-07-27 NOTE — Telephone Encounter (Signed)
Pt was a no-show for appointment scheduled for 07/27/2013 at 1400. Sent letter to pt home notifying pt of no-show and requesting rescheduling appointment.

## 2013-08-26 ENCOUNTER — Emergency Department (HOSPITAL_COMMUNITY)
Admission: EM | Admit: 2013-08-26 | Discharge: 2013-08-26 | Disposition: A | Discharge status: Home / Self Care | Payer: BC Managed Care – PPO | Attending: Emergency Medicine | Admitting: Emergency Medicine

## 2013-08-26 ENCOUNTER — Encounter (HOSPITAL_COMMUNITY): Payer: Self-pay | Admitting: Emergency Medicine

## 2013-08-26 DIAGNOSIS — Z794 Long term (current) use of insulin: Secondary | ICD-10-CM | POA: Insufficient documentation

## 2013-08-26 DIAGNOSIS — R739 Hyperglycemia, unspecified: Secondary | ICD-10-CM

## 2013-08-26 DIAGNOSIS — Z87891 Personal history of nicotine dependence: Secondary | ICD-10-CM | POA: Insufficient documentation

## 2013-08-26 DIAGNOSIS — R51 Headache: Secondary | ICD-10-CM | POA: Insufficient documentation

## 2013-08-26 DIAGNOSIS — Z792 Long term (current) use of antibiotics: Secondary | ICD-10-CM | POA: Insufficient documentation

## 2013-08-26 DIAGNOSIS — Z862 Personal history of diseases of the blood and blood-forming organs and certain disorders involving the immune mechanism: Secondary | ICD-10-CM | POA: Insufficient documentation

## 2013-08-26 DIAGNOSIS — Z7982 Long term (current) use of aspirin: Secondary | ICD-10-CM | POA: Insufficient documentation

## 2013-08-26 DIAGNOSIS — F3289 Other specified depressive episodes: Secondary | ICD-10-CM | POA: Insufficient documentation

## 2013-08-26 DIAGNOSIS — Z79899 Other long term (current) drug therapy: Secondary | ICD-10-CM | POA: Insufficient documentation

## 2013-08-26 DIAGNOSIS — K5900 Constipation, unspecified: Secondary | ICD-10-CM | POA: Insufficient documentation

## 2013-08-26 DIAGNOSIS — J3489 Other specified disorders of nose and nasal sinuses: Secondary | ICD-10-CM | POA: Insufficient documentation

## 2013-08-26 DIAGNOSIS — F329 Major depressive disorder, single episode, unspecified: Secondary | ICD-10-CM | POA: Insufficient documentation

## 2013-08-26 DIAGNOSIS — E119 Type 2 diabetes mellitus without complications: Secondary | ICD-10-CM | POA: Insufficient documentation

## 2013-08-26 DIAGNOSIS — IMO0001 Reserved for inherently not codable concepts without codable children: Secondary | ICD-10-CM | POA: Insufficient documentation

## 2013-08-26 DIAGNOSIS — F411 Generalized anxiety disorder: Secondary | ICD-10-CM | POA: Insufficient documentation

## 2013-08-26 LAB — URINALYSIS, ROUTINE W REFLEX MICROSCOPIC
Bilirubin Urine: NEGATIVE
Glucose, UA: >1000 mg/dL — AB
Hgb urine dipstick: NEGATIVE
KETONES UR: 15 mg/dL — AB
LEUKOCYTES UA: NEGATIVE
NITRITE: NEGATIVE
PROTEIN: NEGATIVE mg/dL
Specific Gravity, Urine: 1.02 (ref 1.005–1.030)
UROBILINOGEN UA: 0.2 mg/dL (ref 0.0–1.0)
pH: 5.5 (ref 5.0–8.0)

## 2013-08-26 LAB — CBC WITH DIFFERENTIAL/PLATELET
BASOS ABS: 0 10*3/uL (ref 0.0–0.1)
BASOS PCT: 0 % (ref 0–1)
EOS PCT: 0 % (ref 0–5)
Eosinophils Absolute: 0 10*3/uL (ref 0.0–0.7)
HCT: 39.3 % (ref 36.0–46.0)
Hemoglobin: 12.8 g/dL (ref 12.0–15.0)
Lymphocytes Relative: 16 % (ref 12–46)
Lymphs Abs: 1.4 10*3/uL (ref 0.7–4.0)
MCH: 29.1 pg (ref 26.0–34.0)
MCHC: 32.6 g/dL (ref 30.0–36.0)
MCV: 89.3 fL (ref 78.0–100.0)
MONO ABS: 0.5 10*3/uL (ref 0.1–1.0)
Monocytes Relative: 5 % (ref 3–12)
Neutro Abs: 7.1 10*3/uL (ref 1.7–7.7)
Neutrophils Relative %: 79 % — ABNORMAL HIGH (ref 43–77)
Platelets: 466 10*3/uL — ABNORMAL HIGH (ref 150–400)
RBC: 4.4 MIL/uL (ref 3.87–5.11)
RDW: 13.5 % (ref 11.5–15.5)
WBC: 9 10*3/uL (ref 4.0–10.5)

## 2013-08-26 LAB — BASIC METABOLIC PANEL
BUN: 22 mg/dL (ref 6–23)
BUN: 18 mg/dL (ref 6–23)
CALCIUM: 9.4 mg/dL (ref 8.4–10.5)
CHLORIDE: 104 meq/L (ref 96–112)
CO2: 21 mEq/L (ref 19–32)
CO2: 24 meq/L (ref 19–32)
CREATININE: 0.77 mg/dL (ref 0.50–1.10)
CREATININE: 0.69 mg/dL (ref 0.50–1.10)
Calcium: 8.4 mg/dL (ref 8.4–10.5)
Chloride: 96 mEq/L (ref 96–112)
GFR calc Af Amer: >90 mL/min (ref >90)
GFR calc non Af Amer: >90 mL/min (ref >90)
Glucose, Bld: 482 mg/dL — ABNORMAL HIGH (ref 70–99)
Glucose, Bld: 238 mg/dL — ABNORMAL HIGH (ref 70–99)
Potassium: 4.7 mEq/L (ref 3.7–5.3)
Potassium: 4.3 mEq/L (ref 3.7–5.3)
SODIUM: 140 meq/L (ref 137–147)
Sodium: 135 mEq/L — ABNORMAL LOW (ref 137–147)

## 2013-08-26 LAB — GLUCOSE, CAPILLARY
GLUCOSE-CAPILLARY: 442 mg/dL — AB (ref 70–99)
GLUCOSE-CAPILLARY: 335 mg/dL — AB (ref 70–99)
Glucose-Capillary: 402 mg/dL — ABNORMAL HIGH (ref 70–99)

## 2013-08-26 LAB — URINE MICROSCOPIC-ADD ON

## 2013-08-26 LAB — KETONES, QUALITATIVE

## 2013-08-26 MED ORDER — SODIUM CHLORIDE 0.9 % IV BOLUS (SEPSIS)
1000.0000 mL | Freq: Once | INTRAVENOUS | Status: AC
  Administered 2013-08-26: 1000 mL via INTRAVENOUS

## 2013-08-26 MED ORDER — HYDROCODONE-ACETAMINOPHEN 5-325 MG PO TABS
1.0000 | ORAL_TABLET | Freq: Once | ORAL | Status: AC
  Administered 2013-08-26: 1 via ORAL
  Filled 2013-08-26: qty 1

## 2013-08-26 MED ORDER — INSULIN ASPART 100 UNIT/ML ~~LOC~~ SOLN
10.0000 [IU] | Freq: Once | SUBCUTANEOUS | Status: AC
  Administered 2013-08-26: 10 [IU] via SUBCUTANEOUS

## 2013-08-26 MED ORDER — HYDROCODONE-ACETAMINOPHEN 5-325 MG PO TABS: 1.0000 | ORAL_TABLET | Freq: Four times a day (QID) | ORAL | Status: DC | PRN

## 2013-08-26 MED ORDER — HYDROGEN PEROXIDE 3 % EX SOLN
CUTANEOUS | Status: AC
  Filled 2013-08-26: qty 473

## 2013-08-26 NOTE — ED Provider Notes (Signed)
CSN: [id number]     Arrival date & time 08/26/13  1549 History   This chart was scribed for Judy Diego, MD by Era Bumpers, ED scribe. This patient was seen in room APA04/APA04 and the patient's care was started at 1549.  Chief Complaint  Patient presents with  . Hyperglycemia   Patient is a 53 y.o. female presenting with diabetes problem. The history is provided by the patient. No language interpreter was used.  Diabetes This is a chronic problem. The current episode started more than 1 week ago. The problem occurs constantly. The problem has been gradually worsening. Associated symptoms include headaches. Pertinent negatives include no chest pain, no abdominal pain and no shortness of breath. Nothing aggravates the symptoms. Nothing relieves the symptoms. Treatments tried: new medicine novolin.   HPI Comments: Judy SEIFFERT is a 53 y.o. female who presents to the Emergency Department w/hx of DM complaining of elevated blood sugar because she has been unable to afford her regular lantus insulin for the past x2 weeks. She reports was just started on novolin yesterday. She reports that her blood sugar has been up and down over the past two weeks.   She also reports some sinus pressure and congestion. She saw her PCP Dr. Hilma Favors for this problem who gave her a steroid shot to relieve her sinus pressure. She saw Dr. Hilma Favors today who reccommended her to bee seen at the ER for her elevated blood sugar.   Past Medical History  Diagnosis Date  . Diabetes mellitus   . Depression   . Constipation   . Fibromyalgia   . Anemia   . Anxiety    Past Surgical History  Procedure Laterality Date  . Knee surgery    . Cystectomy    . Shoulder surgery      right  . Endometrial ablation    . Colonoscopy  02/26/2012    SLF: Internal hemorrhoids/TCS IN 10 YEARS WITH PROPOFOL  . Lumbar laminectomy/decompression microdiscectomy N/A 05/14/2013    Procedure: RIGHT L4-L5 DECOMPRESSION AND CYST  REMOVAL;  Surgeon: Melina Schools, MD;  Location: [facility];  Service: Orthopedics;  Laterality: N/A;   Family History  Problem Relation Age of Onset  . Hypertension Mother   . Cancer Mother   . Asthma Sister   . Asthma Brother   . Stomach cancer      distant relative on dad's side of family  . Colon cancer Neg Hx    History  Substance Use Topics  . Smoking status: Former Smoker -- 0.50 packs/day for 15 years    Types: Cigarettes    Quit date: 04/29/1991  . Smokeless tobacco: Never Used  . Alcohol Use: No     Comment: occ wine; not now   OB History   Grav Para Term Preterm Abortions TAB SAB Ect Mult Living   5 4 4  1  1   4      Review of Systems  Constitutional: Negative for appetite change and fatigue.  HENT: Positive for sinus pressure. Negative for ear discharge.   Eyes: Negative for discharge.  Respiratory: Negative for cough and shortness of breath.   Cardiovascular: Negative for chest pain.  Gastrointestinal: Negative for abdominal pain and diarrhea.  Genitourinary: Negative for frequency and hematuria.  Musculoskeletal: Negative for back pain.  Skin: Negative for rash.  Neurological: Positive for headaches. Negative for seizures.  Psychiatric/Behavioral: Negative for hallucinations.  All other systems reviewed and are negative.    Allergies  Review  of patient's allergies indicates no known allergies.  Home Medications   Current Outpatient Rx  Name  Route  Sig  Dispense  Refill  . aspirin EC 81 MG tablet   Oral   Take 81 mg by mouth daily.          Marland Kitchen atorvastatin (LIPITOR) 40 MG tablet   Oral   Take 40 mg by mouth daily.          Marland Kitchen docusate sodium (COLACE) 100 MG capsule   Oral   Take 1 capsule (100 mg total) by mouth 3 (three) times daily as needed for constipation.   30 capsule   0   . fexofenadine (ALLEGRA) 180 MG tablet   Oral   Take 180 mg by mouth daily.          Marland Kitchen gabapentin (NEURONTIN) 300 MG capsule   Oral   Take 300 mg by mouth 3  (three) times daily.          . hydrochlorothiazide (HYDRODIURIL) 25 MG tablet   Oral   Take 25 mg by mouth daily.          . insulin glargine (LANTUS) 100 UNIT/ML injection   Subcutaneous   Inject 50 Units into the skin at bedtime. For BS >200--sliding scale         . insulin lispro protamine-lispro (HUMALOG 75/25) (75-25) 100 UNIT/ML SUSP   Subcutaneous   Inject 20-30 Units into the skin 3 (three) times daily with meals. Sliding scale         . losartan (COZAAR) 100 MG tablet   Oral   Take 1 tablet (100 mg total) by mouth daily.   30 tablet   0   . losartan-hydrochlorothiazide (HYZAAR) 100-12.5 MG per tablet   Oral   Take 1 tablet by mouth daily.         . methocarbamol (ROBAXIN) 500 MG tablet   Oral   Take 1 tablet (500 mg total) by mouth 3 (three) times daily as needed.   60 tablet   0   . metoCLOPramide (REGLAN) 10 MG tablet   Oral   Take 1 tablet (10 mg total) by mouth every 8 (eight) hours as needed (headache with nausea).   20 tablet   0   . metroNIDAZOLE (FLAGYL) 500 MG tablet   Oral   Take 1 tablet (500 mg total) by mouth 2 (two) times daily.   14 tablet   0   . Misc. Devices (VAGINAL CREAM APPLICATOR) MISC   Vaginal   Place 0.4 % vaginally.         Marland Kitchen oxyCODONE-acetaminophen (PERCOCET) 10-325 MG per tablet   Oral   Take 1 tablet by mouth every 4 (four) hours as needed for pain.   60 tablet   0   . potassium chloride (K-DUR,KLOR-CON) 10 MEQ tablet   Oral   Take 10 mEq by mouth daily.         . traZODone (DESYREL) 100 MG tablet   Oral   Take 100 mg by mouth at bedtime.           Triage Vitals: BP 129/71  Pulse 91  Temp(Src) 98.4 F (36.9 C) (Oral)  Resp 17  SpO2 96%  Physical Exam  Nursing note and vitals reviewed. Constitutional: She is oriented to person, place, and time. She appears well-developed and well-nourished. No distress.  HENT:  Head: Normocephalic and atraumatic.  Minor tenderness to frontal sinuses  Eyes:  Conjunctivae are normal.  Right eye exhibits no discharge. Left eye exhibits no discharge.  Neck: Normal range of motion.  Cardiovascular: Normal rate.   Pulmonary/Chest: Effort normal. No respiratory distress.  Musculoskeletal: Normal range of motion. She exhibits no edema.  Neurological: She is alert and oriented to person, place, and time.  Skin: Skin is warm and dry.  Psychiatric: She has a normal mood and affect. Thought content normal.   ED Course  Procedures (including critical care time) DIAGNOSTIC STUDIES: Oxygen Saturation is 100% on room air, normal by my interpretation.    COORDINATION OF CARE: At 417 PM Discussed treatment plan with patient which includes CBG monitoring, blood work, UA, IV fluids. Patient agrees.   2121 Recheck: Pt reports that feels better. She is sitting comfortably in bed awake and alert, speaking in full sentences.  Labs Review Labs Reviewed  GLUCOSE, CAPILLARY - Abnormal; Notable for the following:    Glucose-Capillary 442 (*)    All other components within normal limits  CBC WITH DIFFERENTIAL  BASIC METABOLIC PANEL  URINALYSIS, ROUTINE W REFLEX MICROSCOPIC   Imaging Review No results found.  EKG Interpretation   None      MDM  The chart was scribed for me under my direct supervision.  I personally performed the history, physical, and medical decision making and all procedures in the evaluation of this patient.Marland Kitchen }     Judy Diego, MD 08/26/13 2125

## 2013-08-26 NOTE — Discharge Instructions (Signed)
Follow up with your md in 2-3 days.  Record your sugar results before each meal and at bed time.  Drink plenty of fluids

## 2013-08-26 NOTE — ED Notes (Signed)
Family at bedside. Patient wanting to know how long before test results are back. States that she is ready to eat.

## 2013-08-26 NOTE — ED Notes (Signed)
Pt reports could no longer afford her lantus insulin so has been without it x 2 weeks.  Reports was just put on novolin insulin yesterday.  Pt says the lantus worked well for her.  For the past 2 weeks her blood sugar has been up and down.  Pt c/o nausea and abd pain.

## 2013-09-15 ENCOUNTER — Other Ambulatory Visit: Payer: Self-pay | Admitting: Orthopedic Surgery

## 2013-09-15 DIAGNOSIS — M545 Low back pain, unspecified: Secondary | ICD-10-CM

## 2013-09-24 ENCOUNTER — Ambulatory Visit
Admission: RE | Admit: 2013-09-24 | Discharge: 2013-09-24 | Disposition: A | Discharge status: Home / Self Care | Payer: BC Managed Care – PPO | Source: Ambulatory Visit | Attending: Orthopedic Surgery | Admitting: Orthopedic Surgery

## 2013-09-24 DIAGNOSIS — M545 Low back pain, unspecified: Secondary | ICD-10-CM

## 2013-11-25 ENCOUNTER — Encounter (HOSPITAL_COMMUNITY): Payer: Self-pay | Admitting: Emergency Medicine

## 2013-11-25 ENCOUNTER — Inpatient Hospital Stay (HOSPITAL_COMMUNITY)
Admission: EM | Admit: 2013-11-25 | Discharge: 2013-11-27 | DRG: 638 | Disposition: A | Discharge status: Home / Self Care | Payer: Medicaid Other | Attending: Internal Medicine | Admitting: Internal Medicine

## 2013-11-25 DIAGNOSIS — Z7982 Long term (current) use of aspirin: Secondary | ICD-10-CM

## 2013-11-25 DIAGNOSIS — F32A Depression, unspecified: Secondary | ICD-10-CM

## 2013-11-25 DIAGNOSIS — E871 Hypo-osmolality and hyponatremia: Secondary | ICD-10-CM | POA: Diagnosis present

## 2013-11-25 DIAGNOSIS — F341 Dysthymic disorder: Secondary | ICD-10-CM

## 2013-11-25 DIAGNOSIS — I10 Essential (primary) hypertension: Secondary | ICD-10-CM | POA: Diagnosis present

## 2013-11-25 DIAGNOSIS — Z87891 Personal history of nicotine dependence: Secondary | ICD-10-CM

## 2013-11-25 DIAGNOSIS — E1110 Type 2 diabetes mellitus with ketoacidosis without coma: Secondary | ICD-10-CM | POA: Diagnosis present

## 2013-11-25 DIAGNOSIS — E1310 Other specified diabetes mellitus with ketoacidosis without coma: Principal | ICD-10-CM | POA: Diagnosis present

## 2013-11-25 DIAGNOSIS — R51 Headache: Secondary | ICD-10-CM | POA: Diagnosis present

## 2013-11-25 DIAGNOSIS — F3289 Other specified depressive episodes: Secondary | ICD-10-CM | POA: Diagnosis present

## 2013-11-25 DIAGNOSIS — R079 Chest pain, unspecified: Secondary | ICD-10-CM | POA: Diagnosis present

## 2013-11-25 DIAGNOSIS — F411 Generalized anxiety disorder: Secondary | ICD-10-CM | POA: Diagnosis present

## 2013-11-25 DIAGNOSIS — G8929 Other chronic pain: Secondary | ICD-10-CM | POA: Diagnosis present

## 2013-11-25 DIAGNOSIS — R0789 Other chest pain: Secondary | ICD-10-CM | POA: Diagnosis present

## 2013-11-25 DIAGNOSIS — Z794 Long term (current) use of insulin: Secondary | ICD-10-CM

## 2013-11-25 DIAGNOSIS — R112 Nausea with vomiting, unspecified: Secondary | ICD-10-CM | POA: Diagnosis present

## 2013-11-25 DIAGNOSIS — IMO0001 Reserved for inherently not codable concepts without codable children: Secondary | ICD-10-CM | POA: Diagnosis present

## 2013-11-25 DIAGNOSIS — K5900 Constipation, unspecified: Secondary | ICD-10-CM | POA: Diagnosis present

## 2013-11-25 DIAGNOSIS — R519 Headache, unspecified: Secondary | ICD-10-CM | POA: Diagnosis present

## 2013-11-25 DIAGNOSIS — Z91199 Patient's noncompliance with other medical treatment and regimen due to unspecified reason: Secondary | ICD-10-CM

## 2013-11-25 DIAGNOSIS — Z79899 Other long term (current) drug therapy: Secondary | ICD-10-CM

## 2013-11-25 DIAGNOSIS — Z9119 Patient's noncompliance with other medical treatment and regimen: Secondary | ICD-10-CM

## 2013-11-25 DIAGNOSIS — K219 Gastro-esophageal reflux disease without esophagitis: Secondary | ICD-10-CM | POA: Diagnosis present

## 2013-11-25 DIAGNOSIS — D649 Anemia, unspecified: Secondary | ICD-10-CM | POA: Diagnosis present

## 2013-11-25 DIAGNOSIS — F329 Major depressive disorder, single episode, unspecified: Secondary | ICD-10-CM

## 2013-11-25 DIAGNOSIS — E785 Hyperlipidemia, unspecified: Secondary | ICD-10-CM | POA: Diagnosis present

## 2013-11-25 LAB — I-STAT VENOUS BLOOD GAS, ED
ACID-BASE DEFICIT: 14 mmol/L — AB (ref 0.0–2.0)
Bicarbonate: 13 mEq/L — ABNORMAL LOW (ref 20.0–24.0)
O2 SAT: 58 %
PO2 VEN: 36 mmHg (ref 30.0–45.0)
TCO2: 14 mmol/L (ref 0–100)
pCO2, Ven: 33.6 mmHg — ABNORMAL LOW (ref 45.0–50.0)
pH, Ven: 7.195 — CL (ref 7.250–7.300)

## 2013-11-25 LAB — URINALYSIS, ROUTINE W REFLEX MICROSCOPIC
Bilirubin Urine: NEGATIVE
Hgb urine dipstick: NEGATIVE
LEUKOCYTES UA: NEGATIVE
Nitrite: NEGATIVE
PROTEIN: NEGATIVE mg/dL
Specific Gravity, Urine: 1.029 (ref 1.005–1.030)
UROBILINOGEN UA: 0.2 mg/dL (ref 0.0–1.0)
pH: 5 (ref 5.0–8.0)

## 2013-11-25 LAB — COMPREHENSIVE METABOLIC PANEL
ALK PHOS: 136 U/L — AB (ref 39–117)
ALT: 22 U/L (ref 0–35)
AST: 22 U/L (ref 0–37)
Albumin: 3.6 g/dL (ref 3.5–5.2)
BILIRUBIN TOTAL: 0.3 mg/dL (ref 0.3–1.2)
BUN: 28 mg/dL — ABNORMAL HIGH (ref 6–23)
CHLORIDE: 87 meq/L — AB (ref 96–112)
CO2: 12 meq/L — AB (ref 19–32)
Calcium: 9.9 mg/dL (ref 8.4–10.5)
Creatinine, Ser: 0.87 mg/dL (ref 0.50–1.10)
GFR calc Af Amer: 87 mL/min — ABNORMAL LOW (ref >90)
GFR, EST NON AFRICAN AMERICAN: 75 mL/min — AB (ref >90)
GLUCOSE: 696 mg/dL — AB (ref 70–99)
POTASSIUM: 5.9 meq/L — AB (ref 3.7–5.3)
Sodium: 135 mEq/L — ABNORMAL LOW (ref 137–147)
Total Protein: 7.5 g/dL (ref 6.0–8.3)

## 2013-11-25 LAB — CBC
HEMATOCRIT: 39.8 % (ref 36.0–46.0)
HEMOGLOBIN: 12.9 g/dL (ref 12.0–15.0)
MCH: 31.2 pg (ref 26.0–34.0)
MCHC: 32.4 g/dL (ref 30.0–36.0)
MCV: 96.4 fL (ref 78.0–100.0)
Platelets: 522 10*3/uL — ABNORMAL HIGH (ref 150–400)
RBC: 4.13 MIL/uL (ref 3.87–5.11)
RDW: 14.7 % (ref 11.5–15.5)
WBC: 7.8 10*3/uL (ref 4.0–10.5)

## 2013-11-25 LAB — BASIC METABOLIC PANEL
BUN: 23 mg/dL (ref 6–23)
BUN: 21 mg/dL (ref 6–23)
BUN: 21 mg/dL (ref 6–23)
CALCIUM: 8.5 mg/dL (ref 8.4–10.5)
CALCIUM: 8.4 mg/dL (ref 8.4–10.5)
CHLORIDE: 106 meq/L (ref 96–112)
CHLORIDE: 105 meq/L (ref 96–112)
CO2: 13 meq/L — AB (ref 19–32)
CO2: 12 mEq/L — ABNORMAL LOW (ref 19–32)
CO2: 17 mEq/L — ABNORMAL LOW (ref 19–32)
CREATININE: 0.82 mg/dL (ref 0.50–1.10)
Calcium: 8.3 mg/dL — ABNORMAL LOW (ref 8.4–10.5)
Chloride: 105 mEq/L (ref 96–112)
Creatinine, Ser: 0.72 mg/dL (ref 0.50–1.10)
Creatinine, Ser: 0.78 mg/dL (ref 0.50–1.10)
GFR calc Af Amer: >90 mL/min (ref >90)
GFR calc Af Amer: >90 mL/min (ref >90)
GFR calc Af Amer: >90 mL/min (ref >90)
GFR calc non Af Amer: 80 mL/min — ABNORMAL LOW (ref >90)
GLUCOSE: 282 mg/dL — AB (ref 70–99)
GLUCOSE: 167 mg/dL — AB (ref 70–99)
GLUCOSE: 176 mg/dL — AB (ref 70–99)
POTASSIUM: 5.3 meq/L (ref 3.7–5.3)
Potassium: 5.3 mEq/L (ref 3.7–5.3)
Potassium: 5.2 mEq/L (ref 3.7–5.3)
Sodium: 143 mEq/L (ref 137–147)
Sodium: 138 mEq/L (ref 137–147)
Sodium: 135 mEq/L — ABNORMAL LOW (ref 137–147)

## 2013-11-25 LAB — MRSA PCR SCREENING: MRSA by PCR: NEGATIVE

## 2013-11-25 LAB — GLUCOSE, CAPILLARY
Glucose-Capillary: 522 mg/dL — ABNORMAL HIGH (ref 70–99)
Glucose-Capillary: 261 mg/dL — ABNORMAL HIGH (ref 70–99)

## 2013-11-25 LAB — URINE MICROSCOPIC-ADD ON

## 2013-11-25 LAB — CBG MONITORING, ED
GLUCOSE-CAPILLARY: 551 mg/dL — AB (ref 70–99)
Glucose-Capillary: 552 mg/dL (ref 70–99)

## 2013-11-25 LAB — TROPONIN I: Troponin I: <0.3 ng/mL (ref <0.30)

## 2013-11-25 LAB — LACTIC ACID, PLASMA: Lactic Acid, Venous: 2.7 mmol/L — ABNORMAL HIGH (ref 0.5–2.2)

## 2013-11-25 LAB — I-STAT CG4 LACTIC ACID, ED: Lactic Acid, Venous: 2.37 mmol/L — ABNORMAL HIGH (ref 0.5–2.2)

## 2013-11-25 MED ORDER — DIAZEPAM 5 MG PO TABS: 10.0000 mg | ORAL_TABLET | Freq: Two times a day (BID) | ORAL | Status: DC | PRN

## 2013-11-25 MED ORDER — PANTOPRAZOLE SODIUM 40 MG IV SOLR
40.0000 mg | Freq: Once | INTRAVENOUS | Status: AC
  Administered 2013-11-25: 40 mg via INTRAVENOUS
  Filled 2013-11-25: qty 40

## 2013-11-25 MED ORDER — ASPIRIN EC 81 MG PO TBEC
81.0000 mg | DELAYED_RELEASE_TABLET | Freq: Every day | ORAL | Status: DC
  Administered 2013-11-26 – 2013-11-27 (×2): 81 mg via ORAL
  Filled 2013-11-25 (×2): qty 1

## 2013-11-25 MED ORDER — ENOXAPARIN SODIUM 40 MG/0.4ML ~~LOC~~ SOLN
40.0000 mg | SUBCUTANEOUS | Status: DC
  Administered 2013-11-25 – 2013-11-26 (×2): 40 mg via SUBCUTANEOUS
  Filled 2013-11-25 (×4): qty 0.4

## 2013-11-25 MED ORDER — SODIUM CHLORIDE 0.9 % IV BOLUS (SEPSIS)
1000.0000 mL | Freq: Once | INTRAVENOUS | Status: AC
  Administered 2013-11-25: 1000 mL via INTRAVENOUS

## 2013-11-25 MED ORDER — DEXTROSE-NACL 5-0.45 % IV SOLN: INTRAVENOUS | Status: DC

## 2013-11-25 MED ORDER — SODIUM CHLORIDE 0.9 % IV SOLN
INTRAVENOUS | Status: DC
  Administered 2013-11-25 – 2013-11-26 (×3): via INTRAVENOUS

## 2013-11-25 MED ORDER — SODIUM CHLORIDE 0.9 % IV SOLN
1000.0000 mL | INTRAVENOUS | Status: DC
  Administered 2013-11-25: 1000 mL via INTRAVENOUS

## 2013-11-25 MED ORDER — SODIUM CHLORIDE 0.9 % IV SOLN
INTRAVENOUS | Status: DC
  Administered 2013-11-25: 4.9 [IU]/h via INTRAVENOUS
  Filled 2013-11-25 (×2): qty 1

## 2013-11-25 MED ORDER — GI COCKTAIL ~~LOC~~
30.0000 mL | Freq: Two times a day (BID) | ORAL | Status: DC | PRN
  Filled 2013-11-25: qty 30

## 2013-11-25 MED ORDER — LOSARTAN POTASSIUM 50 MG PO TABS
100.0000 mg | ORAL_TABLET | Freq: Every day | ORAL | Status: DC
  Administered 2013-11-25 – 2013-11-27 (×3): 100 mg via ORAL
  Filled 2013-11-25 (×3): qty 2

## 2013-11-25 MED ORDER — DEXTROSE 50 % IV SOLN: 25.0000 mL | INTRAVENOUS | Status: DC | PRN

## 2013-11-25 MED ORDER — HYDROCHLOROTHIAZIDE 12.5 MG PO CAPS
12.5000 mg | ORAL_CAPSULE | Freq: Every day | ORAL | Status: DC
  Administered 2013-11-25 – 2013-11-27 (×3): 12.5 mg via ORAL
  Filled 2013-11-25 (×3): qty 1

## 2013-11-25 MED ORDER — SODIUM CHLORIDE 0.9 % IV SOLN
1000.0000 mL | Freq: Once | INTRAVENOUS | Status: AC
  Administered 2013-11-25: 1000 mL via INTRAVENOUS

## 2013-11-25 MED ORDER — SODIUM CHLORIDE 0.9 % IV SOLN
INTRAVENOUS | Status: AC
  Filled 2013-11-25: qty 1

## 2013-11-25 MED ORDER — LOSARTAN POTASSIUM-HCTZ 100-12.5 MG PO TABS: 1.0000 | ORAL_TABLET | Freq: Every day | ORAL | Status: DC

## 2013-11-25 MED ORDER — KETOROLAC TROMETHAMINE 30 MG/ML IJ SOLN
30.0000 mg | Freq: Once | INTRAMUSCULAR | Status: AC
  Administered 2013-11-25: 30 mg via INTRAVENOUS
  Filled 2013-11-25: qty 1

## 2013-11-25 MED ORDER — ONDANSETRON HCL 4 MG/2ML IJ SOLN
4.0000 mg | Freq: Once | INTRAMUSCULAR | Status: AC
  Administered 2013-11-25: 4 mg via INTRAVENOUS
  Filled 2013-11-25: qty 2

## 2013-11-25 MED ORDER — PANTOPRAZOLE SODIUM 40 MG PO TBEC
40.0000 mg | DELAYED_RELEASE_TABLET | Freq: Every day | ORAL | Status: DC
  Administered 2013-11-26 – 2013-11-27 (×2): 40 mg via ORAL
  Filled 2013-11-25 (×2): qty 1

## 2013-11-25 MED ORDER — PROMETHAZINE HCL 25 MG/ML IJ SOLN
12.5000 mg | INTRAMUSCULAR | Status: DC | PRN
  Administered 2013-11-25: 12.5 mg via INTRAVENOUS
  Filled 2013-11-25: qty 1

## 2013-11-25 MED ORDER — DEXTROSE-NACL 5-0.45 % IV SOLN
INTRAVENOUS | Status: DC
  Administered 2013-11-25 – 2013-11-26 (×2): via INTRAVENOUS

## 2013-11-25 MED ORDER — TRAZODONE HCL 50 MG PO TABS
100.0000 mg | ORAL_TABLET | Freq: Every evening | ORAL | Status: DC | PRN
  Administered 2013-11-26: 100 mg via ORAL
  Filled 2013-11-25: qty 1

## 2013-11-25 MED ORDER — HYDROCODONE-ACETAMINOPHEN 5-325 MG PO TABS
1.0000 | ORAL_TABLET | Freq: Four times a day (QID) | ORAL | Status: DC | PRN
  Administered 2013-11-26: 1 via ORAL
  Filled 2013-11-25: qty 1

## 2013-11-25 MED ORDER — SODIUM CHLORIDE 0.9 % IV SOLN
INTRAVENOUS | Status: AC
  Administered 2013-11-25: 19:00:00 via INTRAVENOUS

## 2013-11-25 NOTE — Progress Notes (Signed)
Date: 11/25/2013               Patient Name:  [name] MRN: [id number]  DOB: 01-15-1961 Age / Sex: 53 y.o., female   PCP: Provider Default, MD              Medical Service: Internal Medicine Teaching Service              Attending Physician: Dr. Bartholomew Crews, MD    First Contact:  [place] Pager: [phone]  Second Contact: Dr. Aundra Dubin Pager: [phone]  Third Contact Dr. Gordy Levan Pager: [phone]       After Hours (After 5p/  First Contact Pager: [phone]  weekends / holidays): Second Contact Pager: [phone]   Chief Complaint: Nausea and vomiting  History of Present Illness: 53 yo F known diabetic came with c/o nausea and vomiting.Non compliant with her insulin, never took during last week. C/o headache started Friday, nausea and epigastric pain since Sunday. Started vomiting this morning. C/O left sided chest pain , non radiating, intermittent, lasting for 1-2 min. Occurs 2x daily.relieved with aspirin, no aggrevating factor.She recently lost her job, unable to pay copayment so ran out her insulin since last week.H/o DKA before. C/O palpitation and mild SOB. Denies any fever, chills,change in vision, recent change in wt. Appetite, urinary or bowel habits. Did c/o diarrhea on Saturday, one episode.Denies any dysuria.  Meds: Current Facility-Administered Medications  Medication Dose Route Frequency Provider Last Rate Last Dose  . 0.9 %  sodium chloride infusion  1,000 mL Intravenous Continuous Renaldo Reel, MD 150 mL/hr at 11/25/13 1723 1,000 mL at 11/25/13 1723  . dextrose 5 %-0.45 % sodium chloride infusion   Intravenous Continuous Renaldo Reel, MD      . insulin regular (NOVOLIN R,HUMULIN R) 1 Units/mL in sodium chloride 0.9 % 100 mL infusion   Intravenous Continuous Renaldo Reel, MD 4.9 mL/hr at 11/25/13 1730 4.9 Units/hr at 11/25/13 1730  . promethazine (PHENERGAN) injection 12.5 mg  12.5 mg Intravenous Q4H PRN Renaldo Reel, MD   12.5 mg at 11/25/13 1657   Current  Outpatient Prescriptions  Medication Sig Dispense Refill  . aspirin EC 81 MG tablet Take 81 mg by mouth daily.       Marland Kitchen atorvastatin (LIPITOR) 40 MG tablet Take 40 mg by mouth at bedtime.       . diazepam (VALIUM) 10 MG tablet Take 10 mg by mouth every 12 (twelve) hours as needed for anxiety.      . fexofenadine (ALLEGRA) 180 MG tablet Take 180 mg by mouth daily.       . fluticasone (FLONASE) 50 MCG/ACT nasal spray Place 1 spray into both nostrils daily.      Marland Kitchen gabapentin (NEURONTIN) 300 MG capsule Take 300 mg by mouth 3 (three) times daily.       Marland Kitchen HYDROcodone-acetaminophen (NORCO/VICODIN) 5-325 MG per tablet Take 1 tablet by mouth every 6 (six) hours as needed for moderate pain.  10 tablet  0  . insulin glargine (LANTUS) 100 UNIT/ML injection Inject 60 Units into the skin at bedtime. For BS >200--sliding scale      . insulin lispro protamine-lispro (HUMALOG 75/25) (75-25) 100 UNIT/ML SUSP Inject 20-30 Units into the skin 3 (three) times daily with meals. Sliding scale      . losartan-hydrochlorothiazide (HYZAAR) 100-12.5 MG per tablet Take 1 tablet by mouth daily.      . metoCLOPramide (REGLAN) 10 MG tablet Take 1 tablet (10 mg total) by  mouth every 8 (eight) hours as needed (headache with nausea).  20 tablet  0  . potassium chloride (K-DUR,KLOR-CON) 10 MEQ tablet Take 10 mEq by mouth daily.      . traZODone (DESYREL) 100 MG tablet Take 100 mg by mouth at bedtime as needed for sleep.       Marland Kitchen docusate sodium (COLACE) 100 MG capsule Take 1 capsule (100 mg total) by mouth 3 (three) times daily as needed for constipation.  30 capsule  0    Allergies: Allergies as of 11/25/2013  . (No Known Allergies)   Past Medical History  Diagnosis Date  . Diabetes mellitus   . Depression   . Constipation   . Fibromyalgia   . Anemia   . Anxiety    Past Surgical History  Procedure Laterality Date  . Knee surgery    . Cystectomy    . Shoulder surgery      right  . Endometrial ablation    .  Colonoscopy  02/26/2012    SLF: Internal hemorrhoids/TCS IN 10 YEARS WITH PROPOFOL  . Lumbar laminectomy/decompression microdiscectomy N/A 05/14/2013    Procedure: RIGHT L4-L5 DECOMPRESSION AND CYST REMOVAL;  Surgeon: Melina Schools, MD;  Location: [facility];  Service: Orthopedics;  Laterality: N/A;   Family History  Problem Relation Age of Onset  . Hypertension Mother   . Cancer Mother   . Asthma Sister   . Asthma Brother   . Stomach cancer      distant relative on dad's side of family  . Colon cancer Neg Hx    History   Social History  . Marital Status: Divorced    Spouse Name: N/A    Number of Children: N/A  . Years of Education: N/A   Occupational History  . Not on file.   Social History Main Topics  . Smoking status: Former Smoker -- 0.50 packs/day for 15 years    Types: Cigarettes    Quit date: 04/29/1991  . Smokeless tobacco: Never Used  . Alcohol Use: No     Comment: occ wine; not now  . Drug Use: No     Comment: crack/cocaine 20 yrs ago  . Sexual Activity: No   Other Topics Concern  . Not on file   Social History Narrative  . No narrative on file    Review of Systems: Pertinent items are noted in HPI.  Physical Exam: Blood pressure 131/51, pulse 108, temperature 98.6 F (37 C), temperature source Oral, resp. rate 18, SpO2 100.00%. BP 131/51  Pulse 108  Temp(Src) 98.6 F (37 C) (Oral)  Resp 18  SpO2 100%  General Appearance:    Alert, cooperative, in acute distress  Head:    Normocephalic, without obvious abnormality, atraumatic  Eyes:    PERRL, conjunctiva/corneas clear, EOM's intact, fundi    benign, both eyes  Ears:    Normal TM's and external ear canals, both ears  Nose:   Nares normal, septum midline, mucosa normal, no drainage    or sinus tenderness  Throat:   Lips, mucosa, and tongue dry; teeth and gums normal  Neck:   Supple, symmetrical, trachea midline, no adenopathy;    thyroid:  no enlargement/tenderness/nodules; no carotid   bruit or  JVD  Back:     Symmetric, no curvature, ROM normal, no CVA tenderness  Lungs:     Clear to auscultation bilaterally, respirations unlabored  Chest Wall:    No tenderness or deformity   Heart:    Regular rate  and rhythm, S1 and S2 normal, no murmur, rub   or gallop  Breast Exam:    No tenderness, masses, or nipple abnormality  Abdomen:     Soft, mildly tender epigastrium, bowel sounds active all four quadrants,    no masses, no organomegaly        Extremities:   Extremities normal, atraumatic, no cyanosis or edema  Pulses:   2+ and symmetric all extremities  Skin:   Skin color, texture, turgor normal, no rashes or lesions  Lymph nodes:   Cervical, supraclavicular, and axillary nodes normal  Neurologic:   CNII-XII intact, normal strength, sensation and reflexes    throughout    Lab results: @LABTEST @  Imaging results:  No results found.  Other results: EKG:   Assessment & Plan by Problem: Active Problems:   DKA (diabetic ketoacidoses) .9% saline  dextrose 5% -0.45% sodium chloride infusion Insulin drip BMENT every 2hrs. Replace K when less then 4. Phenergan injection for nausea toradol for headache GI cocktail EKG  This is a Careers information officer Note.  The care of the patient was discussed with Dr. Aundra Dubin and the assessment and plan was formulated with their assistance.  Please see their note for official documentation of the patient encounter.   SignedLorella Nimrod, Med Student 11/25/2013, 6:08 PM

## 2013-11-25 NOTE — ED Provider Notes (Signed)
CSN: [id number]     Arrival date & time 11/25/13  1349 History   First MD Initiated Contact with Patient 11/25/13 [phone]     Chief Complaint  Patient presents with  . Hyperglycemia     (Consider location/radiation/quality/duration/timing/severity/associated sxs/prior Treatment) The history is provided by the patient.   history of present illness: 53 year old female with history of insulin-dependent diabetes who presents with chief complaint of hyperglycemia. Onset of symptoms was 2-3 days ago. Patient reports that she hasn't taken her insulin for the past week because she is trying to get it approved for her insurance. She has had associated symptoms today of worsening nausea and headache consistent with prior episodes of DKA. She is also had associated polyuria and polydipsia. Patient had yeast infection last week that was treated with Diflucan. No other recent illness.  Past Medical History  Diagnosis Date  . Diabetes mellitus   . Depression   . Constipation   . Fibromyalgia   . Anemia   . Anxiety    Past Surgical History  Procedure Laterality Date  . Knee surgery    . Cystectomy    . Shoulder surgery      right  . Endometrial ablation    . Colonoscopy  02/26/2012    SLF: Internal hemorrhoids/TCS IN 10 YEARS WITH PROPOFOL  . Lumbar laminectomy/decompression microdiscectomy N/A 05/14/2013    Procedure: RIGHT L4-L5 DECOMPRESSION AND CYST REMOVAL;  Surgeon: Melina Schools, MD;  Location: [facility];  Service: Orthopedics;  Laterality: N/A;   Family History  Problem Relation Age of Onset  . Hypertension Mother   . Cancer Mother   . Asthma Sister   . Asthma Brother   . Stomach cancer      distant relative on dad's side of family  . Colon cancer Neg Hx    History  Substance Use Topics  . Smoking status: Former Smoker -- 0.50 packs/day for 15 years    Types: Cigarettes    Quit date: 04/29/1991  . Smokeless tobacco: Never Used  . Alcohol Use: No     Comment: occ wine; not now    OB History   Grav Para Term Preterm Abortions TAB SAB Ect Mult Living   5 4 4  1  1   4      Review of Systems  Constitutional: Negative for fever and chills.  HENT: Negative for congestion.   Eyes: Negative for pain.  Respiratory: Negative for shortness of breath.   Cardiovascular: Negative for chest pain.  Gastrointestinal: Positive for nausea. Negative for vomiting, abdominal pain, diarrhea and constipation.  Genitourinary: Negative for dysuria.  Musculoskeletal: Negative for back pain.  Skin: Negative for rash and wound.  Neurological: Positive for headaches.  All other systems reviewed and are negative.     Allergies  Review of patient's allergies indicates no known allergies.  Home Medications   Prior to Admission medications   Medication Sig Start Date End Date Taking? Authorizing Provider  aspirin EC 81 MG tablet Take 81 mg by mouth daily.    Yes Historical Provider, MD  atorvastatin (LIPITOR) 40 MG tablet Take 40 mg by mouth at bedtime.    Yes Historical Provider, MD  diazepam (VALIUM) 10 MG tablet Take 10 mg by mouth every 12 (twelve) hours as needed for anxiety.   Yes Historical Provider, MD  fexofenadine (ALLEGRA) 180 MG tablet Take 180 mg by mouth daily.    Yes Historical Provider, MD  fluticasone (FLONASE) 50 MCG/ACT nasal spray Place 1 spray  into both nostrils daily.   Yes Historical Provider, MD  gabapentin (NEURONTIN) 300 MG capsule Take 300 mg by mouth 3 (three) times daily.  12/18/11  Yes Historical Provider, MD  HYDROcodone-acetaminophen (NORCO/VICODIN) 5-325 MG per tablet Take 1 tablet by mouth every 6 (six) hours as needed for moderate pain. 08/26/13  Yes Maudry Diego, MD  insulin glargine (LANTUS) 100 UNIT/ML injection Inject 60 Units into the skin at bedtime. For BS >200--sliding scale   Yes Historical Provider, MD  insulin lispro protamine-lispro (HUMALOG 75/25) (75-25) 100 UNIT/ML SUSP Inject 20-30 Units into the skin 3 (three) times daily with meals.  Sliding scale   Yes Historical Provider, MD  losartan-hydrochlorothiazide (HYZAAR) 100-12.5 MG per tablet Take 1 tablet by mouth daily. 07/06/13  Yes Historical Provider, MD  metoCLOPramide (REGLAN) 10 MG tablet Take 1 tablet (10 mg total) by mouth every 8 (eight) hours as needed (headache with nausea). 06/11/13  Yes Carmin Muskrat, MD  potassium chloride (K-DUR,KLOR-CON) 10 MEQ tablet Take 10 mEq by mouth daily.   Yes Historical Provider, MD  traZODone (DESYREL) 100 MG tablet Take 100 mg by mouth at bedtime as needed for sleep.    Yes Historical Provider, MD  docusate sodium (COLACE) 100 MG capsule Take 1 capsule (100 mg total) by mouth 3 (three) times daily as needed for constipation. 05/18/13   Benjiman Core, PA-C   BP 126/66  Pulse 95  Temp(Src) 98.5 F (36.9 C) (Oral)  Resp 18  Ht 5\' 5"  (1.651 m)  Wt 188 lb 15.7 oz (85.72 kg)  BMI 31.45 kg/m2  SpO2 94% Physical Exam  Nursing note and vitals reviewed. Constitutional: She is oriented to person, place, and time. She appears well-developed and well-nourished. No distress.  HENT:  Head: Normocephalic and atraumatic.  Eyes: Conjunctivae are normal.  Neck: Neck supple.  Cardiovascular: Regular rhythm, normal heart sounds and intact distal pulses.  Tachycardia present.   Pulmonary/Chest: Effort normal and breath sounds normal. She has no wheezes. She has no rales.  Abdominal: Soft. She exhibits no distension. There is no tenderness.  Musculoskeletal: Normal range of motion.  Neurological: She is alert and oriented to person, place, and time.  Skin: Skin is warm and dry.    ED Course  Procedures (including critical care time) Labs Review Labs Reviewed  CBC - Abnormal; Notable for the following:    Platelets 522 (*)    All other components within normal limits  COMPREHENSIVE METABOLIC PANEL - Abnormal; Notable for the following:    Sodium 135 (*)    Potassium 5.9 (*)    Chloride 87 (*)    CO2 12 (*)    Glucose, Bld 696 (*)     BUN 28 (*)    Alkaline Phosphatase 136 (*)    GFR calc non Af Amer 75 (*)    GFR calc Af Amer 87 (*)    All other components within normal limits  URINALYSIS, ROUTINE W REFLEX MICROSCOPIC - Abnormal; Notable for the following:    Glucose, UA >1000 (*)    Ketones, ur >80 (*)    All other components within normal limits  LACTIC ACID, PLASMA - Abnormal; Notable for the following:    Lactic Acid, Venous 2.7 (*)    All other components within normal limits  BASIC METABOLIC PANEL - Abnormal; Notable for the following:    CO2 13 (*)    Glucose, Bld 282 (*)    GFR calc non Af Amer 80 (*)  All other components within normal limits  BASIC METABOLIC PANEL - Abnormal; Notable for the following:    CO2 12 (*)    Glucose, Bld 167 (*)    All other components within normal limits  BASIC METABOLIC PANEL - Abnormal; Notable for the following:    Sodium 135 (*)    CO2 17 (*)    Glucose, Bld 176 (*)    Calcium 8.3 (*)    All other components within normal limits  GLUCOSE, CAPILLARY - Abnormal; Notable for the following:    Glucose-Capillary 522 (*)    All other components within normal limits  GLUCOSE, CAPILLARY - Abnormal; Notable for the following:    Glucose-Capillary 261 (*)    All other components within normal limits  GLUCOSE, CAPILLARY - Abnormal; Notable for the following:    Glucose-Capillary 168 (*)    All other components within normal limits  GLUCOSE, CAPILLARY - Abnormal; Notable for the following:    Glucose-Capillary 168 (*)    All other components within normal limits  GLUCOSE, CAPILLARY - Abnormal; Notable for the following:    Glucose-Capillary 159 (*)    All other components within normal limits  GLUCOSE, CAPILLARY - Abnormal; Notable for the following:    Glucose-Capillary 121 (*)    All other components within normal limits  CBG MONITORING, ED - Abnormal; Notable for the following:    Glucose-Capillary >600 (*)    All other components within normal limits  I-STAT  CG4 LACTIC ACID, ED - Abnormal; Notable for the following:    Lactic Acid, Venous 2.37 (*)    All other components within normal limits  I-STAT VENOUS BLOOD GAS, ED - Abnormal; Notable for the following:    pH, Ven 7.195 (*)    pCO2, Ven 33.6 (*)    Bicarbonate 13.0 (*)    Acid-base deficit 14.0 (*)    All other components within normal limits  CBG MONITORING, ED - Abnormal; Notable for the following:    Glucose-Capillary 552 (*)    All other components within normal limits  CBG MONITORING, ED - Abnormal; Notable for the following:    Glucose-Capillary 551 (*)    All other components within normal limits  MRSA PCR SCREENING  URINE MICROSCOPIC-ADD ON  TROPONIN I  BASIC METABOLIC PANEL  CBC  TROPONIN I  TROPONIN I  HEMOGLOBIN A1C    Imaging Review No results found.   EKG Interpretation None      MDM   Final diagnoses:  DKA (diabetic ketoacidoses)    53 year old female with history of insulin-dependent diabetes who has not taken her insulin during the past week who presents with hyperglycemia, nausea, polyuria, polydipsia. Patient tachycardic vital signs otherwise stable.  Presentation concerning for DKA or HHS. Patient given IV fluid bolus.  Labs remarkable for glucose of 696. Anion gap of 36. With a bicarbonate of 12. Potassium 5.9. Lactic acid 2.3. VBG 7.19/33. And urinalysis positive for ketones. Presentation consistent with DKA.  Patient given additional IV fluid bolus and started on a rate of 150 miles per hour. Insulin drip also initiated.  Internal medicine consulted and will admit to step down for further management.  Renaldo Reel, MD 11/26/13 [phone]

## 2013-11-25 NOTE — H&P (Signed)
Date: 11/25/2013               Patient Name:  [name] MRN: [id number]  DOB: 07-02-1961 Age / Sex: 53 y.o., female   PCP: Provider Default, MD           Medical Service: Internal Medicine Teaching Service         Attending Physician: Dr. Bartholomew Crews, MD    First Contact: Dr. Michail Jewels, MD Pager: [phone] (7AM-5PM Mon-Fri)  Second Contact: Dr. Karlyn Agee, MD Pager: [phone]       After Hours (After 5p/  First Contact Pager: [phone]  weekends / holidays): Second Contact Pager: [phone]    Most Recent Discharge Date:  08/26/13  Chief Complaint:  Chief Complaint  Patient presents with  . Hyperglycemia       History of Present Illness:  [name] is a 53 y.o. female with a past medical history of DMII, DKA (04/14/2012), HTN, fibromyalgia, depression/anxiety who p/w HA, N/V, abdominal pain.  She reports being out of her insulin for the past week d/t insurance issues and losing her job and was unable to get her insulin.  She began having a HA, polyuria, polydipsia, and associated nausea and CP on Friday.  She checked blood sugar and it was over 400.  Her daughter also has DM and she took some of her insulin which brought her bs down some.  She was visiting a friend today in the hospital when she began feeling more nauseated and began vomiting.  She reports non-exertional substernal CP that started on Sunday, was sharp, comes and goes, lasts for a few minutes, and does not radiate or change with position.  She reports relief with ASA.  She denies any FH of MI.  Today, she reports the CP is more like a burning sensation that feels more like acid reflux.  She is also belching a lot. She denies any dysuria or diarrhea/constipation.    She reports having a previous episode of DKA a couple of years ago and states this feels like her prior episode.    ED course: In the ED, pt serum glucose was 696 with bicarb of 12.  She was started on insulin gtt with  IVF.  Meds: Current Facility-Administered Medications  Medication Dose Route Frequency Provider Last Rate Last Dose  . 0.9 %  sodium chloride infusion   Intravenous Continuous Cresenciano Genre, MD 999 mL/hr at 11/25/13 1846    . 0.9 %  sodium chloride infusion   Intravenous Continuous Cresenciano Genre, MD 125 mL/hr at 11/25/13 1842    . [START ON 11/26/2013] aspirin EC tablet 81 mg  81 mg Oral Daily Cresenciano Genre, MD      . dextrose 5 %-0.45 % sodium chloride infusion   Intravenous Continuous Cresenciano Genre, MD      . dextrose 50 % solution 25 mL  25 mL Intravenous PRN Cresenciano Genre, MD      . diazepam (VALIUM) tablet 10 mg  10 mg Oral Q12H PRN Cresenciano Genre, MD      . enoxaparin (LOVENOX) injection 40 mg  40 mg Subcutaneous Q24H Cresenciano Genre, MD      . gi cocktail (Maalox,Lidocaine,Donnatal)  30 mL Oral BID PRN Cresenciano Genre, MD      . losartan (COZAAR) tablet 100 mg  100 mg Oral Daily Bartholomew Crews, MD       And  . hydrochlorothiazide (MICROZIDE)  capsule 12.5 mg  12.5 mg Oral Daily Bartholomew Crews, MD      . HYDROcodone-acetaminophen (NORCO/VICODIN) 5-325 MG per tablet 1 tablet  1 tablet Oral Q6H PRN Cresenciano Genre, MD      . insulin regular (NOVOLIN R,HUMULIN R) 1 Units/mL in sodium chloride 0.9 % 100 mL infusion   Intravenous Continuous Cresenciano Genre, MD      . ketorolac (TORADOL) 30 MG/ML injection 30 mg  30 mg Intravenous Once Cresenciano Genre, MD      . Derrill Memo ON 11/26/2013] pantoprazole (PROTONIX) EC tablet 40 mg  40 mg Oral Daily Cresenciano Genre, MD      . promethazine (PHENERGAN) injection 12.5 mg  12.5 mg Intravenous Q4H PRN Renaldo Reel, MD   12.5 mg at 11/25/13 1657  . sodium chloride 0.9 % bolus 1,000 mL  1,000 mL Intravenous Once Cresenciano Genre, MD   1,000 mL at 11/25/13 1845  . traZODone (DESYREL) tablet 100 mg  100 mg Oral QHS PRN Cresenciano Genre, MD        Prescriptions prior to admission  Medication Sig Dispense Refill  . aspirin EC 81 MG tablet Take 81 mg by mouth  daily.       Marland Kitchen atorvastatin (LIPITOR) 40 MG tablet Take 40 mg by mouth at bedtime.       . diazepam (VALIUM) 10 MG tablet Take 10 mg by mouth every 12 (twelve) hours as needed for anxiety.      . fexofenadine (ALLEGRA) 180 MG tablet Take 180 mg by mouth daily.       . fluticasone (FLONASE) 50 MCG/ACT nasal spray Place 1 spray into both nostrils daily.      Marland Kitchen gabapentin (NEURONTIN) 300 MG capsule Take 300 mg by mouth 3 (three) times daily.       Marland Kitchen HYDROcodone-acetaminophen (NORCO/VICODIN) 5-325 MG per tablet Take 1 tablet by mouth every 6 (six) hours as needed for moderate pain.  10 tablet  0  . insulin glargine (LANTUS) 100 UNIT/ML injection Inject 60 Units into the skin at bedtime. For BS >200--sliding scale      . insulin lispro protamine-lispro (HUMALOG 75/25) (75-25) 100 UNIT/ML SUSP Inject 20-30 Units into the skin 3 (three) times daily with meals. Sliding scale      . losartan-hydrochlorothiazide (HYZAAR) 100-12.5 MG per tablet Take 1 tablet by mouth daily.      . metoCLOPramide (REGLAN) 10 MG tablet Take 1 tablet (10 mg total) by mouth every 8 (eight) hours as needed (headache with nausea).  20 tablet  0  . potassium chloride (K-DUR,KLOR-CON) 10 MEQ tablet Take 10 mEq by mouth daily.      . traZODone (DESYREL) 100 MG tablet Take 100 mg by mouth at bedtime as needed for sleep.       Marland Kitchen docusate sodium (COLACE) 100 MG capsule Take 1 capsule (100 mg total) by mouth 3 (three) times daily as needed for constipation.  30 capsule  0    Allergies: Allergies as of 11/25/2013  . (No Known Allergies)    PMH: Past Medical History  Diagnosis Date  . Diabetes mellitus   . Depression   . Constipation   . Fibromyalgia   . Anemia   . Anxiety     PSH: Past Surgical History  Procedure Laterality Date  . Knee surgery    . Cystectomy    . Shoulder surgery      right  . Endometrial ablation    .  Colonoscopy  02/26/2012    SLF: Internal hemorrhoids/TCS IN 10 YEARS WITH PROPOFOL  . Lumbar  laminectomy/decompression microdiscectomy N/A 05/14/2013    Procedure: RIGHT L4-L5 DECOMPRESSION AND CYST REMOVAL;  Surgeon: Melina Schools, MD;  Location: [facility];  Service: Orthopedics;  Laterality: N/A;    FH: Family History  Problem Relation Age of Onset  . Hypertension Mother   . Cancer Mother   . Asthma Sister   . Asthma Brother   . Stomach cancer      distant relative on dad's side of family  . Colon cancer Neg Hx     SH: History  Substance Use Topics  . Smoking status: Former Smoker -- 0.50 packs/day for 15 years    Types: Cigarettes    Quit date: 04/29/1991  . Smokeless tobacco: Never Used  . Alcohol Use: No     Comment: occ wine; not now    Review of Systems: Pertinent items are noted in HPI.  Physical Exam: Filed Vitals:   11/25/13 1730  BP: 131/51  Pulse: 108  Temp:   Resp:     Physical Exam Constitutional: Vital signs reviewed.  Patient is a well-developed and well-nourished female who is lying in bed appears uncomfortable with a mask over her eyes due to severe HA.   Head: Normocephalic and atraumatic Eyes: PERRL, EOMI, conjunctivae normal, no scleral icterus.  Neck: Supple, Trachea midline .  Cardiovascular: RRR, no MRG, pulses symmetric and intact bilaterally Pulmonary/Chest: normal respiratory effort, CTAB, no wheezes, rales, or rhonchi Abdominal: Soft.  Mild tenderness in epigastric area, bowel sounds are normal, no masses, organomegaly, or guarding present.  Musculoskeletal: No joint deformities, erythema, or stiffness Neurological: A&O x3, cranial nerve II-XII are grossly intact, no focal motor deficit' Skin: Warm, dry and intact. No rash, cyanosis, or clubbing.  Extremities: No peripheral edema Psychiatric: Normal mood and affect.   Lab results:  Basic Metabolic Panel:  Recent Labs  11/25/13 1418  NA 135*  K 5.9*  CL 87*  CO2 12*  GLUCOSE 696*  BUN 28*  CREATININE 0.87  CALCIUM 9.9   Anion Gap:  36  Calcium/Magnesium/Phosphorus:  Recent Labs Lab 11/25/13 1418  CALCIUM 9.9    Liver Function Tests:  Recent Labs  11/25/13 1418  AST 22  ALT 22  ALKPHOS 136*  BILITOT 0.3  PROT 7.5  ALBUMIN 3.6   No results found for this basename: LIPASE, AMYLASE,  in the last 72 hours No results found for this basename: AMMONIA,  in the last 72 hours  CBC: Lab Results  Component Value Date   WBC 7.8 11/25/2013   HGB 12.9 11/25/2013   HCT 39.8 11/25/2013   MCV 96.4 11/25/2013   PLT 522* 11/25/2013    Lipase: Lab Results  Component Value Date   LIPASE 14 04/29/2011    Lactic Acid/Procalcitonin:  Recent Labs Lab 11/25/13 1447  LATICACIDVEN 2.37*    Cardiac Enzymes: No results found for this basename: TROPIPOC,  in the last 72 hours Lab Results  Component Value Date   CKTOTAL 61 01/11/2010   CKMB 1.3 01/11/2010   TROPONINI  Value: 0.01        NO INDICATION OF MYOCARDIAL INJURY. 01/11/2010    BNP: No results found for this basename: PROBNP,  in the last 72 hours  D-Dimer: No results found for this basename: DDIMER,  in the last 72 hours  CBG:  Recent Labs  11/25/13 1413 11/25/13 1626 11/25/13 1724  GLUCAP >600* 552* 551*  Hemoglobin A1C: No results found for this basename: HGBA1C,  in the last 72 hours  Lipid Panel: No results found for this basename: CHOL, HDL, LDLCALC, TRIG, CHOLHDL, LDLDIRECT,  in the last 72 hours  Thyroid Function Tests: No results found for this basename: TSH, T4TOTAL, FREET4, T3FREE, THYROIDAB,  in the last 72 hours  Anemia Panel: No results found for this basename: VITAMINB12, FOLATE, FERRITIN, TIBC, IRON, RETICCTPCT,  in the last 72 hours  Coagulation: No results found for this basename: LABPROT, INR,  in the last 72 hours  Urine Drug Screen: Drugs of Abuse:     Component Value Date/Time   LABOPIA NONE DETECTED 04/14/2012 0535   COCAINSCRNUR NONE DETECTED 04/14/2012 0535   LABBENZ POSITIVE* 04/14/2012 0535   AMPHETMU NONE  DETECTED 04/14/2012 0535   THCU NONE DETECTED 04/14/2012 0535   LABBARB NONE DETECTED 04/14/2012 0535    Alcohol Level: No results found for this basename: ETH,  in the last 72 hours  Urinalysis:   Recent Labs  11/25/13 1549  COLORURINE YELLOW  LABSPEC 1.029  PHURINE 5.0  GLUCOSEU >1000*  HGBUR NEGATIVE  BILIRUBINUR NEGATIVE  KETONESUR >80*  PROTEINUR NEGATIVE  UROBILINOGEN 0.2  NITRITE NEGATIVE  LEUKOCYTESUR NEGATIVE    Imaging results:  No results found.  EKG: EKG Interpretation  Date/Time:    Ventricular Rate:    PR Interval:    QRS Duration:   QT Interval:    QTC Calculation:   R Axis:     Text Interpretation:     Orders placed during the hospital encounter of 11/25/13  . EKG 12-LEAD  . EKG 12-LEAD     Antibiotics: Antibiotics Given (last 72 hours)   None      Anti-infectives   None      Consults:    Assessment & Plan by Problem: Principal Problem:   DKA (diabetic ketoacidoses) Active Problems:   DM (diabetes mellitus) type 2, uncontrolled, with ketoacidosis   Nausea and vomiting   HTN (hypertension)  Pt 54 yo female with PMH DKA who presents to ED with N/V and CP.   #DKA -  Pt presents with symptoms c/w DKA and has previous h/o.  She has elevated serum glucose, ketonuria, and anion gap metabolic acidosis.  She has had previous episodes in the past.  Lactic acid also elevated but likely D-lactic acid; pt is not on home metformin.  Most likely d/t non-adherence with insulin.   -admit to SDU -NPO -IVF -DKA protocol  -diabetes education  -consult care management for medication needs  #Atypical Chest Pain -   Pt has risk factors for ACS including DMII, HTN, dyslipidemia.  ACS should be ruled out.  TIMI score: 0.  Other diagnoses considered include: PE (Wells score:1.5 ,no previous h/o, VS to suggest PE, recent travel hx, malignancy, OCP use), Pneumothorax (no pleuritic CP or onset acute), Pneumonia (no rigors, fever, cough, pleuritic CP,  or leukocytosis), Aortic dissection (pain not described as acute, tearing/ripping, no widening of mediastinum on CXR), Pericarditis (no pleuritic CP, diffuse ST elevation on EKG, or friction rub), GI etiology (denies symptoms of dyspepsia, regurgitation, acid taste, no h/o PPI use), Anxiety (pt denies any recent stressors, no h/o).  However, likely GERD given description of burning character and belching.   -troponins X3 -O2 prn -ASA 81mg  -HA1c -continue statin when tolerating po -EKG prn chest pain  #Headache - Pt c/o HA since Friday. -toradol x 1  #Hypertension -  Pt reports not taking her BP meds today.  She is on HCTZ-losartan 100-12.5mg .   -continue home meds   #Uncontrolled Diabetes Mellitus II -   Pt noncompliant with insulin for one week.  Lab Results  Component Value Date   HGBA1C 10.2* 05/14/2013   -HA1C -d/c home meds -DKA protocol per above -resume home dose insulin when gap closes  -ac and hs cbg (once off insulin gtt) -consult to diabetes education   #GERD -  -on chronic PPI; continue home meds  #Dyslipidemia -   Lab Results  Component Value Date   CHOL  Value: 162        ATP III CLASSIFICATION:  <200     mg/dL   Desirable  200-239  mg/dL   Borderline High  >=240    mg/dL   High        09/03/2008   HDL 60 09/03/2008   LDLCALC  Value: 88        Total Cholesterol/HDL:CHD Risk Coronary Heart Disease Risk Table                     Men   Women  1/2 Average Risk   3.4   3.3  Average Risk       5.0   4.4  2 X Average Risk   9.6   7.1  3 X Average Risk  23.4   11.0        Use the calculated Patient Ratio above and the CHD Risk Table to determine the patient's CHD Risk.        ATP III CLASSIFICATION (LDL):  <100     mg/dL   Optimal  100-129  mg/dL   Near or Above                    Optimal  130-159  mg/dL   Borderline  160-189  mg/dL   High  >190     mg/dL   Very High 09/03/2008   TRIG 70 09/03/2008   CHOLHDL 2.7 09/03/2008   -continue statin therapy when tolerating  diet  #Depression/anxiety - Pt appears stable.  -will add valium when tolerating po   #FEN-  Fluids-per above Electrolytes-  Hyponatremia - Most likely d/t elevated glucose; will resolve once DKA treated Nutrition- NPO for now  #VTE prophylaxis -  lovenox 40mg  SQ qd  #Dispo - Disposition deferred at this time, awaiting improvement of current medical problems. Anticipated discharge in approximately 1-2 day(s).    Emergency Contact: Contact Information   Name Relation Home Work [place] Daughter [phone]        The patient does have a current PCP (Provider Default, MD) and does need an [facility] hospital follow-up appointment after discharge.  Signed: Jones Bales, MD PGY-1, Internal Medicine Teaching Service [phone] (7AM-5PM Mon-Fri) 11/25/2013, 7:18 PM

## 2013-11-25 NOTE — ED Notes (Signed)
NOTIFIED BE[facility]ER ,[place]. FOR PATIENTS LAB RESULTS OF I-STAT G3+ VENOUS BLOOD GAS ,@16 :18 PM ,11/25/2013.

## 2013-11-25 NOTE — ED Notes (Signed)
Per pt sts she hasn't had insulin in 1 week. Pt CBG >600. sts nausea, HA, tachy. sts she has been drinking a lot of water to keep hydrated.

## 2013-11-25 NOTE — ED Notes (Signed)
NOTIFIED DR. Mingo Amber OF PATIENTS LAB RESULTS OF CG4+ LACTIC ACID @14 :54 PM ,11/25/2013.

## 2013-11-26 ENCOUNTER — Encounter (HOSPITAL_COMMUNITY): Payer: Self-pay | Admitting: Internal Medicine

## 2013-11-26 DIAGNOSIS — IMO0001 Reserved for inherently not codable concepts without codable children: Secondary | ICD-10-CM

## 2013-11-26 DIAGNOSIS — E1165 Type 2 diabetes mellitus with hyperglycemia: Secondary | ICD-10-CM

## 2013-11-26 LAB — GLUCOSE, CAPILLARY
GLUCOSE-CAPILLARY: 121 mg/dL — AB (ref 70–99)
GLUCOSE-CAPILLARY: 137 mg/dL — AB (ref 70–99)
GLUCOSE-CAPILLARY: 159 mg/dL — AB (ref 70–99)
GLUCOSE-CAPILLARY: 205 mg/dL — AB (ref 70–99)
GLUCOSE-CAPILLARY: 212 mg/dL — AB (ref 70–99)
GLUCOSE-CAPILLARY: 87 mg/dL (ref 70–99)
Glucose-Capillary: 168 mg/dL — ABNORMAL HIGH (ref 70–99)
Glucose-Capillary: 168 mg/dL — ABNORMAL HIGH (ref 70–99)
Glucose-Capillary: 186 mg/dL — ABNORMAL HIGH (ref 70–99)
Glucose-Capillary: 140 mg/dL — ABNORMAL HIGH (ref 70–99)
Glucose-Capillary: 131 mg/dL — ABNORMAL HIGH (ref 70–99)
Glucose-Capillary: 158 mg/dL — ABNORMAL HIGH (ref 70–99)
Glucose-Capillary: 164 mg/dL — ABNORMAL HIGH (ref 70–99)
Glucose-Capillary: 136 mg/dL — ABNORMAL HIGH (ref 70–99)
Glucose-Capillary: 142 mg/dL — ABNORMAL HIGH (ref 70–99)
Glucose-Capillary: 64 mg/dL — ABNORMAL LOW (ref 70–99)
Glucose-Capillary: 79 mg/dL (ref 70–99)

## 2013-11-26 LAB — LACTIC ACID, PLASMA: Lactic Acid, Venous: 1 mmol/L (ref 0.5–2.2)

## 2013-11-26 LAB — BASIC METABOLIC PANEL
BUN: 17 mg/dL (ref 6–23)
BUN: 12 mg/dL (ref 6–23)
BUN: 10 mg/dL (ref 6–23)
CHLORIDE: 105 meq/L (ref 96–112)
CHLORIDE: 106 meq/L (ref 96–112)
CO2: 18 meq/L — AB (ref 19–32)
CO2: 21 meq/L (ref 19–32)
CO2: 19 mEq/L (ref 19–32)
CREATININE: 0.61 mg/dL (ref 0.50–1.10)
Calcium: 8.6 mg/dL (ref 8.4–10.5)
Calcium: 8.5 mg/dL (ref 8.4–10.5)
Calcium: 8.4 mg/dL (ref 8.4–10.5)
Chloride: 104 mEq/L (ref 96–112)
Creatinine, Ser: 0.66 mg/dL (ref 0.50–1.10)
Creatinine, Ser: 0.65 mg/dL (ref 0.50–1.10)
GFR calc Af Amer: >90 mL/min (ref >90)
GFR calc Af Amer: >90 mL/min (ref >90)
GFR calc non Af Amer: >90 mL/min (ref >90)
GFR calc non Af Amer: >90 mL/min (ref >90)
GFR calc non Af Amer: >90 mL/min (ref >90)
GLUCOSE: 159 mg/dL — AB (ref 70–99)
Glucose, Bld: 208 mg/dL — ABNORMAL HIGH (ref 70–99)
Glucose, Bld: 158 mg/dL — ABNORMAL HIGH (ref 70–99)
Potassium: 5 mEq/L (ref 3.7–5.3)
Potassium: 3.9 mEq/L (ref 3.7–5.3)
Potassium: 4.1 mEq/L (ref 3.7–5.3)
SODIUM: 138 meq/L (ref 137–147)
Sodium: 136 mEq/L — ABNORMAL LOW (ref 137–147)
Sodium: 139 mEq/L (ref 137–147)

## 2013-11-26 LAB — CBC
HEMATOCRIT: 33.4 % — AB (ref 36.0–46.0)
Hemoglobin: 10.6 g/dL — ABNORMAL LOW (ref 12.0–15.0)
MCH: 30.4 pg (ref 26.0–34.0)
MCHC: 31.7 g/dL (ref 30.0–36.0)
MCV: 95.7 fL (ref 78.0–100.0)
PLATELETS: 427 10*3/uL — AB (ref 150–400)
RBC: 3.49 MIL/uL — ABNORMAL LOW (ref 3.87–5.11)
RDW: 14.7 % (ref 11.5–15.5)
WBC: 7.4 10*3/uL (ref 4.0–10.5)

## 2013-11-26 LAB — HEMOGLOBIN A1C
Hgb A1c MFr Bld: 10.4 % — ABNORMAL HIGH (ref <5.7)
Mean Plasma Glucose: 252 mg/dL — ABNORMAL HIGH (ref <117)

## 2013-11-26 LAB — TROPONIN I
Troponin I: <0.3 ng/mL (ref <0.30)
Troponin I: <0.3 ng/mL (ref <0.30)

## 2013-11-26 MED ORDER — ATORVASTATIN CALCIUM 40 MG PO TABS
40.0000 mg | ORAL_TABLET | Freq: Every day | ORAL | Status: DC
  Administered 2013-11-26: 40 mg via ORAL
  Filled 2013-11-26 (×3): qty 1

## 2013-11-26 MED ORDER — INSULIN ASPART PROT & ASPART (70-30 MIX) 100 UNIT/ML ~~LOC~~ SUSP
30.0000 [IU] | Freq: Two times a day (BID) | SUBCUTANEOUS | Status: DC
  Administered 2013-11-26 – 2013-11-27 (×3): 30 [IU] via SUBCUTANEOUS
  Filled 2013-11-26: qty 10

## 2013-11-26 MED ORDER — INSULIN ASPART 100 UNIT/ML ~~LOC~~ SOLN
0.0000 [IU] | Freq: Every day | SUBCUTANEOUS | Status: DC
Start: 2013-11-26 — End: 2013-11-27

## 2013-11-26 MED ORDER — SODIUM CHLORIDE 0.9 % IV BOLUS (SEPSIS)
1000.0000 mL | Freq: Once | INTRAVENOUS | Status: AC
  Administered 2013-11-26: 1000 mL via INTRAVENOUS

## 2013-11-26 MED ORDER — INSULIN ASPART 100 UNIT/ML ~~LOC~~ SOLN
0.0000 [IU] | Freq: Three times a day (TID) | SUBCUTANEOUS | Status: DC
  Administered 2013-11-26: 5 [IU] via SUBCUTANEOUS
  Administered 2013-11-27: 8 [IU] via SUBCUTANEOUS
  Administered 2013-11-27: 3 [IU] via SUBCUTANEOUS

## 2013-11-26 NOTE — Progress Notes (Signed)
Called report to Alexis, RN on 6N.  Updated on patient history, current status, and plan of care.  Patient is stable and able to transfer at this time.

## 2013-11-26 NOTE — Progress Notes (Signed)
CSW consulted for insurance needs. CSW called financial counselor and left voicemail asking them to follow up. CSW signing off.   Ky Barban, MSW, [facility] Clinical Social Worker [phone]

## 2013-11-26 NOTE — Progress Notes (Addendum)
Subjective:   VSS.  Pt looks much better this AM and reports feeling better. She still reports having some burning epigastric pain.  She is concerned about getting her insulin once she is discharged and we will help her with this.    Objective:   Vital signs in last 24 hours: Filed Vitals:   11/26/13 0600 11/26/13 0700 11/26/13 0818 11/26/13 1133  BP: 116/58 113/59 136/71 142/67  Pulse: 87 83 87 83  Temp:   97.4 F (36.3 C) 98.2 F (36.8 C)  TempSrc:   Oral Oral  Resp: 16 15 13 21   Height:      Weight:      SpO2: 96% 95% 97% 98%   Weight change:   Intake/Output Summary (Last 24 hours) at 11/26/13 1342 Last data filed at 11/26/13 1136  Gross per 24 hour  Intake      0 ml  Output   2450 ml  Net  -2450 ml    Physical Exam: Constitutional: Vital signs reviewed.  Patient is in no acute distress and cooperative with exam. Head: Normocephalic and atraumatic Eyes: PERRL, EOMI, conjunctivae normal, no scleral icterus  Neck: Supple, Trachea midline Cardiovascular: RRR, no MRG, DP 2+ b/l Pulmonary/Chest: normal respiratory effort, CTAB, no wheezes, rales, or rhonchi Abdominal: Soft. +BS, mild epigastric tenderness, non-distended Musculoskeletal: No joint deformities, erythema, or stiffness Neurological: A&O x3, cranial nerve II-XII are grossly intact, moving all extremities  Skin: Warm, dry and intact. No rash Psychiatric: Normal mood and affect.   Lab Results:  BMP:  Recent Labs Lab 11/26/13 0734 11/26/13 1115  NA 138 139  K 3.9 4.1  CL 104 106  CO2 21 19  GLUCOSE 158* 159*  BUN 12 10  CREATININE 0.65 0.61  CALCIUM 8.5 8.4   Anion Gap: 14   CBC:  Recent Labs Lab 11/25/13 1418 11/26/13 0341  WBC 7.8 7.4  HGB 12.9 10.6*  HCT 39.8 33.4*  MCV 96.4 95.7  PLT 522* 427*    Coagulation: No results found for this basename: LABPROT, INR,  in the last 168 hours  CBG:            Recent Labs Lab 11/26/13 0657 11/26/13 0804 11/26/13 0906  11/26/13 1001 11/26/13 1115 11/26/13 1210  GLUCAP 131* 158* 164* 136* 142* 87           HA1C:      No results found for this basename: HGBA1C,  in the last 168 hours  Lipid Panel: No results found for this basename: CHOL, HDL, LDLCALC, TRIG, CHOLHDL, LDLDIRECT,  in the last 168 hours  LFTs:  Recent Labs Lab 11/25/13 1418  AST 22  ALT 22  ALKPHOS 136*  BILITOT 0.3  PROT 7.5  ALBUMIN 3.6    Pancreatic Enzymes: No results found for this basename: LIPASE, AMYLASE,  in the last 168 hours  Lactic Acid/Procalcitonin:  Recent Labs Lab 11/25/13 1447 11/25/13 2115 11/26/13 0925  LATICACIDVEN 2.37* 2.7* 1.0    Ammonia: No results found for this basename: AMMONIA,  in the last 168 hours  Cardiac Enzymes:  Recent Labs Lab 11/25/13 1940 11/26/13 0110 11/26/13 0734  TROPONINI <0.30 <0.30 <0.30   Lab Results  Component Value Date   CKTOTAL 61 01/11/2010   CKMB 1.3 01/11/2010   TROPONINI <0.30 11/26/2013    EKG: EKG Interpretation  Date/Time:    Ventricular Rate:    PR Interval:    QRS Duration:   QT Interval:    QTC Calculation:  R Axis:     Text Interpretation:     BNP: No results found for this basename: PROBNP,  in the last 168 hours  D-Dimer: No results found for this basename: DDIMER,  in the last 168 hours  Urinalysis:  Recent Labs Lab 11/25/13 1549  COLORURINE YELLOW  LABSPEC 1.029  PHURINE 5.0  GLUCOSEU >1000*  HGBUR NEGATIVE  BILIRUBINUR NEGATIVE  KETONESUR >80*  PROTEINUR NEGATIVE  UROBILINOGEN 0.2  NITRITE NEGATIVE  LEUKOCYTESUR NEGATIVE    Micro Results: Recent Results (from the past 240 hour(s))  MRSA PCR SCREENING     Status: None   Collection Time    11/25/13  6:34 PM      Result Value Ref Range Status   MRSA by PCR NEGATIVE  NEGATIVE Final   Comment:            The GeneXpert MRSA Assay (FDA     approved for NASAL specimens     only), is one component of a     comprehensive MRSA colonization     surveillance  program. It is not     intended to diagnose MRSA     infection nor to guide or     monitor treatment for     MRSA infections.    Blood Culture:    Component Value Date/Time   SDES URINE, CLEAN [facility] 05/16/2013 2126   [place] ADDED 2232 05/16/2013 2126   CULT  Value: LACTOBACILLUS SPECIES Note: Standardized susceptibility testing for this organism is not available. Performed at [facility] 05/16/2013 2126   REPTSTATUS 05/18/2013 FINAL 05/16/2013 2126    Studies/Results: No results found.  Medications:  Scheduled Meds: . aspirin EC  81 mg Oral Daily  . atorvastatin  40 mg Oral QHS  . enoxaparin (LOVENOX) injection  40 mg Subcutaneous Q24H  . losartan  100 mg Oral Daily   And  . hydrochlorothiazide  12.5 mg Oral Daily  . insulin aspart  0-15 Units Subcutaneous TID WC  . insulin aspart  0-5 Units Subcutaneous QHS  . insulin aspart protamine- aspart  30 Units Subcutaneous BID WC  . pantoprazole  40 mg Oral Daily   Continuous Infusions: . sodium chloride 125 mL/hr at 11/26/13 0905   PRN Meds:.dextrose, diazepam, gi cocktail, HYDROcodone-acetaminophen, promethazine, traZODone  Antibiotics: Anti-infectives   None     Antibiotics Given (last 72 hours)   None      Day of Hospitalization:  1 Consults:    Assessment/Plan:   Principal Problem:   DKA (diabetic ketoacidoses) Active Problems:   DM (diabetes mellitus) type 2, uncontrolled, with ketoacidosis   HTN (hypertension)   Headache(784.0)   Chest pain  #DKA -  Resolved.  Pt presented with symptoms c/w DKA.  She has elevated serum glucose, ketonuria, and anion gap metabolic acidosis. She has had previous episodes in the past. Lactic acid also elevated but likely D-lactic acid; pt is not on home metformin. Most likely d/t non-adherence with insulin.  5/6: HA1c 10.4. -transfer from SDU to med-surg -carb modified diet -IVF  -d/c DKA protocol  -diabetes education  -consult care management for  medication needs   #Atypical Chest Pain -  Trops x 3 neg.    -ASA 81mg   -continue statin  -EKG prn chest pain   #Headache -  Improved s/p toradol.  Pt c/o HA since Friday.   #Hypertension -  She is on HCTZ-losartan 100-12.5mg  at home.   -continue home meds   #Uncontrolled Diabetes Mellitus II -  Pt noncompliant with insulin for one week due to pt losing job.   Lab Results  Component Value Date   HGBA1C 10.4* 11/25/2013  -d/c home meds  -d/c DKA protocol  -ac and hs cbg   -consult to diabetes education   #GERD -  -on chronic PPI; continue home meds   #Dyslipidemia -  Lab Results   Component  Value  Date    CHOL  Value: 162 ATP III CLASSIFICATION: <200 mg/dL Desirable 200-239 mg/dL Borderline High >=240 mg/dL High  09/03/2008    HDL  60  09/03/2008    LDLCALC  Value: 88 Total Cholesterol/HDL:CHD Risk Coronary Heart Disease Risk Table Men Women 1/2 Average Risk 3.4 3.3 Average Risk 5.0 4.4 2 X Average Risk 9.6 7.1 3 X Average Risk 23.4 11.0 Use the calculated Patient Ratio above and the CHD Risk Table to determine the patient's CHD Risk. ATP III CLASSIFICATION (LDL): <100 mg/dL Optimal 100-129 mg/dL Near or Above Optimal 130-159 mg/dL Borderline 160-189 mg/dL High >190 mg/dL Very High  09/03/2008    TRIG  70  09/03/2008    CHOLHDL  2.7  09/03/2008    -continue statin therapy   #Depression/anxiety -  Pt appears stable.  -valium   #FEN-  Fluids- NS boluses given Electrolytes- Replete as needed Hyponatremia - Most likely d/t elevated glucose; will resolve once DKA treated  Nutrition- Carb modified  #VTE prophylaxis -  lovenox 40mg  SQ qd  #Dispo- Anticipated discharge tomorrow.  Transfer from SDU to med-surg today.   LOS: 1 day   Jones Bales, MD PGY-1, Internal Medicine Teaching Service [phone] (7AM-5PM Mon-Fri) 11/26/2013, 1:42 PM

## 2013-11-26 NOTE — Progress Notes (Signed)
I repeated the critical or key portions of the exam.  I confirmed/revised the medical student's history, exam, assessment and plan.

## 2013-11-26 NOTE — ED Provider Notes (Signed)
This patient was seen in conjunction with the resident physician. The documentation accurately reflects the patient's encounter in the emergency department, with the following additions:  On my exam the patient was uncomfortable appearing.  She presented to 2 concerns of hyperglycemia with no recent medication use, and ongoing polyuria, polydipsia, headache, nausea. And immediately the patient's hyperglycemia was found, and subsequently she was found to have evidence for DKA, with acidosis, anion gap. Patient had received initial IV fluid resuscitation, and this was continued, and the patient required insulin drip to address her DKA. Patient required admission for the acidosis and ongoing resuscitation.   CRITICAL CARE Performed by: Carmin Muskrat Total critical care time: 30 Critical care time was exclusive of separately billable procedures and treating other patients. Critical care was necessary to treat or prevent imminent or life-threatening deterioration. Critical care was time spent personally by me on the following activities: development of treatment plan with patient and/or surrogate as well as nursing, discussions with consultants, evaluation of patient's response to treatment, examination of patient, obtaining history from patient or surrogate, ordering and performing treatments and interventions, ordering and review of laboratory studies, ordering and review of radiographic studies, pulse oximetry and re-evaluation of patient's condition.   Carmin Muskrat, MD 11/26/13 [phone]

## 2013-11-26 NOTE — Progress Notes (Signed)
Inpatient Diabetes Program Recommendations  AACE/ADA: New Consensus Statement on Inpatient Glycemic Control (2013)  Target Ranges:  Prepandial:   less than 140 mg/dL      Peak postprandial:   less than 180 mg/dL (1-2 hours)      Critically ill patients:  140 - 180 mg/dL   Results for [name], [name] (MRN [id number]) as of 11/26/2013 07:30  Ref. Range 11/26/2013 00:00 11/26/2013 01:14 11/26/2013 02:31 11/26/2013 03:42 11/26/2013 06:57  Glucose-Capillary Latest Range: 70-99 mg/dL 121 (H) 205 (H) 186 (H) 140 (H) 131 (H)  Results for [name], [name] (MRN [id number]) as of 11/26/2013 07:30  Ref. Range 11/26/2013 01:10  CO2 Latest Range: 19-32 mEq/L 18 (L)   Diabetes history: DM2 Outpatient Diabetes medications: Lantus 60 units QHS (pt reports sometimes split into 2 doses and uses 30 units BID), Humalog 75/25 5-10 units TID with meals (5 units if CBG <200 or 10 units if CBG >200) Current orders for Inpatient glycemic control: Currently on DKA protocol per [facility]  Inpatient Diabetes Program Recommendations Insulin - IV drip/GlucoStabilizer: According to last labs in the computer from 11/26/13 @ 1:10 am CO2 18 and AG 13.  Therefore, patient still requiring IV insulin at this time. Please order BMET Q4H until acidosis is resolved and patient is transitioned off IV insulin.  Insulin - Basal: Once transition criteria is met, please consider ordering 70/30 30 units BID (based on 85.8 kg x 0.4 units and eqivalent to Lantus 34 units daily). Correction (SSI): Once transition criteria is met, please consider ordering Novolog moderate correction Q4H. Diet: When diet ordered, please consider ordering Carb Modified Diabetic diet.  Note: Patient has a history of diabetes and is prescribed Lantus 60 units QHS (pt reports she sometimes splits Lantus into 2 doses and uses 30 units BID) and Humalog 75/25 5-10 units TID with meals (5 units if CBG <200 or 10 units if CBG >200).  Patient was admitted with DKA.  In  talking with the patient she ran out of Lantus and Humalog.  She reports that she lost her job and has been having difficulty affording her insulins. She states that her copay with insurance for Lantus is $150 per vial and for Humalog 75/25 is $50 per vial.  Patient reports that she was using her daughter's Novolog insulin to try to manage her diabetes but found that Novolog could not keep her blood sugar controlled properly.  Explained difference in Lantus, Humalog 75/25, and Novolog insulin (onset and duration). Also discussed DKA pathophysiology and stressed importance of insulin for basal and bolus needs to maintain glycemic control.  Patient states that she was trying to watch her diet closely to help with blood sugar control but "diet and Novolog just wasn't working to keep her diabetes controlled".  Patient is followed by her PCP for diabetes management and she states that her A1C was down to 8.0% when it was last checked a few months ago at her PCP office. Informed patient about insulin manufacture patient medication assistance programs and encouraged patient to go on-line and fill out paperwork to apply for programs to see if she qualifies for free insulin.  Informed patient about Novolin N, Novolin 70/30, and Novolin R which can be purchased over the counter at [facility] for $24.88 per vial.  Patient states that she will talk with her children and see if they could help her with the cost of insulin from [employer].  Stressed importance of glycemic control to prevent acute and chronic complication related  to uncontrolled diabetes. There is already and order for Case Management consult to assist with medication needs also. Patient verbalized understanding of information discussed and very appreciative of information.   In reviewing the last labs drawn 11/26/13 at 1:10 am, IV insulin is still required since acidosis has not resolved.  Patient reports that lab came "a little bit ago" and drew blood. If BMET has  not been drawn, please order BMET now and Q4H until acidosis is resolved. Once criteria for transition from IV to SQ insulin is met per DKA protocol, recommend not using Lantus due to the cost and financial strain of patient's current situation.  Therefore at time of transition, recommend ordering 70/30 30 units BID (based on 85.8 kg x 0.4 units and is equivalent to Lantus 34 units daily) for basal needs and Novolog moderate correction scale Q4H.  Will continue to follow and make recommendations as more data is available.        Thanks, Barnie Alderman, RN, MSN, CCRN Diabetes Coordinator Inpatient Diabetes Program [phone] (Team Pager) [phone] ([facility] office) [phone] [facility] office)

## 2013-11-26 NOTE — Progress Notes (Signed)
Pt has an appointment for orange card application on Dec 15, 936 @ 4:00 p.m. @ [facility].  At that time, an appointment will be arranged for an MD visit.

## 2013-11-26 NOTE — H&P (Signed)
  Date: 11/26/2013  Patient name: [name]  Medical record number: [id number]  Date of birth: 07-Feb-1961   I have seen and evaluated Gweneth Dimitri and discussed their care with the Residency Team. Ms Allyne Gee has type II DM insulin dependent. She has had one episode of severe DKA previously (was unconscious). She recently lost her job. Still has her independent health insurance but unable to afford her lantus and novolog co pays and has been without her insulin for about one week.   On admission, her bicarb was 12, her gap 36, glucose 696, K 5.9. She was appropriately tx with the DKA protocol.   She has since had closure of her gap and normalization of her bicarb. However, now that is off insulin gtt, her bicarb decreased from 21 to 19 and her gap increased from 13 to 14. This may be insignificant but will need followed.   Assessment and Plan: I have seen and evaluated the patient as outlined above. I agree with the formulated Assessment and Plan as detailed in the residents' admission note, with the following changes:   1. DKA - cont to follow bicarb although freq may be decreased. Her I/O are inaccurate so do not know how many liters up she might be. Start diet. Transfer out of step down.   2. DM II - A1C is pending. Pt will be changed to 70/30 as can afford it at [facility]. Care mgmt to assist with meds / orange card / new MD.  3. CP - trop I negative x3.   Bartholomew Crews, MD 5/7/20152:32 PM

## 2013-11-26 NOTE — Care Management Note (Signed)
    Page 1 of 1   11/26/2013     12:34:53 PM CARE MANAGEMENT NOTE 11/26/2013  Patient:  [name], [name]   Account Number:  [id number]  Date Initiated:  11/26/2013  Documentation initiated by:  Daman Steffenhagen  Subjective/Objective Assessment:   dx DKA    PCP  Gwenlyn Perking with [place] consult  Medication [facility]      Per UR Regulation:  Reviewed for med. necessity/level of care/duration of stay  Comments:  11/26/13 0910 [place] Pt lost her job but was paying privately for her insurance and is still covered.  Currently has a $200 copay for her insulin.  CM talked with [facility] and [place] staff and they offered to work with pt by providing insulin samples, assisting with orange card application and applying for pharmaceutical patient assistance programs.  Pt would like to switch to [facility] - voice mail message left requesting appt with MD and with pharmacy for application for orange card.  CM found that pt will not qualify for [employer] patient assistance through pharmaceutical company because she has private prescription coverage.  [facility] pharmacy carries Humulin N, 70/30, and regular insulin.

## 2013-11-26 NOTE — Progress Notes (Signed)
Subjective:Patient is lying comfortably in her bed. Feeling much better. Still have mild burning epigastric pain. No N/V, headache . C/O mild leg cramps over night. Objective: Vital signs in last 24 hours: Filed Vitals:   11/26/13 0600 11/26/13 0700 11/26/13 0818 11/26/13 1133  BP: 116/58 113/59 136/71 142/67  Pulse: 87 83 87 83  Temp:   97.4 F (36.3 C) 98.2 F (36.8 C)  TempSrc:   Oral Oral  Resp: 16 15 13 21   Height:      Weight:      SpO2: 96% 95% 97% 98%   Weight change:   Intake/Output Summary (Last 24 hours) at 11/26/13 1147 Last data filed at 11/26/13 1136  Gross per 24 hour  Intake      0 ml  Output   2450 ml  Net  -2450 ml   BP 142/67  Pulse 83  Temp(Src) 98.2 F (36.8 C) (Oral)  Resp 21  Ht 5\' 5"  (1.651 m)  Wt 85.8 kg (189 lb 2.5 oz)  BMI 31.48 kg/m2  SpO2 98%  General Appearance:    Alert, cooperative, no distress, appears stated age  Head:    Normocephalic, without obvious abnormality, atraumatic  Throat:   Lips, mucosa, and tongue normal; teeth and gums normal  Neck:   Supple, symmetrical, trachea midline, no adenopathy;    thyroid:  no enlargement/tenderness/nodules; no carotid   bruit or JVD  Back:     Symmetric, no curvature, ROM normal, no CVA tenderness  Lungs:     Clear to auscultation bilaterally, respirations unlabored  Chest Wall:    No tenderness or deformity   Heart:    Regular rate and rhythm, S1 and S2 normal, no murmur, rub   or gallop     Abdomen:     Soft, non-tender, bowel sounds active all four quadrants,    no masses, no organomegaly  Extremities:   Extremities normal, atraumatic, no cyanosis or edema  Pulses:   2+ and symmetric all extremities  Skin:   Skin color, texture, turgor normal, no rashes or lesions  Lymph nodes:   Cervical, supraclavicular, and axillary nodes normal  Neurologic:   CNII-XII intact, normal strength, sensation and reflexes    throughout   Lab Results: @LABTEST2 @ Micro Results: Recent Results (from  the past 240 hour(s))  MRSA PCR SCREENING     Status: None   Collection Time    11/25/13  6:34 PM      Result Value Ref Range Status   MRSA by PCR NEGATIVE  NEGATIVE Final   Comment:            The GeneXpert MRSA Assay (FDA     approved for NASAL specimens     only), is one component of a     comprehensive MRSA colonization     surveillance program. It is not     intended to diagnose MRSA     infection nor to guide or     monitor treatment for     MRSA infections.   Studies/Results: No results found. Medications:  Scheduled Meds: . aspirin EC  81 mg Oral Daily  . atorvastatin  40 mg Oral QHS  . enoxaparin (LOVENOX) injection  40 mg Subcutaneous Q24H  . losartan  100 mg Oral Daily   And  . hydrochlorothiazide  12.5 mg Oral Daily  . insulin aspart  0-15 Units Subcutaneous TID WC  . insulin aspart  0-5 Units Subcutaneous QHS  . insulin aspart protamine- aspart  30 Units Subcutaneous BID WC  . pantoprazole  40 mg Oral Daily   Continuous Infusions: . sodium chloride 125 mL/hr at 11/26/13 0905  . insulin (NOVOLIN-R) infusion 0.8 Units/hr (11/26/13 1115)   PRN Meds:.dextrose, diazepam, gi cocktail, HYDROcodone-acetaminophen, promethazine, traZODone Assessment/Plan: Principal Problem:   DKA (diabetic ketoacidoses) Active Problems:   DM (diabetes mellitus) type 2, uncontrolled, with ketoacidosis   HTN (hypertension)   Headache(784.0)   Chest pain  DKA: Improved, her last hco2 was 21 with AG of 13. Give her 1 more litre of fluid, and try to switch her to oral and her routine insulin regiem. Uncontrolled DM: Get a care manager involved to get her insulin regularly at a affordable price. HTN: controlled, continue with current meds. Chest Pain: improved, EKG shows right axis deviation. Troponin normal. May consider out patient stress test due to her high risk. Her lactic acid is still high , need to repeat later today. Her K is dropping as her DKA is being corrected  Should get  some replacement. This is a Careers information officer Note.  The care of the patient was discussed with Dr. Aundra Dubin and the assessment and plan formulated with their assistance.  Please see their attached note for official documentation of the daily encounter.   LOS: 1 day   Lorella Nimrod, Med Student 11/26/2013, 11:47 AM

## 2013-11-27 DIAGNOSIS — IMO0001 Reserved for inherently not codable concepts without codable children: Secondary | ICD-10-CM

## 2013-11-27 DIAGNOSIS — K5900 Constipation, unspecified: Secondary | ICD-10-CM

## 2013-11-27 DIAGNOSIS — R079 Chest pain, unspecified: Secondary | ICD-10-CM

## 2013-11-27 DIAGNOSIS — D649 Anemia, unspecified: Secondary | ICD-10-CM

## 2013-11-27 LAB — BASIC METABOLIC PANEL
BUN: 8 mg/dL (ref 6–23)
CALCIUM: 8.2 mg/dL — AB (ref 8.4–10.5)
CO2: 22 mEq/L (ref 19–32)
Chloride: 107 mEq/L (ref 96–112)
Creatinine, Ser: 0.5 mg/dL (ref 0.50–1.10)
Glucose, Bld: 216 mg/dL — ABNORMAL HIGH (ref 70–99)
POTASSIUM: 4.1 meq/L (ref 3.7–5.3)
SODIUM: 141 meq/L (ref 137–147)

## 2013-11-27 LAB — GLUCOSE, CAPILLARY
Glucose-Capillary: 252 mg/dL — ABNORMAL HIGH (ref 70–99)
Glucose-Capillary: 184 mg/dL — ABNORMAL HIGH (ref 70–99)

## 2013-11-27 MED ORDER — GABAPENTIN 300 MG PO CAPS: 300.0000 mg | ORAL_CAPSULE | Freq: Three times a day (TID) | ORAL | Status: DC

## 2013-11-27 MED ORDER — INSULIN ASPART PROT & ASPART (70-30 MIX) 100 UNIT/ML ~~LOC~~ SUSP: 30.0000 [IU] | Freq: Two times a day (BID) | SUBCUTANEOUS | Status: DC

## 2013-11-27 MED ORDER — MELOXICAM 15 MG PO TABS: 15.0000 mg | ORAL_TABLET | Freq: Every day | ORAL | Status: DC

## 2013-11-27 MED ORDER — OMEPRAZOLE 40 MG PO CPDR: 40.0000 mg | DELAYED_RELEASE_CAPSULE | Freq: Every day | ORAL | Status: DC

## 2013-11-27 NOTE — Discharge Instructions (Addendum)
Items required to complete an Eligibility Application   1. Picture ID (Can't be expired) 2. Current Bill to establish proof of residency 3. W-2 & Tax return (if self-employed include "Schedule C"), if not filing Form 4506 4. 4 current Pay stubs for this year 5. Printout of other income (Social security, unemployment, child support, workmen's comp) 6. Food stamp award letter, if receiving  7. Life Insurance (Need copy of the front page, showing name Ins Co. Name, and face amount). 8. Statement for pension, 401-K, IRS (needs to have current balance) 9. Tax Value for cars, houses, mobile homes, and land (Get from [facility] Tax Department) 10. Disability Paperwork (showing status of case) 11. College students: Print out of [place] received, tuition cost, books, etc. 12. If no Income: Games developer of support for free shelter, money, food, Social research officer, government.  Bring all that you can to your follow up appointment to start the process. Chest Pain (Nonspecific) It is often hard to give a specific diagnosis for the cause of chest pain. There is always a chance that your pain could be related to something serious, such as a heart attack or a blood clot in the lungs. You need to follow up with your caregiver for further evaluation. CAUSES   Heartburn.  Pneumonia or bronchitis.  Anxiety or stress.  Inflammation around your heart (pericarditis) or lung (pleuritis or pleurisy).  A blood clot in the lung.  A collapsed lung (pneumothorax). It can develop suddenly on its own (spontaneous pneumothorax) or from injury (trauma) to the chest.  Shingles infection (herpes zoster virus). The chest wall is composed of bones, muscles, and cartilage. Any of these can be the source of the pain.  The bones can be bruised by injury.  The muscles or cartilage can be strained by coughing or overwork.  The cartilage can be affected by inflammation and become sore (costochondritis). DIAGNOSIS  Lab tests or other  studies, such as X-rays, electrocardiography, stress testing, or cardiac imaging, may be needed to find the cause of your pain.  TREATMENT   Treatment depends on what may be causing your chest pain. Treatment may include:  Acid blockers for heartburn.  Anti-inflammatory medicine.  Pain medicine for inflammatory conditions.  Antibiotics if an infection is present.  You may be advised to change lifestyle habits. This includes stopping smoking and avoiding alcohol, caffeine, and chocolate.  You may be advised to keep your head raised (elevated) when sleeping. This reduces the chance of acid going backward from your stomach into your esophagus.  Most of the time, nonspecific chest pain will improve within 2 to 3 days with rest and mild pain medicine. HOME CARE INSTRUCTIONS   If antibiotics were prescribed, take your antibiotics as directed. Finish them even if you start to feel better.  For the next few days, avoid physical activities that bring on chest pain. Continue physical activities as directed.  Do not smoke.  Avoid drinking alcohol.  Only take over-the-counter or prescription medicine for pain, discomfort, or fever as directed by your caregiver.  Follow your caregiver's suggestions for further testing if your chest pain does not go away.  Keep any follow-up appointments you made. If you do not go to an appointment, you could develop lasting (chronic) problems with pain. If there is any problem keeping an appointment, you must call to reschedule. SEEK MEDICAL CARE IF:   You think you are having problems from the medicine you are taking. Read your medicine instructions carefully.  Your chest pain  does not go away, even after treatment.  You develop a rash with blisters on your chest. SEEK IMMEDIATE MEDICAL CARE IF:   You have increased chest pain or pain that spreads to your arm, neck, jaw, back, or abdomen.  You develop shortness of breath, an increasing cough, or you  are coughing up blood.  You have severe back or abdominal pain, feel nauseous, or vomit.  You develop severe weakness, fainting, or chills.  You have a fever. THIS IS AN EMERGENCY. Do not wait to see if the pain will go away. Get medical help at once. Call your local emergency services (911 in [place] Do not drive yourself to the hospital. MAKE SURE YOU:   Understand these instructions.  Will watch your condition.  Will get help right away if you are not doing well or get worse. Document Released: 04/18/2005 Document Revised: 10/01/2011 Document Reviewed: 02/12/2008 [facility] Patient Information 2014 [place].  Meloxicam tablets What is this medicine? MELOXICAM (mel OX i cam) is a non-steroidal anti-inflammatory drug (NSAID). It is used to reduce swelling and to treat pain. It may be used for osteoarthritis, rheumatoid arthritis, or juvenile rheumatoid arthritis. This medicine may be used for other purposes; ask your health care provider or pharmacist if you have questions. COMMON BRAND NAME(S): Mobic What should I tell my health care provider before I take this medicine? They need to know if you have any of these conditions: -asthma -cigarette smoker -coronary artery bypass graft (CABG) surgery within the past 2 weeks -drink more than 3 alcohol-containing drinks a day -heart disease or circulation problems such as heart failure or leg edema (fluid retention) -hemophilia or bleeding problems -high blood pressure -kidney disease -liver disease -stomach bleeding or ulcers -an unusual or allergic reaction to meloxicam, aspirin, other NSAIDs, other medicines, foods, dyes, or preservatives -pregnant or trying to get pregnant -breast-feeding How should I use this medicine? Take this medicine by mouth with a full glass of water. Follow the directions on the prescription label. Take this medicine in an upright or sitting position. If possible take bedtime doses at least 10 minutes  before lying down. You can take it with or without food. If it upsets your stomach, take it with food. Take your medicine at regular intervals. Do not take it more often than directed. A special MedGuide will be given to you by the pharmacist with each prescription and refill. Be sure to read this information carefully each time. Talk to your pediatrician regarding the use of this medicine in children. Special care may be needed. Elderly patients over 73 years old may have a stronger reaction to this medicine and need smaller doses. Overdosage: If you think you have taken too much of this medicine contact a poison control center or emergency room at once. NOTE: This medicine is only for you. Do not share this medicine with others. What if I miss a dose? If you miss a dose, take it as soon as you can. If it is almost time for your next dose, take only that dose. Do not take double or extra doses. What may interact with this medicine? -alcohol -aspirin -cidofovir -diuretics -lithium -medicines for high blood pressure -methotrexate -other drugs for inflammation like ketorolac, ibuprofen, and prednisone -pemetrexed -warfarin This list may not describe all possible interactions. Give your health care provider a list of all the medicines, herbs, non-prescription drugs, or dietary supplements you use. Also tell them if you smoke, drink alcohol, or use illegal drugs. Some  items may interact with your medicine. What should I watch for while using this medicine? Tell your doctor or healthcare professional if your pain does not get better. Talk to your doctor before taking another medicine for pain. Do not treat yourself. This medicine does not prevent heart attack or stroke. If you take aspirin to prevent heart attack or stroke, talk with your doctor or health care professional. Do not take medicines such as ibuprofen and naproxen with this medicine. Side effects such as stomach upset, nausea, or ulcers  may be more likely to occur. Many medicines available without a prescription should not be taken with this medicine. What side effects may I notice from receiving this medicine? Side effects that you should report to your doctor or health care professional as soon as possible: -black or bloody stools, blood in the urine or vomit -blurred vision -chest pain -difficulty breathing or wheezing -nausea or vomiting -skin rash, skin redness, blistering or peeling skin, hives, or itching -slurred speech or weakness on one side of the body -swelling of eyelids, throat, lips -unexplained weight gain or swelling -unusually weak or tired -yellowing of eyes or skin Side effects that usually do not require medical attention (report to your doctor or health care professional if they continue or are bothersome): -constipation or diarrhea -dizziness -gas or heartburn -stomach pain This list may not describe all possible side effects. Call your doctor for medical advice about side effects. You may report side effects to FDA at 1-800-FDA-1088. Where should I keep my medicine? Keep out of the reach of children. Store at room temperature between 15 and 30 degrees C (59 and 86 degrees F). Protect from moisture. Keep container tightly closed. Throw away any unused medicine after the expiration date. NOTE: This sheet is a summary. It may not cover all possible information. If you have questions about this medicine, talk to your doctor, pharmacist, or health care provider.  2014, Elsevier/Gold Standard. (2009-10-31 21:15:42)  Regular Insulin; Isophane Insulin (NPH) injection What is this medicine? REGULAR INSULIN: ISOPHANE INSULIN (REG yuh ler IN su lin; ISO fane IN su lin) is a human-made form of insulin. This medicine lowers the amount of sugar in your blood. It is a combination insulin that starts working about 30 minutes after it is injected and works for as long as 12 to 24 hours. This medicine may be used  for other purposes; ask your health care provider or pharmacist if you have questions. COMMON BRAND NAME(S): Humulin 50/50, Humulin 70/30, Novolin 70/30, ReliOn What should I tell my health care provider before I take this medicine? They need to know if you have any of these conditions: -episodes of hypoglycemia -kidney disease -liver disease -an unusual or allergic reaction to insulin, metacresol, other medicines, foods, dyes, or preservatives -pregnant or trying to get pregnant -breast-feeding How should I use this medicine? This medicine is for injection under the skin. Use exactly as directed. It is important to follow the directions given to you by your doctor or health care professional. Your doctor or health care professional will tell you how long to wait after you inject your dose before eating a meal. Most of the time, you should wait about 30 minutes. You will be taught how to use this medicine and how to adjust doses for activities and illness. Do not use more insulin than prescribed. Do not use more or less often than prescribed. Always check the appearance of your insulin before using it. This medicine should be  white and cloudy before mixing and uniformly cloudy after mixing. To mix, roll the vial gently 10 times in your hands. Do not use it if it is colored, if it has solid particles in it, or if it does not mix. It is important that you put your used needles and syringes in a special sharps container. Do not put them in a trash can. If you do not have a sharps container, call your pharmacist or healthcare provider to get one. Talk to your pediatrician regarding the use of this medicine in children. While this drug may be prescribed for selected conditions, precautions do apply. Overdosage: If you think you have taken too much of this medicine contact a poison control center or emergency room at once. NOTE: This medicine is only for you. Do not share this medicine with others. What  if I miss a dose? It is important not to miss a dose. Your health care professional or doctor should discuss a plan for missed doses with you. If you do miss a dose, follow their plan. Do not take double doses. What may interact with this medicine? -other medicines for diabetes Many medications may cause an increase or decrease in blood sugar, these include: -alcohol containing beverages -aspirin and aspirin-like drugs -chloramphenicol -chromium -diuretics -female hormones, like estrogens or progestins and birth control pills -heart medicines -isoniazid -MAOIs like Carbex, Eldepryl, Marplan, Nardil, and Parnate -female hormones or anabolic steroids -medicines for weight loss -medicines for allergies, asthma, cold, or cough -medicines for mental problems -niacin -NSAIDs, medicines for pain and inflammation, like ibuprofen or naproxen -pentamidine -phenytoin -probenecid -quinolone antibiotics like ciprofloxacin, levofloxacin, ofloxacin -some herbal dietary supplements -steroid medicines like prednisone or cortisone -thyroid medicine Some medications can hide the warning symptoms of low blood sugar. You may need to monitor your blood sugar more closely if you are taking one of these medications. These include: -beta-blockers such as atenolol, metoprolol, propranolol -clonidine -guanethidine -reserpine This list may not describe all possible interactions. Give your health care provider a list of all the medicines, herbs, non-prescription drugs, or dietary supplements you use. Also tell them if you smoke, drink alcohol, or use illegal drugs. Some items may interact with your medicine. What should I watch for while using this medicine? Visit your health care professional or doctor for regular checks on your progress. A test called the HbA1C (A1C) will be monitored. This is a simple blood test. It measures your blood sugar control over the last 2 to 3 months. You will receive this test  every 3 to 6 months. Learn how to check your blood sugar. Learn the symptoms of low and high blood sugar and how to manage them. Always carry a quick-source of sugar with you in case you have symptoms of low blood sugar. Examples include hard sugar candy or glucose tablets. Make sure others know that you can choke if you eat or drink when you develop serious symptoms of low blood sugar, such as seizures or unconsciousness. They must get medical help at once. Tell your doctor or health care professional if you have high blood sugar. You might need to change the dose of your medicine. If you are sick or exercising more than usual, you might need to change the dose of your medicine. Do not skip meals. Ask your doctor or health care professional if you should avoid alcohol. Many nonprescription cough and cold products contain sugar or alcohol. These can affect blood sugar. Make sure that you have the right  kind of syringe for the type of insulin you use. Try not to change the brand and type of insulin or syringe unless your health care professional or doctor tells you to. Switching insulin brand or type can cause dangerously high or low blood sugar. Always keep an extra supply of insulin, syringes, and needles on hand. Use a syringe one time only. Throw away syringe and needle in a closed container to prevent accidental needle sticks. Insulin pens and cartridges should never be shared. Sharing may result in passing of viruses like hepatitis or HIV. Wear a medical ID bracelet or chain, and carry a card that describes your disease and details of your medicine and dosage times. What side effects may I notice from receiving this medicine? Side effects that you should report to your health care professional or doctor as soon as possible: -allergic reactions like skin rash, itching or hives, swelling of the face, lips, or tongue -breathing problems -signs and symptoms of high blood sugar such as dizziness, dry  mouth, dry skin, fruity breath, nausea, stomach pain, increased hunger or thirst, increased urination -signs and symptoms of low blood sugar such as feeling anxious, confusion, dizziness, increased hunger, unusually weak or tired, sweating, shakiness, cold, irritable, headache, blurred vision, fast heartbeat, loss of consciousness Side effects that usually do not require medical attention (report to your health care professional or doctor if they continue or are bothersome): -increase or decrease in fatty tissue under the skin due to overuse of a particular injection site -itching, burning, swelling, or rash at site where injected This list may not describe all possible side effects. Call your doctor for medical advice about side effects. You may report side effects to FDA at 1-800-FDA-1088. Where should I keep my medicine? Keep out of the reach of children. Store unopened insulin vials in a refrigerator between 2 and 8 degrees C (36 and 46 degrees F). Do not freeze or use if the insulin has been frozen. Opened vials (vials currently in use) may be stored in the refrigerator or at room temperature, at approximately 30 degrees C (86 degrees F) or cooler. Keeping your insulin at room temperature decreases the amount of pain during injection. If you are using Humulin brand 70/30 vials, your insulin can be used until the expiration date printed on the vial. If you are using Novolin brand 70/30 vials, your insulin should be thrown away after 42 days. Store unopened pre-filled pens in a refrigerator between 2 to 8 degrees C (36 to 46 degrees F). Do not freeze or use if the insulin has been frozen. After opening, keep this medicine at room temperature, approximately 25 degrees C (77 degrees F) or cooler. Do not store in the refrigerator. Once opened, the insulin can be used for 10 days. After 10 days, the insulin pen should be thrown away. Protect from light and excessive heat. Throw away any unused medicine  after the expiration date or after the specified time for room temperature storage has passed. NOTE: This sheet is a summary. It may not cover all possible information. If you have questions about this medicine, talk to your doctor, pharmacist, or health care provider.  2014, Elsevier/Gold Standard. (2012-10-22 12:52:19)  DASH Diet The DASH diet stands for "Dietary Approaches to Stop Hypertension." It is a healthy eating plan that has been shown to reduce high blood pressure (hypertension) in as little as 14 days, while also possibly providing other significant health benefits. These other health benefits include reducing the risk  of breast cancer after menopause and reducing the risk of type 2 diabetes, heart disease, colon cancer, and stroke. Health benefits also include weight loss and slowing kidney failure in patients with chronic kidney disease.  DIET GUIDELINES  Limit salt (sodium). Your diet should contain less than 1500 mg of sodium daily.  Limit refined or processed carbohydrates. Your diet should include mostly whole grains. Desserts and added sugars should be used sparingly.  Include small amounts of heart-healthy fats. These types of fats include nuts, oils, and tub margarine. Limit saturated and trans fats. These fats have been shown to be harmful in the body. CHOOSING FOODS  The following food groups are based on a 2000 calorie diet. See your Registered Dietitian for individual calorie needs. Grains and Grain Products (6 to 8 servings daily)  Eat More Often: Whole-wheat bread, brown rice, whole-grain or wheat pasta, quinoa, popcorn without added fat or salt (air popped).  Eat Less Often: White bread, white pasta, white rice, cornbread. Vegetables (4 to 5 servings daily)  Eat More Often: Fresh, frozen, and canned vegetables. Vegetables may be raw, steamed, roasted, or grilled with a minimal amount of fat.  Eat Less Often/Avoid: Creamed or fried vegetables. Vegetables in a  cheese sauce. Fruit (4 to 5 servings daily)  Eat More Often: All fresh, canned (in natural juice), or frozen fruits. Dried fruits without added sugar. One hundred percent fruit juice ( cup [237 mL] daily).  Eat Less Often: Dried fruits with added sugar. Canned fruit in light or heavy syrup. [employer], Fish, and Poultry (2 servings or less daily. One serving is 3 to 4 oz [85-114 g]).  Eat More Often: Ninety percent or leaner ground beef, tenderloin, sirloin. Round cuts of beef, chicken breast, [place] breast. All fish. Grill, bake, or broil your meat. Nothing should be fried.  Eat Less Often/Avoid: Fatty cuts of meat, [place], or chicken leg, thigh, or wing. Fried cuts of meat or fish. Dairy (2 to 3 servings)  Eat More Often: Low-fat or fat-free milk, low-fat plain or light yogurt, reduced-fat or part-skim cheese.  Eat Less Often/Avoid: Milk (whole, 2%).Whole milk yogurt. Full-fat cheeses. Nuts, Seeds, and Legumes (4 to 5 servings per week)  Eat More Often: All without added salt.  Eat Less Often/Avoid: Salted nuts and seeds, canned beans with added salt. Fats and Sweets (limited)  Eat More Often: Vegetable oils, tub margarines without trans fats, sugar-free gelatin. Mayonnaise and salad dressings.  Eat Less Often/Avoid: Coconut oils, palm oils, butter, stick margarine, cream, half and half, cookies, candy, pie. FOR MORE INFORMATION The Dash Diet Eating Plan: www.dashdiet.org Document Released: 06/28/2011 Document Revised: 10/01/2011 Document Reviewed: 06/28/2011 [facility] Patient Information 2014 [place], [place].  Hypertension As your heart beats, it forces blood through your arteries. This force is your blood pressure. If the pressure is too high, it is called hypertension (HTN) or high blood pressure. HTN is dangerous because you may have it and not know it. High blood pressure may mean that your heart has to work harder to pump blood. Your arteries may be narrow or stiff. The extra  work puts you at risk for heart disease, stroke, and other problems.  Blood pressure consists of two numbers, a higher number over a lower, 110/72, for example. It is stated as "110 over 72." The ideal is below 120 for the top number (systolic) and under 80 for the bottom (diastolic). Write down your blood pressure today. You should pay close attention to your blood pressure if  you have certain conditions such as:  Heart failure.  Prior heart attack.  Diabetes  Chronic kidney disease.  Prior stroke.  Multiple risk factors for heart disease. To see if you have HTN, your blood pressure should be measured while you are seated with your arm held at the level of the heart. It should be measured at least twice. A one-time elevated blood pressure reading (especially in the Emergency Department) does not mean that you need treatment. There may be conditions in which the blood pressure is different between your right and left arms. It is important to see your caregiver soon for a recheck. Most people have essential hypertension which means that there is not a specific cause. This type of high blood pressure may be lowered by changing lifestyle factors such as:  Stress.  Smoking.  Lack of exercise.  Excessive weight.  Drug/tobacco/alcohol use.  Eating less salt. Most people do not have symptoms from high blood pressure until it has caused damage to the body. Effective treatment can often prevent, delay or reduce that damage. TREATMENT  When a cause has been identified, treatment for high blood pressure is directed at the cause. There are a large number of medications to treat HTN. These fall into several categories, and your caregiver will help you select the medicines that are best for you. Medications may have side effects. You should review side effects with your caregiver. If your blood pressure stays high after you have made lifestyle changes or started on medicines,   Your medication(s)  may need to be changed.  Other problems may need to be addressed.  Be certain you understand your prescriptions, and know how and when to take your medicine.  Be sure to follow up with your caregiver within the time frame advised (usually within two weeks) to have your blood pressure rechecked and to review your medications.  If you are taking more than one medicine to lower your blood pressure, make sure you know how and at what times they should be taken. Taking two medicines at the same time can result in blood pressure that is too low. SEEK IMMEDIATE MEDICAL CARE IF:  You develop a severe headache, blurred or changing vision, or confusion.  You have unusual weakness or numbness, or a faint feeling.  You have severe chest or abdominal pain, vomiting, or breathing problems. MAKE SURE YOU:   Understand these instructions.  Will watch your condition.  Will get help right away if you are not doing well or get worse. Document Released: 07/09/2005 Document Revised: 10/01/2011 Document Reviewed: 02/27/2008 [facility] Patient Information 2014 [place].  Type 2 Diabetes Mellitus, Adult Type 2 diabetes mellitus is a long-term (chronic) disease. In type 2 diabetes:  The pancreas does not make enough of a hormone called insulin.  The cells in the body do not respond as well to the insulin that is made.  Both of the above can happen. Normally, insulin moves sugars from food into tissue cells. This gives you energy. If you have type 2 diabetes, sugars cannot be moved into tissue cells. This causes high blood sugar (hyperglycemia).  HOME CARE  Have your hemoglobin A1c level checked twice a year. The level shows if your diabetes is under control or out of control.  Perform daily blood sugar testing as told by your doctor.  Check your ketone levels by testing your pee (urine) when you are sick and as told.  Take your diabetes or insulin medicine as told by your  doctor.  Never run  out of insulin.  Adjust how much insulin you give yourself based on how many carbs (carbohydrates) you eat. Carbs are in many foods, such as fruits, vegetables, whole grains, and dairy products.  Have a healthy snack between every healthy meal. Have 3 meals and 3 snacks a day.  Lose weight if you are overweight.  Carry a medical alert card or wear your medical alert jewelry.  Carry a 15 gram carb snack with you at all times. Examples include:  Glucose pills, 3 or 4.  Glucose gel, 15 gram tube.  Raisins, 2 tablespoons (24 grams).  Jelly beans, 6.  Animal crackers, 8.  Sugar pop, 4 ounces (120 milliliters).  Gummy treats, 9.  Notice low blood sugar (hypoglycemia) symptoms, such as:  Shaking (tremors).  Decreased ability to think clearly.  Sweating.  Increased heart rate.  Headache.  Dry mouth.  Hunger.  Crabbiness (irritability).  Being worried or tense (anxiety).  Restless sleep.  A change in speech or coordination.  Confusion.  Treat low blood sugar right away. If you are alert and can swallow, follow the 15:15 rule:  Take 15 20 grams of a rapid-acting glucose or carb. This includes glucose gel, glucose pills, or 4 ounces (120 milliliters) of fruit juice, regular pop, or low-fat milk.  Check your blood sugar level after taking the glucose.  Take 15 20 grams of more glucose if the repeat blood sugar level is still 70 mg/dL (milligrams/deciliter) or below.  Eat a meal or snack within 1 hour of the blood sugar levels going back to normal.  Notice early symptoms of high blood sugar, such as:  Being really thirsty or drinking a lot (polydipsia).  Peeing (urinating) a lot (polyuria).  Do at least 150 minutes of physical activity a week or as told.  Split the 150 minutes of activity up during the week. Do not do 150 minutes of activity in one day.  Perform exercises, such as weight lifting, at least 2 times a week or as told.  Adjust your insulin or  food intake as needed if you start a new exercise or sport.  Follow your sick day plan when you are not able to eat or drink as usual.  Avoid tobacco use.  Women who are not pregnant should drink no more than 1 drink a day. Men should drink no more than 2 drinks a day.  Only drink alcohol with food.  Ask your doctor if alcohol is safe for you.  Tell your doctor if you drink alcohol several times during the week.  See your doctor regularly.  Schedule an eye exam soon after you are diagnosed with diabetes. Schedule exams once every year.  Check your skin and feet every day. Check for cuts, bruises, redness, nail problems, bleeding, blisters, or sores. A doctor should do a foot exam once a year.  Brush your teeth and gums twice a day. Floss once a day. Visit your dentist regularly.  Share your diabetes plan with your workplace or school.  Stay up-to-date with shots that fight against diseases (immunizations).  Learn how to manage stress.  Get diabetes education and support as needed.  Ask your doctor for special help if:  You need help to maintain or improve how you to do things on your own.  You need help to maintain or improve the quality of your life.  You have foot or hand problems.  You have trouble cleaning yourself, dressing, eating, or doing physical  activity. GET HELP RIGHT AWAY IF:  You have trouble breathing.  You have moderate to large ketone levels.  You are unable to eat food or drink fluids for more than 6 hours.  You feel sick to your stomach (nauseous) or throw up (vomit) for more than 6 hours.  Your blood sugar level is over 240 mg/dL.  There is a change in mental status.  You get another serious illness.  You have watery poop (diarrhea) for more than 6 hours.  You have been sick or have had a fever for 2 or more days and are not getting better.  You have pain when you are physically active. MAKE SURE YOU:  Understand these  instructions.  Will watch your condition.  Will get help right away if you are not doing well or get worse. Document Released: 04/17/2008 Document Revised: 04/29/2013 Document Reviewed: 11/07/2012 [facility] Patient Information 2014 [place], [place].  Diabetes and Exercise Exercising regularly is important. It is not just about losing weight. It has many health benefits, such as:  Improving your overall fitness, flexibility, and endurance.  Increasing your bone density.  Helping with weight control.  Decreasing your body fat.  Increasing your muscle strength.  Reducing stress and tension.  Improving your overall health. People with diabetes who exercise gain additional benefits because exercise:  Reduces appetite.  Improves the body's use of blood sugar (glucose).  Helps lower or control blood glucose.  Decreases blood pressure.  Helps control blood lipids (such as cholesterol and triglycerides).  Improves the body's use of the hormone insulin by:  Increasing the body's insulin sensitivity.  Reducing the body's insulin needs.  Decreases the risk for heart disease because exercising:  Lowers cholesterol and triglycerides levels.  Increases the levels of good cholesterol (such as high-density lipoproteins [HDL]) in the body.  Lowers blood glucose levels. YOUR ACTIVITY PLAN  Choose an activity that you enjoy and set realistic goals. Your health care provider or diabetes educator can help you make an activity plan that works for you. You can break activities into 2 or 3 sessions throughout the day. Doing so is as good as one long session. Exercise ideas include:  Taking the dog for a walk.  Taking the stairs instead of the elevator.  Dancing to your favorite song.  Doing your favorite exercise with a friend. RECOMMENDATIONS FOR EXERCISING WITH TYPE 1 OR TYPE 2 DIABETES   Check your blood glucose before exercising. If blood glucose levels are greater than 240  mg/dL, check for urine ketones. Do not exercise if ketones are present.  Avoid injecting insulin into areas of the body that are going to be exercised. For example, avoid injecting insulin into:  The arms when playing tennis.  The legs when jogging.  Keep a record of:  Food intake before and after you exercise.  Expected peak times of insulin action.  Blood glucose levels before and after you exercise.  The type and amount of exercise you have done.  Review your records with your health care provider. Your health care provider will help you to develop guidelines for adjusting food intake and insulin amounts before and after exercising.  If you take insulin or oral hypoglycemic agents, watch for signs and symptoms of hypoglycemia. They include:  Dizziness.  Shaking.  Sweating.  Chills.  Confusion.  Drink plenty of water while you exercise to prevent dehydration or heat stroke. Body water is lost during exercise and must be replaced.  Talk to your health care  provider before starting an exercise program to make sure it is safe for you. Remember, almost any type of activity is better than none. Document Released: 09/29/2003 Document Revised: 03/11/2013 Document Reviewed: 12/16/2012 [facility] Patient Information 2014 [place].  Diabetic Ketoacidosis Diabetic ketoacidosis (DKA) is a life-threatening complication of type 1 diabetes. It must be quickly recognized and treated. Treatment requires hospitalization. CAUSES  When there is no insulin in the body, glucose (sugar) cannot be used and the body breaks down fat for energy. When fat breaks down, acids (ketones) build up in the blood. Very high levels of glucose and high levels of acids lead to severe loss of body fluids (dehydration) and other dangerous chemical changes. This stresses your vital organs and can cause coma or death. SYMPTOMS   Tiredness (fatigue).  Weight loss.  Excessive thirst.  Ketones in the  urine.  Lightheadedness.  Fruity or sweet smell on your breath.  Excessive urination.  Visual changes.  Confusion or irritability.  Feeling sick to your stomach (nauseous) or vomiting.  Rapid breathing.  Stomachache or belly (abdominal) pain. DIAGNOSIS  Your caregiver will diagnose DKA based on your history, physical exam, and blood tests. Your caregiver will check if there is another illness present which caused you to go into DKA. Most of this will be done quickly in an emergency room. TREATMENT   Fluid replacement to correct dehydration.  Insulin.  Correction of electrolytes, such as potassium and sodium.  Medicines (antibiotics) that kill germs for infections. PREVENTION  Always take your insulin. Do not skip your insulin injections.  If you are ill, treat yourself quickly. Your body often needs more insulin to fight the illness.  Check your blood glucose regularly.  Check urine ketones if your blood glucose is greater than 240 milligrams per deciliter (mg/dl).  Do not used expired or outdated insulin.  If your blood glucose is high, drink plenty of fluids. This helps flush out ketones. HOME CARE INSTRUCTIONS   If you are ill, follow the advice of your caregiver.  To prevent loss of body fluids (dehydration), drink enough water and fluids to keep your urine clear or pale yellow.  If you cannot eat, alternate between drinking fluids with sugar (soda, juices, flavored gelatin) and salty fluids (broth, bouillon).  If you can eat, follow your usual diet and drink sugar-free liquids (water, diet drinks).  Always take your usual dose of insulin. If you cannot eat, or your glucose is getting too low, call your caregiver for further instructions.  Continue to monitor your blood or urine ketones every 3 to 4 hours around the clock. Set your alarm clock or have someone wake you up. If you are too sick, have someone test it for you.  Rest and avoid exercise. SEEK  MEDICAL CARE IF:   You have ketones in your urine or your blood glucose is higher than a level your caregiver suggests. You may need extra insulin. Call your caregiver if you need advice on adjusting your insulin.  You cannot drink at least a tablespoon of fluid every 15 to 20 minutes.  You have been throwing up for more than 2 hours.  You have symptoms of DKA:  Fruity smelling breath.  Breathing faster or slower.  Becoming very sleepy. SEEK IMMEDIATE MEDICAL CARE IF:   You have signs of dehydration:  Decreased urination.  Increased thirst.  Dry skin and mouth.  Lightheadedness.  Your blood glucose is very high (as advised by your caregiver) twice in a row.  You or your child has an oral temperature above 102 F (38.9 C), not controlled by medicine.  You pass out.  You have chest pain and/or trouble breathing.  You have a sudden, severe headache.  You have sudden weakness in one arm and/or one leg.  You have sudden difficulty speaking and/or swallowing.  You develop vomiting and/or diarrhea that is getting worse after 3 to 4 hours.  You have abdominal pain. MAKE SURE YOU:   Understand these instructions.  Will watch your condition.  Will get help right away if you are not doing well or get worse. Document Released: 07/06/2000 Document Revised: 10/01/2011 Document Reviewed: 01/12/2009 [facility] Patient Information 2014 [place], [place].

## 2013-11-27 NOTE — Progress Notes (Signed)
Subjective: Denies n/v/ab pain.  She is tolerating oral intake.  She is feeling better today.  She wants to follow up with community health and wellness.   Objective: Vital signs in last 24 hours: Filed Vitals:   11/26/13 2201 11/27/13 0250 11/27/13 0615 11/27/13 0900  BP: 127/77 106/52 110/48 134/70  Pulse: 78 66 64 85  Temp: 97.8 F (36.6 C) 98 F (36.7 C) 98.2 F (36.8 C) 98 F (36.7 C)  TempSrc: Oral Oral Oral Oral  Resp: 18 16 17 18   Height:      Weight:      SpO2: 98% 96% 98% 98%   Weight change:   Intake/Output Summary (Last 24 hours) at 11/27/13 1125 Last data filed at 11/27/13 0900  Gross per 24 hour  Intake 5209.58 ml  Output   2000 ml  Net 3209.58 ml   Vitals reviewed. General: resting in bed, NAD HEENT: [place]/at, no scleral icterus Cardiac: RRR, no rubs, murmurs or gallops Pulm: clear to auscultation bilaterally, no wheezes, rales, or rhonchi Abd: soft, nontender, nondistended, BS present Ext: warm and well perfused, no edema  Neuro: alert and oriented X3, cranial nerves II-XII grossly intact Lab Results: Basic Metabolic Panel:  Recent Labs Lab 11/26/13 1115 11/27/13 0600  NA 139 141  K 4.1 4.1  CL 106 107  CO2 19 22  GLUCOSE 159* 216*  BUN 10 8  CREATININE 0.61 0.50  CALCIUM 8.4 8.2*   Liver Function Tests:  Recent Labs Lab 11/25/13 1418  AST 22  ALT 22  ALKPHOS 136*  BILITOT 0.3  PROT 7.5  ALBUMIN 3.6   CBC:  Recent Labs Lab 11/25/13 1418 11/26/13 0341  WBC 7.8 7.4  HGB 12.9 10.6*  HCT 39.8 33.4*  MCV 96.4 95.7  PLT 522* 427*   Cardiac Enzymes:  Recent Labs Lab 11/25/13 1940 11/26/13 0110 11/26/13 0734  TROPONINI <0.30 <0.30 <0.30   CBG:  Recent Labs Lab 11/26/13 1115 11/26/13 1210 11/26/13 1719 11/26/13 2155 11/26/13 2231 11/27/13 0809  GLUCAP 142* 87 212* 64* 79 252*   Hemoglobin A1C:  Recent Labs Lab 11/25/13 1940  HGBA1C 10.4*   Urine Drug Screen: Drugs of Abuse     Component Value Date/Time    LABOPIA NONE DETECTED 04/14/2012 0535   COCAINSCRNUR NONE DETECTED 04/14/2012 0535   LABBENZ POSITIVE* 04/14/2012 0535   AMPHETMU NONE DETECTED 04/14/2012 0535   THCU NONE DETECTED 04/14/2012 0535   LABBARB NONE DETECTED 04/14/2012 0535    Urinalysis:  Recent Labs Lab 11/25/13 1549  COLORURINE YELLOW  LABSPEC 1.029  PHURINE 5.0  GLUCOSEU >1000*  HGBUR NEGATIVE  BILIRUBINUR NEGATIVE  KETONESUR >80*  PROTEINUR NEGATIVE  UROBILINOGEN 0.2  NITRITE NEGATIVE  LEUKOCYTESUR NEGATIVE   Misc. Labs: None   Micro Results: Recent Results (from the past 240 hour(s))  MRSA PCR SCREENING     Status: None   Collection Time    11/25/13  6:34 PM      Result Value Ref Range Status   MRSA by PCR NEGATIVE  NEGATIVE Final   Comment:            The GeneXpert MRSA Assay (FDA     approved for NASAL specimens     only), is one component of a     comprehensive MRSA colonization     surveillance program. It is not     intended to diagnose MRSA     infection nor to guide or     monitor treatment for  MRSA infections.   Studies/Results: No results found. Medications:  Scheduled Meds: . aspirin EC  81 mg Oral Daily  . atorvastatin  40 mg Oral QHS  . enoxaparin (LOVENOX) injection  40 mg Subcutaneous Q24H  . losartan  100 mg Oral Daily   And  . hydrochlorothiazide  12.5 mg Oral Daily  . insulin aspart  0-15 Units Subcutaneous TID WC  . insulin aspart  0-5 Units Subcutaneous QHS  . insulin aspart protamine- aspart  30 Units Subcutaneous BID WC  . pantoprazole  40 mg Oral Daily   Continuous Infusions:  PRN Meds:.dextrose, diazepam, gi cocktail, HYDROcodone-acetaminophen, traZODone Assessment/Plan: 53 y.o PMH DM 2 uncontrolled, depression, constipation, chronic pain (back), fibromyalgia, anxiety, anemia.  She presented with DKA which is resolved.   #DM (diabetes mellitus) type 2, uncontrolled, with ketoacidosis -DKA resolved. HA1C 10.4  -on Aspirin 81 mg daily, Lipitor 40 mg -carb mod  diet  -SSI M, qhs, 70/30 30 units bid  -f/u with [facility] in 1 week   #HTN (hypertension) -controlled  -continued home medication home medications (HCTZ 12.5 mg qd, Cozaar 100)   #Chronic medical conditions  -stable continue home medications   #epigastric pain -likely GERD will continue PPI affordable PPI  #DVT px  -Lovenox   Dispo: D/c today   The patient does have a current PCP will establish with [facility] and does need an [facility] hospital follow-up appointment after discharge.  The patient does not have transportation limitations that hinder transportation to clinic appointments.  .Services Needed at time of discharge: Y = Yes, Blank = No PT:   OT:   RN:   Equipment:   Other:     LOS: 2 days   Cresenciano Genre, MD [phone] 11/27/2013, 11:25 AM

## 2013-11-27 NOTE — Discharge Planning (Addendum)
Copy of AVS to pt who verbalizes understanding.  Will dc to private car with all personal belongings, acomp. By daughter when DC order is complete.  dc'd at 1455

## 2013-11-27 NOTE — Progress Notes (Signed)
Inpatient Diabetes Program Recommendations  AACE/ADA: New Consensus Statement on Inpatient Glycemic Control (2013)  Target Ranges:  Prepandial:   less than 140 mg/dL      Peak postprandial:   less than 180 mg/dL (1-2 hours)      Critically ill patients:  140 - 180 mg/dL   Results for [name], [name] (MRN [id number]) as of 11/27/2013 11:39  Ref. Range 11/26/2013 09:06 11/26/2013 10:01 11/26/2013 11:15 11/26/2013 12:10 11/26/2013 17:19 11/26/2013 21:55 11/26/2013 22:31 11/27/2013 08:09  Glucose-Capillary Latest Range: 70-99 mg/dL 164 (H) 136 (H) 142 (H) 87 212 (H) 64 (L) 79 252 (H)   Diabetes history: DM2  Outpatient Diabetes medications: Lantus 60 units QHS (pt reports sometimes split into 2 doses and uses 30 units BID), Humalog 75/25 5-10 units TID with meals (5 units if CBG <200 or 10 units if CBG >200)  Current orders for Inpatient glycemic control: 70/30 30 units BID, Novolog 0-15 units AC, Novolog 0-5 units HS  Inpatient Diabetes Program Recommendations Correction (SSI): Please consider decreasing Novolog correction to sensitive scale.  Note: CBG at 17:19 on 5/7 was 212 mg/dl and patient received 70/30 30 units along with Novolog 5 units for correction (per moderate scale). Following CBG noted to be 64 mg/dl at 21:55 on 5/7. Fasting up to 252 mg/dl this morning, question if low blood glucose was over treated last night.  Recommend decreasing Novolog correction to sensitive scale.  Thanks, Barnie Alderman, RN, MSN, CCRN Diabetes Coordinator Inpatient Diabetes Program [phone] (Team Pager) [phone] ([facility] office) [phone] [facility] office)

## 2013-11-27 NOTE — Discharge Summary (Signed)
Name: [name] MRN: [id number] DOB: 09/16/1960 53 y.o. PCP: Provider Default, MD  Date of Admission: 11/25/2013  2:38 PM Date of Discharge: 11/27/2013 Attending Physician: Bartholomew Crews, MD  Discharge Diagnosis: 1. DM (diabetes mellitus) type 2, uncontrolled, with ketoacidosis (DKA now resolved) 2. HTN (hypertension) 3. Chest pain 4. Chronic medical conditions (pain, depression, anxiety, constipation, fibromyalgia) 5. Normocytic anemia   Discharge Medications:   Medication List    STOP taking these medications       insulin glargine 100 UNIT/ML injection  Commonly known as:  LANTUS     insulin lispro protamine-lispro (75-25) 100 UNIT/ML Susp injection  Commonly known as:  HUMALOG 75/25 MIX      TAKE these medications       aspirin EC 81 MG tablet  Take 81 mg by mouth daily.     atorvastatin 40 MG tablet  Commonly known as:  LIPITOR  Take 40 mg by mouth at bedtime.     diazepam 10 MG tablet  Commonly known as:  VALIUM  Take 10 mg by mouth every 12 (twelve) hours as needed for anxiety.     docusate sodium 100 MG capsule  Commonly known as:  COLACE  Take 1 capsule (100 mg total) by mouth 3 (three) times daily as needed for constipation.     fexofenadine 180 MG tablet  Commonly known as:  ALLEGRA  Take 180 mg by mouth daily.     fluticasone 50 MCG/ACT nasal spray  Commonly known as:  FLONASE  Place 1 spray into both nostrils daily.     gabapentin 300 MG capsule  Commonly known as:  NEURONTIN  Take 1 capsule (300 mg total) by mouth 3 (three) times daily.     HYDROcodone-acetaminophen 5-325 MG per tablet  Commonly known as:  NORCO/VICODIN  Take 1 tablet by mouth every 6 (six) hours as needed for moderate pain.     insulin aspart protamine- aspart (70-30) 100 UNIT/ML injection  Commonly known as:  NOVOLOG MIX 70/30  Inject 0.3 mLs (30 Units total) into the skin 2 (two) times daily with a meal.     losartan-hydrochlorothiazide 100-12.5 MG per  tablet  Commonly known as:  HYZAAR  Take 1 tablet by mouth daily.     meloxicam 15 MG tablet  Commonly known as:  MOBIC  Take 1 tablet (15 mg total) by mouth daily.     metoCLOPramide 10 MG tablet  Commonly known as:  REGLAN  Take 1 tablet (10 mg total) by mouth every 8 (eight) hours as needed (headache with nausea).     omeprazole 40 MG capsule  Commonly known as:  PRILOSEC  Take 1 capsule (40 mg total) by mouth daily.     potassium chloride 10 MEQ tablet  Commonly known as:  K-DUR,KLOR-CON  Take 10 mEq by mouth daily.     traZODone 100 MG tablet  Commonly known as:  DESYREL  Take 100 mg by mouth at bedtime as needed for sleep.        Disposition and follow-up:   [name] was discharged from [facility] in stable condition.  At the hospital follow up visit please address:  1.   -Repeat BMET, CBC, anemia panel for normocytic anemia  -Address medication compliance and affordability of medications -Is patient candidate for orange card   2.  Labs / imaging needed at time of follow-up: see above  3.  Pending labs/ test needing follow-up: none  Follow-up Appointments: Follow-up Information   Follow up with [facility]    . (Appointment Dec 15, 2013 @ 4:00 p.m.-financial counselor )    Contact information:   [address] [place] [address] [phone]      Follow up with [facility]    . (follow up 12/08/13 at 5PM. They do not have a sooner appt for financial counseling. Please take necessary documents )    Contact information:   [address] [place] [address] [phone]      Discharge Instructions:  Future Appointments Provider Department Dept Phone   12/15/2013 4:00 PM [facility] Financial Counselor [facility] [phone]      Consultations:  DM education   Admission HPI:  Chief Complaint:  Chief Complaint     Patient presents with   .  Hyperglycemia    History of Present Illness: [name] is a 53 y.o. female with a past medical history of DMII, DKA (04/14/2012), HTN, fibromyalgia, depression/anxiety who p/w HA, N/V, abdominal pain. She reports being out of her insulin for the past week d/t insurance issues and losing her job and was unable to get her insulin. She began having a HA, polyuria, polydipsia, and associated nausea and CP on Friday. She checked blood sugar and it was over 400. Her daughter also has DM and she took some of her insulin which brought her bs down some. She was visiting a friend today in the hospital when she began feeling more nauseated and began vomiting. She reports non-exertional substernal CP that started on Sunday, was sharp, comes and goes, lasts for a few minutes, and does not radiate or change with position. She reports relief with ASA. She denies any FH of MI. Today, she reports the CP is more like a burning sensation that feels more like acid reflux. She is also belching a lot. She denies any dysuria or diarrhea/constipation.  She reports having a previous episode of DKA a couple of years ago and states this feels like her prior episode.  ED course: In the ED, pt serum glucose was 696 with bicarb of 12. She was started on insulin gtt with IVF.  Review of Systems:  Pertinent items are noted in HPI.  Physical Exam:  Filed Vitals:    11/25/13 1730   BP:  131/51   Pulse:  108   Temp:    Resp:     Physical Exam  Constitutional: Vital signs reviewed. Patient is a well-developed and well-nourished female who is lying in bed appears uncomfortable with a mask over her eyes due to severe HA.  Head: Normocephalic and atraumatic  Eyes: PERRL, EOMI, conjunctivae normal, no scleral icterus.  Neck: Supple, Trachea midline .  Cardiovascular: RRR, no MRG, pulses symmetric and intact bilaterally  Pulmonary/Chest: normal respiratory effort, CTAB, no wheezes, rales, or  rhonchi  Abdominal: Soft. Mild tenderness in epigastric area, bowel sounds are normal, no masses, organomegaly, or guarding present.  Musculoskeletal: No joint deformities, erythema, or stiffness Neurological: A&O x3, cranial nerve II-XII are grossly intact, no focal motor deficit'  Skin: Warm, dry and intact. No rash, cyanosis, or clubbing.  Extremities: No peripheral edema Psychiatric: Normal mood and affect.   Hospital Course by problem list: 1. DM (diabetes mellitus) type 2, uncontrolled, with ketoacidosis (DKA now resolved) 2. HTN (hypertension) 3. Chest pain 4. Chronic medical conditions (pain, depression, anxiety, constipation, fibromyalgia) 5. Normocytic anemia  53 y.o PMH DM 2 uncontrolled, depression, constipation, chronic pain (back), fibromyalgia, anxiety, anemia. She presented with DKA which is resolved.   1. DM (diabetes mellitus) type 2, uncontrolled, with ketoacidosis (DKA now resolved) DKA resolved associated with nausea, vomiting, headache on admission now resolved. She was on an Insulin drip this admission and transitioned to affordable 70/30 30 units bid.  Her HA1C 10.4 %. She had been noncompliant due to inability to afford medications which is why she had DKA. She will continue Aspirin 81 mg daily, Lipitor 40 mg.  Earliest appointment for [facility] is 12/08/13.  Titrate insulin accordingly.    2. HTN (hypertension)  Controlled.  Continued home medication home medications (HCTZ 12.5 mg qd, Cozaar 100)  3. Chest pain Likely noncardiac (i.e related to GERD associated with epigastric pain) heart enzymes negative x 3 this admission.  Symptoms relieved with Protonix will transition to Prilosec outpatient.   4. Chronic medical conditions (pain, depression, anxiety, constipation, fibromyalgia) Stable continue home medications.  She need a refill of Gabapentin, Mobic prior to discharge which were sent to [facility] in [place].   5. Normocytic anemia    Further work up outpatient with anemia panel   6. DVT prophylaxis   Lovenox given this admission.       Discharge Vitals:   BP 134/70  Pulse 85  Temp(Src) 98 F (36.7 C) (Oral)  Resp 18  Ht 5\' 5"  (1.651 m)  Wt 189 lb 2.5 oz (85.8 kg)  BMI 31.48 kg/m2  SpO2 98%  Discharge physical exam:  General: resting in bed, NAD  HEENT: [place]/at, no scleral icterus  Cardiac: RRR, no rubs, murmurs or gallops  Pulm: clear to auscultation bilaterally, no wheezes, rales, or rhonchi  Abd: soft, nontender, nondistended, BS present  Ext: warm and well perfused, no edema  Neuro: alert and oriented X3, cranial nerves II-XII grossly intact  Discharge Labs:  Results for VIONA, HOSKING (MRN [id number]) as of 11/27/2013 14:26  Ref. Range 11/25/2013 16:11  Sample type No range found VENOUS  pH, Ven Latest Range: 7.250-7.300  7.195 (LL)  pCO2, Ven Latest Range: 45.0-50.0 mmHg 33.6 (L)  pO2, Ven Latest Range: 30.0-45.0 mmHg 36.0  Bicarbonate Latest Range: 20.0-24.0 mEq/L 13.0 (L)  TCO2 Latest Range: 0-100 mmol/L 14  Acid-base deficit Latest Range: 0.0-2.0 mmol/L 14.0 (H)  O2 Saturation No range found 58.0   Results for CHAYSE, GRACEY (MRN [id number]) as of 11/27/2013 14:26  Ref. Range 11/26/2013 01:10 11/26/2013 07:34 11/26/2013 09:25 11/26/2013 11:15 11/27/2013 06:00  Sodium Latest Range: 137-147 mEq/L 136 (L) 138  139 141  Potassium Latest Range: 3.7-5.3 mEq/L 5.0 3.9  4.1 4.1  Chloride Latest Range: 96-112 mEq/L 105 104  106 107  CO2 Latest Range: 19-32 mEq/L 18 (L) 21  19 22   BUN Latest Range: 6-23 mg/dL 17 12  10 8   Creatinine Latest Range: 0.50-1.10 mg/dL 0.66 0.65  0.61 0.50  Calcium Latest Range: 8.4-10.5 mg/dL 8.6 8.5  8.4 8.2 (L)  GFR calc non Af Amer Latest Range: >90 mL/min >90 >90  >90 >90  GFR calc Af Amer Latest Range: >90 mL/min >90 >90  >90 >90  Glucose Latest Range: 70-99 mg/dL 208 (H) 158 (H)  159 (H) 216 (H)  Troponin I Latest Range: <0.30 ng/mL <0.30 <0.30     Lactic Acid, Venous  Latest Range: 0.5-2.2 mmol/L   1.0     Results for YAMEL, BALE (MRN [id number]) as of 11/27/2013 14:26  Ref.  Range 11/25/2013 21:15 11/25/2013 21:16 11/25/2013 22:36  Sodium Latest Range: 137-147 mEq/L  138 135 (L)  Potassium Latest Range: 3.7-5.3 mEq/L  5.2 5.3  Chloride Latest Range: 96-112 mEq/L  105 105  CO2 Latest Range: 19-32 mEq/L  12 (L) 17 (L)  BUN Latest Range: 6-23 mg/dL  21 21  Creatinine Latest Range: 0.50-1.10 mg/dL  0.72 0.78  Calcium Latest Range: 8.4-10.5 mg/dL  8.4 8.3 (L)  GFR calc non Af Amer Latest Range: >90 mL/min  >90 >90  GFR calc Af Amer Latest Range: >90 mL/min  >90 >90  Glucose Latest Range: 70-99 mg/dL  167 (H) 176 (H)  Results for SHAMEIKA, GERMANI (MRN [id number]) as of 11/27/2013 14:26  Ref. Range 11/25/2013 14:18 11/25/2013 14:47 11/25/2013 19:40 11/25/2013 21:15  Sodium Latest Range: 137-147 mEq/L 135 (L)  143   Potassium Latest Range: 3.7-5.3 mEq/L 5.9 (H)  5.3   Chloride Latest Range: 96-112 mEq/L 87 (L)  106   CO2 Latest Range: 19-32 mEq/L 12 (L)  13 (L)   Mean Plasma Glucose Latest Range: <117 mg/dL   252 (H)   BUN Latest Range: 6-23 mg/dL 28 (H)  23   Creatinine Latest Range: 0.50-1.10 mg/dL 0.87  0.82   Calcium Latest Range: 8.4-10.5 mg/dL 9.9  8.5   GFR calc non Af Amer Latest Range: >90 mL/min 75 (L)  80 (L)   GFR calc Af Amer Latest Range: >90 mL/min 87 (L)  >90   Glucose Latest Range: 70-99 mg/dL 696 (HH)  282 (H)   Alkaline Phosphatase Latest Range: 39-117 U/L 136 (H)     Albumin Latest Range: 3.5-5.2 g/dL 3.6     AST Latest Range: 0-37 U/L 22     ALT Latest Range: 0-35 U/L 22     Total Protein Latest Range: 6.0-8.3 g/dL 7.5     Total Bilirubin Latest Range: 0.3-1.2 mg/dL 0.3     Troponin I Latest Range: <0.30 ng/mL   <0.30   Lactic Acid, Venous Latest Range: 0.5-2.2 mmol/L  2.37 (H)  2.7 (H)   Results for MADDYX, BEAMES (MRN [id number]) as of 11/27/2013 14:26  Ref. Range 11/25/2013 14:18 11/26/2013 03:41  WBC Latest Range: 4.0-10.5 K/uL 7.8  7.4  RBC Latest Range: 3.87-5.11 MIL/uL 4.13 3.49 (L)  Hemoglobin Latest Range: 12.0-15.0 g/dL 12.9 10.6 (L)  HCT Latest Range: 36.0-46.0 % 39.8 33.4 (L)  MCV Latest Range: 78.0-100.0 fL 96.4 95.7  MCH Latest Range: 26.0-34.0 pg 31.2 30.4  MCHC Latest Range: 30.0-36.0 g/dL 32.4 31.7  RDW Latest Range: 11.5-15.5 % 14.7 14.7  Platelets Latest Range: 150-400 K/uL 522 (H) 427 (H)   Results for IRELYNNE, WIITA (MRN [id number]) as of 11/27/2013 14:26  Ref. Range 11/25/2013 19:40  Hemoglobin A1C Latest Range: <5.7 % 10.4 (H)  Results for THOMASENE, KURZAWA (MRN [id number]) as of 11/27/2013 14:26  Ref. Range 11/25/2013 15:49  Color, Urine Latest Range: YELLOW  YELLOW  APPearance Latest Range: CLEAR  CLEAR  Specific Gravity, Urine Latest Range: 1.005-1.030  1.029  pH Latest Range: 5.0-8.0  5.0  Glucose Latest Range: NEGATIVE mg/dL >1000 (A)  Bilirubin Urine Latest Range: NEGATIVE  NEGATIVE  Ketones, ur Latest Range: NEGATIVE mg/dL >80 (A)  Protein Latest Range: NEGATIVE mg/dL NEGATIVE  Urobilinogen, UA Latest Range: 0.0-1.0 mg/dL 0.2  Nitrite Latest Range: NEGATIVE  NEGATIVE  Leukocytes, UA Latest Range: NEGATIVE  NEGATIVE  Hgb urine dipstick Latest Range: NEGATIVE  NEGATIVE  Squamous Epithelial / LPF Latest Range: RARE  RARE  Bacteria, UA Latest Range: RARE  RARE  Results for GENTRI, GUARDADO (MRN [id number]) as of 11/27/2013 14:26  Ref. Range 11/25/2013 15:49  Color, Urine Latest Range: YELLOW  YELLOW  APPearance Latest Range: CLEAR  CLEAR  Specific Gravity, Urine Latest Range: 1.005-1.030  1.029  pH Latest Range: 5.0-8.0  5.0  Glucose Latest Range: NEGATIVE mg/dL >1000 (A)  Bilirubin Urine Latest Range: NEGATIVE  NEGATIVE  Ketones, ur Latest Range: NEGATIVE mg/dL >80 (A)  Protein Latest Range: NEGATIVE mg/dL NEGATIVE  Urobilinogen, UA Latest Range: 0.0-1.0 mg/dL 0.2  Nitrite Latest Range: NEGATIVE  NEGATIVE  Leukocytes, UA Latest Range: NEGATIVE  NEGATIVE  Hgb urine dipstick Latest  Range: NEGATIVE  NEGATIVE  Squamous Epithelial / LPF Latest Range: RARE  RARE  Bacteria, UA Latest Range: RARE  RARE    Results for orders placed during the hospital encounter of 11/25/13 (from the past 24 hour(s))  GLUCOSE, CAPILLARY     Status: Abnormal   Collection Time    11/26/13  5:19 PM      Result Value Ref Range   Glucose-Capillary 212 (*) 70 - 99 mg/dL  GLUCOSE, CAPILLARY     Status: Abnormal   Collection Time    11/26/13  9:55 PM      Result Value Ref Range   Glucose-Capillary 64 (*) 70 - 99 mg/dL   Comment 1 Notify RN    GLUCOSE, CAPILLARY     Status: None   Collection Time    11/26/13 10:31 PM      Result Value Ref Range   Glucose-Capillary 79  70 - 99 mg/dL  BASIC METABOLIC PANEL     Status: Abnormal   Collection Time    11/27/13  6:00 AM      Result Value Ref Range   Sodium 141  137 - 147 mEq/L   Potassium 4.1  3.7 - 5.3 mEq/L   Chloride 107  96 - 112 mEq/L   CO2 22  19 - 32 mEq/L   Glucose, Bld 216 (*) 70 - 99 mg/dL   BUN 8  6 - 23 mg/dL   Creatinine, Ser 0.50  0.50 - 1.10 mg/dL   Calcium 8.2 (*) 8.4 - 10.5 mg/dL   GFR calc non Af Amer >90  >90 mL/min   GFR calc Af Amer >90  >90 mL/min  GLUCOSE, CAPILLARY     Status: Abnormal   Collection Time    11/27/13  8:09 AM      Result Value Ref Range   Glucose-Capillary 252 (*) 70 - 99 mg/dL  GLUCOSE, CAPILLARY     Status: Abnormal   Collection Time    11/27/13 11:51 AM      Result Value Ref Range   Glucose-Capillary 184 (*) 70 - 99 mg/dL    Signed: Cresenciano Genre, MD 11/27/2013, 2:26 PM   Time Spent on Discharge: >30 minutes Services Ordered on Discharge: none Equipment Ordered on Discharge: none

## 2013-12-08 ENCOUNTER — Encounter: Payer: Self-pay | Admitting: Internal Medicine

## 2013-12-08 ENCOUNTER — Ambulatory Visit: Payer: Medicaid Other | Attending: Internal Medicine | Admitting: Internal Medicine

## 2013-12-08 VITALS — BP 157/95 | HR 73 | Temp 98.2°F | Resp 14 | Ht 65.5 in | Wt 203.2 lb

## 2013-12-08 DIAGNOSIS — M545 Low back pain, unspecified: Secondary | ICD-10-CM | POA: Insufficient documentation

## 2013-12-08 DIAGNOSIS — E1310 Other specified diabetes mellitus with ketoacidosis without coma: Secondary | ICD-10-CM

## 2013-12-08 DIAGNOSIS — G8929 Other chronic pain: Secondary | ICD-10-CM | POA: Insufficient documentation

## 2013-12-08 DIAGNOSIS — Z7982 Long term (current) use of aspirin: Secondary | ICD-10-CM | POA: Insufficient documentation

## 2013-12-08 DIAGNOSIS — Z794 Long term (current) use of insulin: Secondary | ICD-10-CM | POA: Insufficient documentation

## 2013-12-08 DIAGNOSIS — Z87891 Personal history of nicotine dependence: Secondary | ICD-10-CM | POA: Insufficient documentation

## 2013-12-08 DIAGNOSIS — Z139 Encounter for screening, unspecified: Secondary | ICD-10-CM

## 2013-12-08 DIAGNOSIS — R0981 Nasal congestion: Secondary | ICD-10-CM | POA: Insufficient documentation

## 2013-12-08 DIAGNOSIS — I10 Essential (primary) hypertension: Secondary | ICD-10-CM | POA: Insufficient documentation

## 2013-12-08 DIAGNOSIS — E1110 Type 2 diabetes mellitus with ketoacidosis without coma: Secondary | ICD-10-CM

## 2013-12-08 DIAGNOSIS — Z79899 Other long term (current) drug therapy: Secondary | ICD-10-CM | POA: Insufficient documentation

## 2013-12-08 DIAGNOSIS — F411 Generalized anxiety disorder: Secondary | ICD-10-CM | POA: Insufficient documentation

## 2013-12-08 DIAGNOSIS — J3489 Other specified disorders of nose and nasal sinuses: Secondary | ICD-10-CM | POA: Insufficient documentation

## 2013-12-08 DIAGNOSIS — Z130 Encounter for screening for diseases of the blood and blood-forming organs and certain disorders involving the immune mechanism: Secondary | ICD-10-CM | POA: Insufficient documentation

## 2013-12-08 LAB — CBC WITH DIFFERENTIAL/PLATELET
Basophils Absolute: 0 10*3/uL (ref 0.0–0.1)
Basophils Relative: 0 % (ref 0–1)
EOS ABS: 0.1 10*3/uL (ref 0.0–0.7)
EOS PCT: 1 % (ref 0–5)
HCT: 34.8 % — ABNORMAL LOW (ref 36.0–46.0)
HEMOGLOBIN: 11.5 g/dL — AB (ref 12.0–15.0)
LYMPHS PCT: 35 % (ref 12–46)
Lymphs Abs: 2.9 10*3/uL (ref 0.7–4.0)
MCH: 30.5 pg (ref 26.0–34.0)
MCHC: 33 g/dL (ref 30.0–36.0)
MCV: 92.3 fL (ref 78.0–100.0)
MONOS PCT: 9 % (ref 3–12)
Monocytes Absolute: 0.8 10*3/uL (ref 0.1–1.0)
Neutro Abs: 4.6 10*3/uL (ref 1.7–7.7)
Neutrophils Relative %: 55 % (ref 43–77)
Platelets: 539 10*3/uL — ABNORMAL HIGH (ref 150–400)
RBC: 3.77 MIL/uL — AB (ref 3.87–5.11)
RDW: 14.5 % (ref 11.5–15.5)
WBC: 8.4 10*3/uL (ref 4.0–10.5)

## 2013-12-08 LAB — COMPLETE METABOLIC PANEL WITH GFR
ALK PHOS: 101 U/L (ref 39–117)
ALT: 15 U/L (ref 0–35)
AST: 17 U/L (ref 0–37)
Albumin: 3.6 g/dL (ref 3.5–5.2)
BUN: 14 mg/dL (ref 6–23)
CO2: 28 mEq/L (ref 19–32)
Calcium: 8.8 mg/dL (ref 8.4–10.5)
Chloride: 105 mEq/L (ref 96–112)
Creat: 0.71 mg/dL (ref 0.50–1.10)
GFR, Est African American: >89 mL/min
GFR, Est Non African American: >89 mL/min
Glucose, Bld: 72 mg/dL (ref 70–99)
POTASSIUM: 3.8 meq/L (ref 3.5–5.3)
Sodium: 143 mEq/L (ref 135–145)
Total Bilirubin: 0.2 mg/dL (ref 0.2–1.2)
Total Protein: 6.1 g/dL (ref 6.0–8.3)

## 2013-12-08 LAB — LIPID PANEL
CHOLESTEROL: 244 mg/dL — AB (ref 0–200)
HDL: 72 mg/dL (ref >39)
LDL Cholesterol: 137 mg/dL — ABNORMAL HIGH (ref 0–99)
Total CHOL/HDL Ratio: 3.4 Ratio
Triglycerides: 173 mg/dL — ABNORMAL HIGH (ref <150)
VLDL: 35 mg/dL (ref 0–40)

## 2013-12-08 LAB — GLUCOSE, POCT (MANUAL RESULT ENTRY)
POC Glucose: 45 mg/dl — AB (ref 70–99)
POC Glucose: 60 mg/dl — AB (ref 70–99)

## 2013-12-08 MED ORDER — INSULIN ASPART PROT & ASPART (70-30 MIX) 100 UNIT/ML ~~LOC~~ SUSP: 30.0000 [IU] | Freq: Two times a day (BID) | SUBCUTANEOUS | Status: DC

## 2013-12-08 MED ORDER — FLUTICASONE PROPIONATE 50 MCG/ACT NA SUSP: 1.0000 | Freq: Every day | NASAL | Status: DC

## 2013-12-08 NOTE — Progress Notes (Signed)
HFU for diabetes States she needs med refills States 2 week history of epigastric pain. Sates she feels like she is retaining fluid.

## 2013-12-08 NOTE — Progress Notes (Signed)
Patient Demographics  [name], is a 53 y.o. female  TIW:[id number]  SNK:[id number]  DOB - Sep 15, 1960  CC:  Chief Complaint  Patient presents with  . Hospitalization Follow-up  . Diabetes       HPI: [name] is a 53 y.o. female here today to establish medical care. Patient has history of diabetes hypertension depression anxiety, she was recently hospitalized with nausea vomiting abdominal pain, EMR reviewed, she was diagnosed and treated for DKA she was treated with IV insulin and was discharged with 70/30 to take 30 units twice a day her last hemoglobin A1c was 10.4%, for hypertension she was continued with hydrochlorothiazide and Cozaar, she also had some chest pain ACS was ruled out, today her blood sugars is low and was given crackers, patient blood sugar improved and she also symptomatically improved, she is trying to modify her diet, have advised patient to decrease the dose of insulin if she has more frequent hypoglycemic episodes. Patient also complained of lot of nasal congestion and is requesting refill on Flonase. Patient also history of chronic back pain and following up with the specialist. Her Patient has No headache, No chest pain, No abdominal pain - No Nausea, No new weakness tingling or numbness, No Cough - SOB.  No Known Allergies Past Medical History  Diagnosis Date  . Diabetes mellitus     h/o dka  . Depression   . Constipation   . Fibromyalgia   . Anemia   . Anxiety    Current Outpatient Prescriptions on File Prior to Visit  Medication Sig Dispense Refill  . aspirin EC 81 MG tablet Take 81 mg by mouth daily.       Marland Kitchen atorvastatin (LIPITOR) 40 MG tablet Take 40 mg by mouth at bedtime.       . diazepam (VALIUM) 10 MG tablet Take 10 mg by mouth every 12 (twelve) hours as needed for anxiety.      . fexofenadine (ALLEGRA) 180 MG tablet Take 180 mg by mouth daily.       Marland Kitchen gabapentin (NEURONTIN) 300 MG capsule Take 1 capsule (300 mg total)  by mouth 3 (three) times daily.  90 capsule  0  . HYDROcodone-acetaminophen (NORCO/VICODIN) 5-325 MG per tablet Take 1 tablet by mouth every 6 (six) hours as needed for moderate pain.  10 tablet  0  . losartan-hydrochlorothiazide (HYZAAR) 100-12.5 MG per tablet Take 1 tablet by mouth daily.      . meloxicam (MOBIC) 15 MG tablet Take 1 tablet (15 mg total) by mouth daily.  30 tablet  0  . traZODone (DESYREL) 100 MG tablet Take 100 mg by mouth at bedtime as needed for sleep.       Marland Kitchen docusate sodium (COLACE) 100 MG capsule Take 1 capsule (100 mg total) by mouth 3 (three) times daily as needed for constipation.  30 capsule  0  . metoCLOPramide (REGLAN) 10 MG tablet Take 1 tablet (10 mg total) by mouth every 8 (eight) hours as needed (headache with nausea).  20 tablet  0  . omeprazole (PRILOSEC) 40 MG capsule Take 1 capsule (40 mg total) by mouth daily.  30 capsule  0  . potassium chloride (K-DUR,KLOR-CON) 10 MEQ tablet Take 10 mEq by mouth daily.       No current facility-administered medications on file prior to visit.   Family History  Problem Relation Age of Onset  . Hypertension Mother   . Cancer Mother   . Asthma Sister   .  Asthma Brother   . Stomach cancer      distant relative on dad's side of family  . Colon cancer Neg Hx   . Diabetes Paternal Grandmother    History   Social History  . Marital Status: Divorced    Spouse Name: N/A    Number of Children: N/A  . Years of Education: N/A   Occupational History  . Not on file.   Social History Main Topics  . Smoking status: Former Smoker -- 0.50 packs/day for 15 years    Types: Cigarettes    Quit date: 04/29/1991  . Smokeless tobacco: Never Used  . Alcohol Use: No     Comment: occ wine; not now  . Drug Use: No     Comment: crack/cocaine 20 yrs ago  . Sexual Activity: No   Other Topics Concern  . Not on file   Social History Narrative  . No narrative on file    Review of Systems: Constitutional: Negative for fever,  chills, diaphoresis, activity change, appetite change and fatigue. HENT: Negative for ear pain, nosebleeds, congestion, facial swelling, rhinorrhea, neck pain, neck stiffness and ear discharge.  Eyes: Negative for pain, discharge, redness, itching and visual disturbance. Respiratory: Negative for cough, choking, chest tightness, shortness of breath, wheezing and stridor.  Cardiovascular: Negative for chest pain, palpitations and leg swelling. Gastrointestinal: Negative for abdominal distention. Genitourinary: Negative for dysuria, urgency, frequency, hematuria, flank pain, decreased urine volume, difficulty urinating and dyspareunia.  Musculoskeletal: Negative for back pain, joint swelling, arthralgia and gait problem. Neurological: Negative for dizziness, tremors, seizures, syncope, facial asymmetry, speech difficulty, weakness, light-headedness, numbness and headaches.  Hematological: Negative for adenopathy. Does not bruise/bleed easily. Psychiatric/Behavioral: Negative for hallucinations, behavioral problems, confusion, dysphoric mood, decreased concentration and agitation.    Objective:   Filed Vitals:   12/08/13 1645  BP: 157/95  Pulse: 73  Temp: 98.2 F (36.8 C)  Resp: 14    Physical Exam: Constitutional: Patient appears well-developed and well-nourished. No distress. HENT: Normocephalic, atraumatic, External right and left ear normal. Oropharynx is clear and moist. Some nasal congestion no sinus tenderness. Eyes: Conjunctivae and EOM are normal. PERRLA, no scleral icterus. Neck: Normal ROM. Neck supple. No JVD. No tracheal deviation. No thyromegaly. CVS: RRR, S1/S2 +, no murmurs, no gallops, no carotid bruit.  Pulmonary: Effort and breath sounds normal, no stridor, rhonchi, wheezes, rales.  Abdominal: Soft. BS +, no distension, tenderness, rebound or guarding.  Musculoskeletal: Normal range of motion. No edema and no tenderness.  Neuro: Alert. Normal reflexes, muscle tone  coordination. No cranial nerve deficit. Skin: Skin is warm and dry. No rash noted. Not diaphoretic. No erythema. No pallor. Psychiatric: Normal mood and affect. Behavior, judgment, thought content normal.  Lab Results  Component Value Date   WBC 7.4 11/26/2013   HGB 10.6* 11/26/2013   HCT 33.4* 11/26/2013   MCV 95.7 11/26/2013   PLT 427* 11/26/2013   Lab Results  Component Value Date   CREATININE 0.50 11/27/2013   BUN 8 11/27/2013   NA 141 11/27/2013   K 4.1 11/27/2013   CL 107 11/27/2013   CO2 22 11/27/2013    Lab Results  Component Value Date   HGBA1C 10.4* 11/25/2013   Lipid Panel     Component Value Date/Time   CHOL  Value: 162        ATP III CLASSIFICATION:  <200     mg/dL   Desirable  200-239  mg/dL   Borderline High  >=240  mg/dL   High        09/03/2008 0450   TRIG 70 09/03/2008 0450   HDL 60 09/03/2008 0450   CHOLHDL 2.7 09/03/2008 0450   VLDL 14 09/03/2008 0450   LDLCALC  Value: 88        Total Cholesterol/HDL:CHD Risk Coronary Heart Disease Risk Table                     Men   Women  1/2 Average Risk   3.4   3.3  Average Risk       5.0   4.4  2 X Average Risk   9.6   7.1  3 X Average Risk  23.4   11.0        Use the calculated Patient Ratio above and the CHD Risk Table to determine the patient's CHD Risk.        ATP III CLASSIFICATION (LDL):  <100     mg/dL   Optimal  100-129  mg/dL   Near or Above                    Optimal  130-159  mg/dL   Borderline  160-189  mg/dL   High  >190     mg/dL   Very High 09/03/2008 0450       Assessment and plan:   1. DM (diabetes mellitus) type 2, uncontrolled, with ketoacidosis Results for orders placed in visit on 12/08/13  GLUCOSE, POCT (MANUAL RESULT ENTRY)      Result Value Ref Range   POC Glucose 45 (*) 70 - 99 mg/dl  GLUCOSE, POCT (MANUAL RESULT ENTRY)      Result Value Ref Range   POC Glucose 60 (*) 70 - 99 mg/dl   Patient is elevated about hypoglycemic symptoms, she will decrease the dose of insulin and modify her diet. - insulin aspart  protamine- aspart (NOVOLOG MIX 70/30) (70-30) 100 UNIT/ML injection; Inject 0.3 mLs (30 Units total) into the skin 2 (two) times daily with a meal.  Dispense: 10 mL; Refill: 11  2. HTN (hypertension) Advised for DASH diet continue with losartan/thiazide.  3. Chronic low back pain Following her with the specialist  4. Screening Baseline blood work - CBC with Differential - COMPLETE METABOLIC PANEL WITH GFR - TSH - Lipid panel - Vit D  25 hydroxy (rtn osteoporosis monitoring)  5. Nasal congestion  - fluticasone (FLONASE) 50 MCG/ACT nasal spray; Place 1 spray into both nostrils daily.  Dispense: 16 g; Refill: 3      Health Maintenance -Colonoscopy: uptodate   -Mammogram: ordered   Return in about 3 months (around 03/10/2014) for diabetes, hypertension.   Lorayne Marek, MD

## 2013-12-08 NOTE — Patient Instructions (Addendum)
Diabetes Meal Planning Guide The diabetes meal planning guide is a tool to help you plan your meals and snacks. It is important for people with diabetes to manage their blood glucose (sugar) levels. Choosing the right foods and the right amounts throughout your day will help control your blood glucose. Eating right can even help you improve your blood pressure and reach or maintain a healthy weight. CARBOHYDRATE COUNTING MADE EASY When you eat carbohydrates, they turn to sugar. This raises your blood glucose level. Counting carbohydrates can help you control this level so you feel better. When you plan your meals by counting carbohydrates, you can have more flexibility in what you eat and balance your medicine with your food intake. Carbohydrate counting simply means adding up the total amount of carbohydrate grams in your meals and snacks. Try to eat about the same amount at each meal. Foods with carbohydrates are listed below. Each portion below is 1 carbohydrate serving or 15 grams of carbohydrates. Ask your dietician how many grams of carbohydrates you should eat at each meal or snack. Grains and Starches  1 slice bread.   English muffin or hotdog/hamburger bun.   cup cold cereal (unsweetened).   cup cooked pasta or rice.   cup starchy vegetables (corn, potatoes, peas, beans, winter squash).  1 tortilla (6 inches).   bagel.  1 waffle or pancake (size of a CD).   cup cooked cereal.  4 to 6 small crackers. *Whole grain is recommended. Fruit  1 cup fresh unsweetened berries, melon, papaya, pineapple.  1 small fresh fruit.   banana or mango.   cup fruit juice (4 oz unsweetened).   cup canned fruit in natural juice or water.  2 tbs dried fruit.  12 to 15 grapes or cherries. Milk and Yogurt  1 cup fat-free or 1% milk.  1 cup soy milk.  6 oz light yogurt with sugar-free sweetener.  6 oz low-fat soy yogurt.  6 oz plain yogurt. Vegetables  1 cup raw or  cup  cooked is counted as 0 carbohydrates or a "free" food.  If you eat 3 or more servings at 1 meal, count them as 1 carbohydrate serving. Other Carbohydrates   oz chips or pretzels.   cup ice cream or frozen yogurt.   cup sherbet or sorbet.  2 inch square cake, no frosting.  1 tbs honey, sugar, jam, jelly, or syrup.  2 small cookies.  3 squares of graham crackers.  3 cups popcorn.  6 crackers.  1 cup broth-based soup.  Count 1 cup casserole or other mixed foods as 2 carbohydrate servings.  Foods with less than 20 calories in a serving may be counted as 0 carbohydrates or a "free" food. You may want to purchase a book or computer software that lists the carbohydrate gram counts of different foods. In addition, the nutrition facts panel on the labels of the foods you eat are a good source of this information. The label will tell you how big the serving size is and the total number of carbohydrate grams you will be eating per serving. Divide this number by 15 to obtain the number of carbohydrate servings in a portion. Remember, 1 carbohydrate serving equals 15 grams of carbohydrate. SERVING SIZES Measuring foods and serving sizes helps you make sure you are getting the right amount of food. The list below tells how big or small some common serving sizes are.  1 oz.........4 stacked dice.  3 oz.........Deck of cards.  1 tsp........Tip   of little finger.  1 tbs........Thumb.  2 tbs........Golf ball.   cup.......Half of a fist.  1 cup........A fist. SAMPLE DIABETES MEAL PLAN Below is a sample meal plan that includes foods from the grain and starches, dairy, vegetable, fruit, and meat groups. A dietician can individualize a meal plan to fit your calorie needs and tell you the number of servings needed from each food group. However, controlling the total amount of carbohydrates in your meal or snack is more important than making sure you include all of the food groups at every  meal. You may interchange carbohydrate containing foods (dairy, starches, and fruits). The meal plan below is an example of a 2000 calorie diet using carbohydrate counting. This meal plan has 17 carbohydrate servings. Breakfast  1 cup oatmeal (2 carb servings).   cup light yogurt (1 carb serving).  1 cup blueberries (1 carb serving).   cup almonds. Snack  1 large apple (2 carb servings).  1 low-fat string cheese stick. Lunch  Chicken breast salad.  1 cup spinach.   cup chopped tomatoes.  2 oz chicken breast, sliced.  2 tbs low-fat Italian dressing.  12 whole-wheat crackers (2 carb servings).  12 to 15 grapes (1 carb serving).  1 cup low-fat milk (1 carb serving). Snack  1 cup carrots.   cup hummus (1 carb serving). Dinner  3 oz broiled salmon.  1 cup brown rice (3 carb servings). Snack  1  cups steamed broccoli (1 carb serving) drizzled with 1 tsp olive oil and lemon juice.  1 cup light pudding (2 carb servings). DIABETES MEAL PLANNING WORKSHEET Your dietician can use this worksheet to help you decide how many servings of foods and what types of foods are right for you.  BREAKFAST Food Group and Servings / Carb Servings Grain/Starches __________________________________ Dairy __________________________________________ Vegetable ______________________________________ Fruit ___________________________________________ Meat __________________________________________ Fat ____________________________________________ LUNCH Food Group and Servings / Carb Servings Grain/Starches ___________________________________ Dairy ___________________________________________ Fruit ____________________________________________ Meat ___________________________________________ Fat _____________________________________________ DINNER Food Group and Servings / Carb Servings Grain/Starches ___________________________________ Dairy  ___________________________________________ Fruit ____________________________________________ Meat ___________________________________________ Fat _____________________________________________ SNACKS Food Group and Servings / Carb Servings Grain/Starches ___________________________________ Dairy ___________________________________________ Vegetable _______________________________________ Fruit ____________________________________________ Meat ___________________________________________ Fat _____________________________________________ DAILY TOTALS Starches _________________________ Vegetable ________________________ Fruit ____________________________ Dairy ____________________________ Meat ____________________________ Fat ______________________________ Document Released: 04/05/2005 Document Revised: 10/01/2011 Document Reviewed: 02/14/2009 ExitCare Patient Information 2014 [employer]. DASH Diet The DASH diet stands for "Dietary Approaches to Stop Hypertension." It is a healthy eating plan that has been shown to reduce high blood pressure (hypertension) in as little as 14 days, while also possibly providing other significant health benefits. These other health benefits include reducing the risk of breast cancer after menopause and reducing the risk of type 2 diabetes, heart disease, colon cancer, and stroke. Health benefits also include weight loss and slowing kidney failure in patients with chronic kidney disease.  DIET GUIDELINES  Limit salt (sodium). Your diet should contain less than 1500 mg of sodium daily.  Limit refined or processed carbohydrates. Your diet should include mostly whole grains. Desserts and added sugars should be used sparingly.  Include small amounts of heart-healthy fats. These types of fats include nuts, oils, and tub margarine. Limit saturated and trans fats. These fats have been shown to be harmful in the body. CHOOSING FOODS  The following food groups  are based on a 2000 calorie diet. See your Registered Dietitian for individual calorie needs. Grains and Grain Products (6 to 8 servings daily)  Eat More Often:   Whole-wheat bread, brown rice, whole-grain or wheat pasta, quinoa, popcorn without added fat or salt (air popped).  Eat Less Often: White bread, white pasta, white rice, cornbread. Vegetables (4 to 5 servings daily)  Eat More Often: Fresh, frozen, and canned vegetables. Vegetables may be raw, steamed, roasted, or grilled with a minimal amount of fat.  Eat Less Often/Avoid: Creamed or fried vegetables. Vegetables in a cheese sauce. Fruit (4 to 5 servings daily)  Eat More Often: All fresh, canned (in natural juice), or frozen fruits. Dried fruits without added sugar. One hundred percent fruit juice ( cup [237 mL] daily).  Eat Less Often: Dried fruits with added sugar. Canned fruit in light or heavy syrup. Lean Meats, Fish, and Poultry (2 servings or less daily. One serving is 3 to 4 oz [85-114 g]).  Eat More Often: Ninety percent or leaner ground beef, tenderloin, sirloin. Round cuts of beef, chicken breast, turkey breast. All fish. Grill, bake, or broil your meat. Nothing should be fried.  Eat Less Often/Avoid: Fatty cuts of meat, turkey, or chicken leg, thigh, or wing. Fried cuts of meat or fish. Dairy (2 to 3 servings)  Eat More Often: Low-fat or fat-free milk, low-fat plain or light yogurt, reduced-fat or part-skim cheese.  Eat Less Often/Avoid: Milk (whole, 2%).Whole milk yogurt. Full-fat cheeses. Nuts, Seeds, and Legumes (4 to 5 servings per week)  Eat More Often: All without added salt.  Eat Less Often/Avoid: Salted nuts and seeds, canned beans with added salt. Fats and Sweets (limited)  Eat More Often: Vegetable oils, tub margarines without trans fats, sugar-free gelatin. Mayonnaise and salad dressings.  Eat Less Often/Avoid: Coconut oils, palm oils, butter, stick margarine, cream, half and half, cookies, candy,  pie. FOR MORE INFORMATION The Dash Diet Eating Plan: www.dashdiet.org Document Released: 06/28/2011 Document Revised: 10/01/2011 Document Reviewed: 06/28/2011 ExitCare Patient Information 2014 [employer].  

## 2013-12-09 ENCOUNTER — Telehealth: Payer: Self-pay

## 2013-12-09 LAB — TSH: TSH: 0.909 u[IU]/mL (ref 0.350–4.500)

## 2013-12-09 LAB — VITAMIN D 25 HYDROXY (VIT D DEFICIENCY, FRACTURES): Vit D, 25-Hydroxy: 34 ng/mL (ref 30–89)

## 2013-12-09 NOTE — Telephone Encounter (Signed)
Spoke with patient this am and she is aware of her results and to maintain Low fat/low cholesterol diet

## 2013-12-09 NOTE — Telephone Encounter (Signed)
Message copied by Dorothe Pea on Wed Dec 09, 2013 11:10 AM ------      Message from: Lorayne Marek      Created: Wed Dec 09, 2013  9:27 AM       Blood work reviewed, noticed elevated triglycerides, advise patient for low fat diet. Continue with current dose of lipitor       ------

## 2013-12-15 ENCOUNTER — Ambulatory Visit: Payer: BC Managed Care – PPO

## 2013-12-22 ENCOUNTER — Other Ambulatory Visit: Payer: Self-pay | Admitting: Orthopedic Surgery

## 2013-12-22 DIAGNOSIS — Z981 Arthrodesis status: Secondary | ICD-10-CM

## 2013-12-24 ENCOUNTER — Other Ambulatory Visit: Payer: Self-pay

## 2013-12-25 ENCOUNTER — Other Ambulatory Visit (HOSPITAL_COMMUNITY): Payer: Self-pay | Admitting: Internal Medicine

## 2013-12-29 ENCOUNTER — Other Ambulatory Visit (HOSPITAL_COMMUNITY): Payer: Self-pay | Admitting: Internal Medicine

## 2013-12-30 ENCOUNTER — Other Ambulatory Visit: Payer: Self-pay

## 2014-01-05 ENCOUNTER — Ambulatory Visit
Admission: RE | Admit: 2014-01-05 | Discharge: 2014-01-05 | Disposition: A | Discharge status: Home / Self Care | Payer: No Typology Code available for payment source | Source: Ambulatory Visit | Attending: Orthopedic Surgery | Admitting: Orthopedic Surgery

## 2014-01-05 ENCOUNTER — Ambulatory Visit: Payer: Self-pay | Attending: Internal Medicine

## 2014-01-05 DIAGNOSIS — Z981 Arthrodesis status: Secondary | ICD-10-CM

## 2014-01-17 ENCOUNTER — Other Ambulatory Visit (HOSPITAL_COMMUNITY): Payer: Self-pay | Admitting: Internal Medicine

## 2014-01-18 NOTE — Progress Notes (Signed)
I have seen the patient and reviewed the daily progress note by the medical student and discussed the care of the patient with them.  See my progress note for documentation of my findings, assessment, and plans.

## 2014-02-16 ENCOUNTER — Ambulatory Visit (INDEPENDENT_AMBULATORY_CARE_PROVIDER_SITE_OTHER): Payer: Self-pay | Admitting: Obstetrics & Gynecology

## 2014-02-16 ENCOUNTER — Encounter: Payer: Self-pay | Admitting: Obstetrics & Gynecology

## 2014-02-16 VITALS — BP 120/70 | Ht 65.0 in | Wt 198.0 lb

## 2014-02-16 DIAGNOSIS — N72 Inflammatory disease of cervix uteri: Secondary | ICD-10-CM

## 2014-02-16 MED ORDER — DOXYCYCLINE HYCLATE 50 MG PO CAPS: 100.0000 mg | ORAL_CAPSULE | Freq: Two times a day (BID) | ORAL | Status: DC

## 2014-02-16 NOTE — Progress Notes (Signed)
Patient ID: [name], female   DOB: [birth year], 53 y.o.   MRN: [id number] Having discharge for about 1 month, irritating, no odor  Has history of a sensitive vaginal microenvironment  Wet prep:  +WBC  No BV  No yeast No trichomonas  Try a trial of doxycycline for the generic cervicitis/vaginitis

## 2014-03-01 ENCOUNTER — Telehealth: Payer: Self-pay | Admitting: *Deleted

## 2014-03-01 NOTE — Telephone Encounter (Signed)
Pt states was given doxycycline on 02/16/2014 by Dr. Elonda Husky now has a yeast infection.  Requesting Diflucan for yeast infection.

## 2014-03-03 MED ORDER — FLUCONAZOLE 150 MG PO TABS: 150.0000 mg | ORAL_TABLET | Freq: Once | ORAL | Status: DC

## 2014-03-05 ENCOUNTER — Other Ambulatory Visit (HOSPITAL_COMMUNITY): Payer: Self-pay | Admitting: Family Medicine

## 2014-03-05 DIAGNOSIS — Z1231 Encounter for screening mammogram for malignant neoplasm of breast: Secondary | ICD-10-CM

## 2014-03-10 ENCOUNTER — Ambulatory Visit: Payer: Medicaid Other | Attending: Internal Medicine | Admitting: Internal Medicine

## 2014-03-10 ENCOUNTER — Encounter: Payer: Self-pay | Admitting: Internal Medicine

## 2014-03-10 ENCOUNTER — Ambulatory Visit (HOSPITAL_COMMUNITY): Payer: Self-pay

## 2014-03-10 ENCOUNTER — Ambulatory Visit: Payer: Medicaid Other

## 2014-03-10 VITALS — BP 156/92 | HR 62 | Temp 99.2°F | Resp 16 | Wt 201.0 lb

## 2014-03-10 DIAGNOSIS — I10 Essential (primary) hypertension: Secondary | ICD-10-CM | POA: Diagnosis present

## 2014-03-10 DIAGNOSIS — E139 Other specified diabetes mellitus without complications: Secondary | ICD-10-CM

## 2014-03-10 DIAGNOSIS — Z7982 Long term (current) use of aspirin: Secondary | ICD-10-CM | POA: Diagnosis not present

## 2014-03-10 DIAGNOSIS — G8929 Other chronic pain: Secondary | ICD-10-CM | POA: Diagnosis not present

## 2014-03-10 DIAGNOSIS — M545 Low back pain, unspecified: Secondary | ICD-10-CM

## 2014-03-10 DIAGNOSIS — Z794 Long term (current) use of insulin: Secondary | ICD-10-CM | POA: Insufficient documentation

## 2014-03-10 DIAGNOSIS — Z87891 Personal history of nicotine dependence: Secondary | ICD-10-CM | POA: Insufficient documentation

## 2014-03-10 DIAGNOSIS — E119 Type 2 diabetes mellitus without complications: Secondary | ICD-10-CM | POA: Diagnosis not present

## 2014-03-10 DIAGNOSIS — E089 Diabetes mellitus due to underlying condition without complications: Secondary | ICD-10-CM

## 2014-03-10 DIAGNOSIS — M549 Dorsalgia, unspecified: Secondary | ICD-10-CM | POA: Diagnosis not present

## 2014-03-10 DIAGNOSIS — Z79899 Other long term (current) drug therapy: Secondary | ICD-10-CM | POA: Diagnosis not present

## 2014-03-10 DIAGNOSIS — E785 Hyperlipidemia, unspecified: Secondary | ICD-10-CM

## 2014-03-10 LAB — GLUCOSE, POCT (MANUAL RESULT ENTRY): POC Glucose: 220 mg/dl — AB (ref 70–99)

## 2014-03-10 LAB — POCT GLYCOSYLATED HEMOGLOBIN (HGB A1C): Hemoglobin A1C: 9.4

## 2014-03-10 MED ORDER — IBUPROFEN 600 MG PO TABS: 600.0000 mg | ORAL_TABLET | Freq: Three times a day (TID) | ORAL | Status: DC | PRN

## 2014-03-10 MED ORDER — SITAGLIPTIN PHOS-METFORMIN HCL 50-500 MG PO TABS: 1.0000 | ORAL_TABLET | Freq: Two times a day (BID) | ORAL | Status: DC

## 2014-03-10 NOTE — Patient Instructions (Signed)
DASH Eating Plan °DASH stands for "Dietary Approaches to Stop Hypertension." The DASH eating plan is a healthy eating plan that has been shown to reduce high blood pressure (hypertension). Additional health benefits may include reducing the risk of type 2 diabetes mellitus, heart disease, and stroke. The DASH eating plan may also help with weight loss. °WHAT DO I NEED TO KNOW ABOUT THE DASH EATING PLAN? °For the DASH eating plan, you will follow these general guidelines: °· Choose foods with a percent daily value for sodium of less than 5% (as listed on the food label). °· Use salt-free seasonings or herbs instead of table salt or sea salt. °· Check with your health care provider or pharmacist before using salt substitutes. °· Eat lower-sodium products, often labeled as "lower sodium" or "no salt added." °· Eat fresh foods. °· Eat more vegetables, fruits, and low-fat dairy products. °· Choose whole grains. Look for the word "whole" as the first word in the ingredient list. °· Choose fish and skinless chicken or turkey more often than red meat. Limit fish, poultry, and meat to 6 oz (170 g) each day. °· Limit sweets, desserts, sugars, and sugary drinks. °· Choose heart-healthy fats. °· Limit cheese to 1 oz (28 g) per day. °· Eat more home-cooked food and less restaurant, buffet, and fast food. °· Limit fried foods. °· Cook foods using methods other than frying. °· Limit canned vegetables. If you do use them, rinse them well to decrease the sodium. °· When eating at a restaurant, ask that your food be prepared with less salt, or no salt if possible. °WHAT FOODS CAN I EAT? °Seek help from a dietitian for individual calorie needs. °Grains °Whole grain or whole wheat bread. Brown rice. Whole grain or whole wheat pasta. Quinoa, bulgur, and whole grain cereals. Low-sodium cereals. Corn or whole wheat flour tortillas. Whole grain cornbread. Whole grain crackers. Low-sodium crackers. °Vegetables °Fresh or frozen vegetables  (raw, steamed, roasted, or grilled). Low-sodium or reduced-sodium tomato and vegetable juices. Low-sodium or reduced-sodium tomato sauce and paste. Low-sodium or reduced-sodium canned vegetables.  °Fruits °All fresh, canned (in natural juice), or frozen fruits. °Meat and Other Protein Products °Ground beef (85% or leaner), grass-fed beef, or beef trimmed of fat. Skinless chicken or turkey. Ground chicken or turkey. Pork trimmed of fat. All fish and seafood. Eggs. Dried beans, peas, or lentils. Unsalted nuts and seeds. Unsalted canned beans. °Dairy °Low-fat dairy products, such as skim or 1% milk, 2% or reduced-fat cheeses, low-fat ricotta or cottage cheese, or plain low-fat yogurt. Low-sodium or reduced-sodium cheeses. °Fats and Oils °Tub margarines without trans fats. Light or reduced-fat mayonnaise and salad dressings (reduced sodium). Avocado. Safflower, olive, or canola oils. Natural peanut or almond butter. °Other °Unsalted popcorn and pretzels. °The items listed above may not be a complete list of recommended foods or beverages. Contact your dietitian for more options. °WHAT FOODS ARE NOT RECOMMENDED? °Grains °White bread. White pasta. White rice. Refined cornbread. Bagels and croissants. Crackers that contain trans fat. °Vegetables °Creamed or fried vegetables. Vegetables in a cheese sauce. Regular canned vegetables. Regular canned tomato sauce and paste. Regular tomato and vegetable juices. °Fruits °Dried fruits. Canned fruit in light or heavy syrup. Fruit juice. °Meat and Other Protein Products °Fatty cuts of meat. Ribs, chicken wings, bacon, sausage, bologna, salami, chitterlings, fatback, hot dogs, bratwurst, and packaged luncheon meats. Salted nuts and seeds. Canned beans with salt. °Dairy °Whole or 2% milk, cream, half-and-half, and cream cheese. Whole-fat or sweetened yogurt. Full-fat   cheeses or blue cheese. Nondairy creamers and whipped toppings. Processed cheese, cheese spreads, or cheese  curds. °Condiments °Onion and garlic salt, seasoned salt, table salt, and sea salt. Canned and packaged gravies. Worcestershire sauce. Tartar sauce. Barbecue sauce. Teriyaki sauce. Soy sauce, including reduced sodium. Steak sauce. Fish sauce. Oyster sauce. Cocktail sauce. Horseradish. Ketchup and mustard. Meat flavorings and tenderizers. Bouillon cubes. Hot sauce. Tabasco sauce. Marinades. Taco seasonings. Relishes. °Fats and Oils °Butter, stick margarine, lard, shortening, ghee, and bacon fat. Coconut, palm kernel, or palm oils. Regular salad dressings. °Other °Pickles and olives. Salted popcorn and pretzels. °The items listed above may not be a complete list of foods and beverages to avoid. Contact your dietitian for more information. °WHERE CAN I FIND MORE INFORMATION? °National [employer], [employer], [employer]: www.nhlbi.nih.gov/health/health-topics/topics/dash/ °Document Released: 06/28/2011 Document Revised: 11/23/2013 Document Reviewed: 05/13/2013 °ExitCare® Patient Information ©2015 [employer]. This information is not intended to replace advice given to you by your health care provider. Make sure you discuss any questions you have with your health care provider. ° °

## 2014-03-10 NOTE — Progress Notes (Signed)
MRN: [id number] Name: [name]  Sex: female Age: 53 y.o. DOB: 1961/06/22  Allergies: Review of patient's allergies indicates no known allergies.  Chief Complaint  Patient presents with  . Follow-up    HPI: Patient is 53 y.o. female who has history of hypertension hyperlipidemia diabetes comes today for followup, she has been taking NovoLog 30 units twice a day, her h hemoglobin A1c is improved, as per patient she used to be on metformin in the past and which helped her better blood sugar control, her blood pressures today elevated as per patient she has not taken the medication, denies any acute symptoms, previous blood work reviewed with patient. The patient has history of chronic back pain and is requesting prescription for ibuprofen, she follows up with the pain specialist as well.  Past Medical History  Diagnosis Date  . Diabetes mellitus     h/o dka  . Depression   . Constipation   . Fibromyalgia   . Anemia   . Anxiety     Past Surgical History  Procedure Laterality Date  . Knee surgery    . Cystectomy    . Shoulder surgery      right  . Endometrial ablation    . Colonoscopy  02/26/2012    SLF: Internal hemorrhoids/TCS IN 10 YEARS WITH PROPOFOL  . Lumbar laminectomy/decompression microdiscectomy N/A 05/14/2013    Procedure: RIGHT L4-L5 DECOMPRESSION AND CYST REMOVAL;  Surgeon: Melina Schools, MD;  Location: [facility];  Service: Orthopedics;  Laterality: N/A;      Medication List       This list is accurate as of: 03/10/14 12:19 PM.  Always use your most recent med list.               aspirin EC 81 MG tablet  Take 81 mg by mouth daily.     atorvastatin 40 MG tablet  Commonly known as:  LIPITOR  Take 40 mg by mouth at bedtime.     diazepam 10 MG tablet  Commonly known as:  VALIUM  Take 10 mg by mouth every 12 (twelve) hours as needed for anxiety.     docusate sodium 100 MG capsule  Commonly known as:  COLACE  Take 1 capsule (100 mg total) by  mouth 3 (three) times daily as needed for constipation.     doxycycline 50 MG capsule  Commonly known as:  VIBRAMYCIN  Take 2 capsules (100 mg total) by mouth 2 (two) times daily.     fexofenadine 180 MG tablet  Commonly known as:  ALLEGRA  Take 180 mg by mouth daily.     fluconazole 150 MG tablet  Commonly known as:  DIFLUCAN  Take 1 tablet (150 mg total) by mouth once. Take the second tablet 3 days after the first one.     fluticasone 50 MCG/ACT nasal spray  Commonly known as:  FLONASE  Place 1 spray into both nostrils daily.     gabapentin 300 MG capsule  Commonly known as:  NEURONTIN  Take 1 capsule (300 mg total) by mouth 3 (three) times daily.     HYDROcodone-acetaminophen 5-325 MG per tablet  Commonly known as:  NORCO/VICODIN  Take 1 tablet by mouth every 6 (six) hours as needed for moderate pain.     ibuprofen 600 MG tablet  Commonly known as:  ADVIL,MOTRIN  Take 1 tablet (600 mg total) by mouth every 8 (eight) hours as needed.     insulin aspart protamine- aspart (70-30)  100 UNIT/ML injection  Commonly known as:  NOVOLOG MIX 70/30  Inject 0.3 mLs (30 Units total) into the skin 2 (two) times daily with a meal.     losartan-hydrochlorothiazide 100-12.5 MG per tablet  Commonly known as:  HYZAAR  Take 1 tablet by mouth daily.     meloxicam 15 MG tablet  Commonly known as:  MOBIC  Take 1 tablet (15 mg total) by mouth daily.     metoCLOPramide 10 MG tablet  Commonly known as:  REGLAN  Take 1 tablet (10 mg total) by mouth every 8 (eight) hours as needed (headache with nausea).     omeprazole 40 MG capsule  Commonly known as:  PRILOSEC  Take 1 capsule (40 mg total) by mouth daily.     potassium chloride 10 MEQ tablet  Commonly known as:  K-DUR,KLOR-CON  Take 10 mEq by mouth daily.     sitaGLIPtin-metformin 50-500 MG per tablet  Commonly known as:  JANUMET  Take 1 tablet by mouth 2 (two) times daily with a meal.     traZODone 100 MG tablet  Commonly known  as:  DESYREL  Take 100 mg by mouth at bedtime as needed for sleep.        Meds ordered this encounter  Medications  . sitaGLIPtin-metformin (JANUMET) 50-500 MG per tablet    Sig: Take 1 tablet by mouth 2 (two) times daily with a meal.    Dispense:  60 tablet    Refill:  3  . ibuprofen (ADVIL,MOTRIN) 600 MG tablet    Sig: Take 1 tablet (600 mg total) by mouth every 8 (eight) hours as needed.    Dispense:  30 tablet    Refill:  1    Immunization History  Administered Date(s) Administered  . Influenza Split 04/14/2012  . Pneumococcal Polysaccharide-23 08/02/2009, 05/18/2013  . Tdap 05/18/2012    Family History  Problem Relation Age of Onset  . Hypertension Mother   . Cancer Mother   . Asthma Sister   . Asthma Brother   . Stomach cancer      distant relative on dad's side of family  . Colon cancer Neg Hx   . Diabetes Paternal Grandmother     History  Substance Use Topics  . Smoking status: Former Smoker -- 0.50 packs/day for 15 years    Types: Cigarettes    Quit date: 04/29/1991  . Smokeless tobacco: Never Used  . Alcohol Use: No     Comment: occ wine; not now    Review of Systems   As noted in HPI  Filed Vitals:   03/10/14 1204  BP: 156/92  Pulse: 62  Temp: 99.2 F (37.3 C)  Resp: 16    Physical Exam  Physical Exam  Constitutional: No distress.  Eyes: EOM are normal. Pupils are equal, round, and reactive to light.  Cardiovascular: Normal rate and regular rhythm.   Pulmonary/Chest: Breath sounds normal. No respiratory distress. She has no wheezes. She has no rales.  Musculoskeletal: She exhibits no edema.    CBC    Component Value Date/Time   WBC 8.4 12/08/2013 1742   RBC 3.77* 12/08/2013 1742   HGB 11.5* 12/08/2013 1742   HCT 34.8* 12/08/2013 1742   PLT 539* 12/08/2013 1742   MCV 92.3 12/08/2013 1742   LYMPHSABS 2.9 12/08/2013 1742   MONOABS 0.8 12/08/2013 1742   EOSABS 0.1 12/08/2013 1742   BASOSABS 0.0 12/08/2013 1742    CMP     Component  Value  Date/Time   NA 143 12/08/2013 1742   K 3.8 12/08/2013 1742   CL 105 12/08/2013 1742   CO2 28 12/08/2013 1742   GLUCOSE 72 12/08/2013 1742   BUN 14 12/08/2013 1742   CREATININE 0.71 12/08/2013 1742   CREATININE 0.50 11/27/2013 0600   CALCIUM 8.8 12/08/2013 1742   PROT 6.1 12/08/2013 1742   ALBUMIN 3.6 12/08/2013 1742   AST 17 12/08/2013 1742   ALT 15 12/08/2013 1742   ALKPHOS 101 12/08/2013 1742   BILITOT 0.2 12/08/2013 1742   GFRNONAA >89 12/08/2013 1742   GFRNONAA >90 11/27/2013 0600   GFRAA >89 12/08/2013 1742   GFRAA >90 11/27/2013 0600    Lab Results  Component Value Date/Time   CHOL 244* 12/08/2013  5:42 PM    No components found with this basename: hga1c    Lab Results  Component Value Date/Time   AST 17 12/08/2013  5:42 PM    Assessment and Plan  Diabetes mellitus due to underlying condition without complications - Plan:  Results for orders placed in visit on 03/10/14  GLUCOSE, POCT (MANUAL RESULT ENTRY)      Result Value Ref Range   POC Glucose 220 (*) 70 - 99 mg/dl  POCT GLYCOSYLATED HEMOGLOBIN (HGB A1C)      Result Value Ref Range   Hemoglobin A1C 9.4     , HgB A1c is trending down, she'll continue with the NovoLog 30 units twice a day, I have started patient on sitaGLIPtin-metformin (JANUMET) 50-500 MG per tablet, advised to continue with diabetes meal planning, fingerstick glucose monitoring. Will repeat A1c in 3 months.  Essential hypertension Blood pressure is elevated, advise patient for DASH diet continue with lisinopril/hydrochlorothiazide.  Other and unspecified hyperlipidemia Currently patient is on Lipitor 40 mg daily, will repeat fasting lipid panel on the next visit  Chronic low back pain - Plan: ibuprofen (ADVIL,MOTRIN) 600 MG tablet  Health Maintenance -Colonoscopy: uptodate   -Mammogram: already scheduled   Return in about 3 months (around 06/10/2014) for diabetes, hypertension, hyperipidemia.  Lorayne Marek, MD

## 2014-03-10 NOTE — Progress Notes (Signed)
Patient is here for follow up on her diabetes

## 2014-03-12 ENCOUNTER — Inpatient Hospital Stay (HOSPITAL_COMMUNITY): Admission: RE | Admit: 2014-03-12 | Payer: Self-pay | Source: Ambulatory Visit

## 2014-03-18 ENCOUNTER — Other Ambulatory Visit: Payer: Self-pay

## 2014-03-18 DIAGNOSIS — E1110 Type 2 diabetes mellitus with ketoacidosis without coma: Secondary | ICD-10-CM

## 2014-03-18 MED ORDER — INSULIN ASPART PROT & ASPART (70-30 MIX) 100 UNIT/ML ~~LOC~~ SUSP: 30.0000 [IU] | Freq: Two times a day (BID) | SUBCUTANEOUS | Status: DC

## 2014-03-31 ENCOUNTER — Ambulatory Visit (HOSPITAL_COMMUNITY): Payer: Self-pay

## 2014-04-08 ENCOUNTER — Telehealth: Payer: Self-pay | Admitting: Obstetrics & Gynecology

## 2014-04-08 MED ORDER — FLUCONAZOLE 150 MG PO TABS: 150.0000 mg | ORAL_TABLET | Freq: Once | ORAL | Status: DC

## 2014-04-08 NOTE — Telephone Encounter (Signed)
Diflucan eprescribed.

## 2014-04-15 ENCOUNTER — Ambulatory Visit (HOSPITAL_COMMUNITY)
Admission: RE | Admit: 2014-04-15 | Discharge: 2014-04-15 | Disposition: A | Discharge status: Home / Self Care | Payer: Medicaid Other | Source: Ambulatory Visit | Attending: Internal Medicine | Admitting: Internal Medicine

## 2014-04-15 DIAGNOSIS — Z139 Encounter for screening, unspecified: Secondary | ICD-10-CM

## 2014-05-04 ENCOUNTER — Other Ambulatory Visit: Payer: Self-pay

## 2014-05-24 ENCOUNTER — Encounter: Payer: Self-pay | Admitting: Internal Medicine

## 2014-06-21 ENCOUNTER — Ambulatory Visit: Payer: Self-pay | Admitting: Internal Medicine

## 2014-06-25 ENCOUNTER — Ambulatory Visit: Payer: Self-pay | Admitting: Internal Medicine

## 2014-07-02 ENCOUNTER — Encounter: Payer: Self-pay | Admitting: Internal Medicine

## 2014-07-02 ENCOUNTER — Ambulatory Visit: Payer: BC Managed Care – PPO | Attending: Internal Medicine | Admitting: Internal Medicine

## 2014-07-02 VITALS — BP 158/88 | HR 70 | Temp 98.0°F | Resp 16 | Wt 193.2 lb

## 2014-07-02 DIAGNOSIS — E1165 Type 2 diabetes mellitus with hyperglycemia: Secondary | ICD-10-CM | POA: Insufficient documentation

## 2014-07-02 DIAGNOSIS — J0100 Acute maxillary sinusitis, unspecified: Secondary | ICD-10-CM

## 2014-07-02 DIAGNOSIS — Z7982 Long term (current) use of aspirin: Secondary | ICD-10-CM | POA: Insufficient documentation

## 2014-07-02 DIAGNOSIS — F419 Anxiety disorder, unspecified: Secondary | ICD-10-CM | POA: Diagnosis not present

## 2014-07-02 DIAGNOSIS — Z792 Long term (current) use of antibiotics: Secondary | ICD-10-CM | POA: Diagnosis not present

## 2014-07-02 DIAGNOSIS — Z79899 Other long term (current) drug therapy: Secondary | ICD-10-CM | POA: Diagnosis not present

## 2014-07-02 DIAGNOSIS — I10 Essential (primary) hypertension: Secondary | ICD-10-CM | POA: Diagnosis not present

## 2014-07-02 DIAGNOSIS — K5900 Constipation, unspecified: Secondary | ICD-10-CM | POA: Diagnosis not present

## 2014-07-02 DIAGNOSIS — R0981 Nasal congestion: Secondary | ICD-10-CM

## 2014-07-02 DIAGNOSIS — Z794 Long term (current) use of insulin: Secondary | ICD-10-CM | POA: Diagnosis not present

## 2014-07-02 DIAGNOSIS — Z87891 Personal history of nicotine dependence: Secondary | ICD-10-CM | POA: Insufficient documentation

## 2014-07-02 DIAGNOSIS — E139 Other specified diabetes mellitus without complications: Secondary | ICD-10-CM

## 2014-07-02 DIAGNOSIS — E785 Hyperlipidemia, unspecified: Secondary | ICD-10-CM | POA: Insufficient documentation

## 2014-07-02 DIAGNOSIS — Z791 Long term (current) use of non-steroidal anti-inflammatories (NSAID): Secondary | ICD-10-CM | POA: Diagnosis not present

## 2014-07-02 LAB — GLUCOSE, POCT (MANUAL RESULT ENTRY): POC GLUCOSE: 192 mg/dL — AB (ref 70–99)

## 2014-07-02 LAB — POCT GLYCOSYLATED HEMOGLOBIN (HGB A1C): Hemoglobin A1C: 10

## 2014-07-02 MED ORDER — AMLODIPINE BESYLATE 2.5 MG PO TABS: 2.5000 mg | ORAL_TABLET | Freq: Every day | ORAL | Status: DC

## 2014-07-02 MED ORDER — FLUTICASONE PROPIONATE 50 MCG/ACT NA SUSP: 1.0000 | Freq: Every day | NASAL | Status: DC

## 2014-07-02 MED ORDER — AMOXICILLIN-POT CLAVULANATE 875-125 MG PO TABS: 1.0000 | ORAL_TABLET | Freq: Two times a day (BID) | ORAL | Status: DC

## 2014-07-02 MED ORDER — SITAGLIPTIN PHOS-METFORMIN HCL 50-1000 MG PO TABS: 1.0000 | ORAL_TABLET | Freq: Two times a day (BID) | ORAL | Status: DC

## 2014-07-02 NOTE — Patient Instructions (Signed)
Diabetes Mellitus and Food It is important for you to manage your blood sugar (glucose) level. Your blood glucose level can be greatly affected by what you eat. Eating healthier foods in the appropriate amounts throughout the day at about the same time each day will help you control your blood glucose level. It can also help slow or prevent worsening of your diabetes mellitus. Healthy eating may even help you improve the level of your blood pressure and reach or maintain a healthy weight.  HOW CAN FOOD AFFECT ME? Carbohydrates Carbohydrates affect your blood glucose level more than any other type of food. Your dietitian will help you determine how many carbohydrates to eat at each meal and teach you how to count carbohydrates. Counting carbohydrates is important to keep your blood glucose at a healthy level, especially if you are using insulin or taking certain medicines for diabetes mellitus. Alcohol Alcohol can cause sudden decreases in blood glucose (hypoglycemia), especially if you use insulin or take certain medicines for diabetes mellitus. Hypoglycemia can be a life-threatening condition. Symptoms of hypoglycemia (sleepiness, dizziness, and disorientation) are similar to symptoms of having too much alcohol.  If your health care provider has given you approval to drink alcohol, do so in moderation and use the following guidelines:  Women should not have more than one drink per day, and men should not have more than two drinks per day. One drink is equal to:  12 oz of beer.  5 oz of wine.  1 oz of hard liquor.  Do not drink on an empty stomach.  Keep yourself hydrated. Have water, diet soda, or unsweetened iced tea.  Regular soda, juice, and other mixers might contain a lot of carbohydrates and should be counted. WHAT FOODS ARE NOT RECOMMENDED? As you make food choices, it is important to remember that all foods are not the same. Some foods have fewer nutrients per serving than other  foods, even though they might have the same number of calories or carbohydrates. It is difficult to get your body what it needs when you eat foods with fewer nutrients. Examples of foods that you should avoid that are high in calories and carbohydrates but low in nutrients include:  Trans fats (most processed foods list trans fats on the Nutrition Facts label).  Regular soda.  Juice.  Candy.  Sweets, such as cake, pie, doughnuts, and cookies.  Fried foods. WHAT FOODS CAN I EAT? Have nutrient-rich foods, which will nourish your body and keep you healthy. The food you should eat also will depend on several factors, including:  The calories you need.  The medicines you take.  Your weight.  Your blood glucose level.  Your blood pressure level.  Your cholesterol level. You also should eat a variety of foods, including:  Protein, such as meat, poultry, fish, tofu, nuts, and seeds (lean animal proteins are best).  Fruits.  Vegetables.  Dairy products, such as milk, cheese, and yogurt (low fat is best).  Breads, grains, pasta, cereal, rice, and beans.  Fats such as olive oil, trans fat-free margarine, canola oil, avocado, and olives. DOES EVERYONE WITH DIABETES MELLITUS HAVE THE SAME MEAL PLAN? Because every person with diabetes mellitus is different, there is not one meal plan that works for everyone. It is very important that you meet with a dietitian who will help you create a meal plan that is just right for you. Document Released: 04/05/2005 Document Revised: 07/14/2013 Document Reviewed: 06/05/2013 ExitCare Patient Information 2015 [employer]. This   information is not intended to replace advice given to you by your health care provider. Make sure you discuss any questions you have with your health care provider. DASH Eating Plan DASH stands for "Dietary Approaches to Stop Hypertension." The DASH eating plan is a healthy eating plan that has been shown to reduce high  blood pressure (hypertension). Additional health benefits may include reducing the risk of type 2 diabetes mellitus, heart disease, and stroke. The DASH eating plan may also help with weight loss. WHAT DO I NEED TO KNOW ABOUT THE DASH EATING PLAN? For the DASH eating plan, you will follow these general guidelines:  Choose foods with a percent daily value for sodium of less than 5% (as listed on the food label).  Use salt-free seasonings or herbs instead of table salt or sea salt.  Check with your health care provider or pharmacist before using salt substitutes.  Eat lower-sodium products, often labeled as "lower sodium" or "no salt added."  Eat fresh foods.  Eat more vegetables, fruits, and low-fat dairy products.  Choose whole grains. Look for the word "whole" as the first word in the ingredient list.  Choose fish and skinless chicken or turkey more often than red meat. Limit fish, poultry, and meat to 6 oz (170 g) each day.  Limit sweets, desserts, sugars, and sugary drinks.  Choose heart-healthy fats.  Limit cheese to 1 oz (28 g) per day.  Eat more home-cooked food and less restaurant, buffet, and fast food.  Limit fried foods.  Cook foods using methods other than frying.  Limit canned vegetables. If you do use them, rinse them well to decrease the sodium.  When eating at a restaurant, ask that your food be prepared with less salt, or no salt if possible. WHAT FOODS CAN I EAT? Seek help from a dietitian for individual calorie needs. Grains Whole grain or whole wheat bread. Brown rice. Whole grain or whole wheat pasta. Quinoa, bulgur, and whole grain cereals. Low-sodium cereals. Corn or whole wheat flour tortillas. Whole grain cornbread. Whole grain crackers. Low-sodium crackers. Vegetables Fresh or frozen vegetables (raw, steamed, roasted, or grilled). Low-sodium or reduced-sodium tomato and vegetable juices. Low-sodium or reduced-sodium tomato sauce and paste. Low-sodium  or reduced-sodium canned vegetables.  Fruits All fresh, canned (in natural juice), or frozen fruits. Meat and Other Protein Products Ground beef (85% or leaner), grass-fed beef, or beef trimmed of fat. Skinless chicken or turkey. Ground chicken or turkey. Pork trimmed of fat. All fish and seafood. Eggs. Dried beans, peas, or lentils. Unsalted nuts and seeds. Unsalted canned beans. Dairy Low-fat dairy products, such as skim or 1% milk, 2% or reduced-fat cheeses, low-fat ricotta or cottage cheese, or plain low-fat yogurt. Low-sodium or reduced-sodium cheeses. Fats and Oils Tub margarines without trans fats. Light or reduced-fat mayonnaise and salad dressings (reduced sodium). Avocado. Safflower, olive, or canola oils. Natural peanut or almond butter. Other Unsalted popcorn and pretzels. The items listed above may not be a complete list of recommended foods or beverages. Contact your dietitian for more options. WHAT FOODS ARE NOT RECOMMENDED? Grains White bread. White pasta. White rice. Refined cornbread. Bagels and croissants. Crackers that contain trans fat. Vegetables Creamed or fried vegetables. Vegetables in a cheese sauce. Regular canned vegetables. Regular canned tomato sauce and paste. Regular tomato and vegetable juices. Fruits Dried fruits. Canned fruit in light or heavy syrup. Fruit juice. Meat and Other Protein Products Fatty cuts of meat. Ribs, chicken wings, bacon, sausage, bologna, salami, chitterlings, fatback, hot   dogs, bratwurst, and packaged luncheon meats. Salted nuts and seeds. Canned beans with salt. Dairy Whole or 2% milk, cream, half-and-half, and cream cheese. Whole-fat or sweetened yogurt. Full-fat cheeses or blue cheese. Nondairy creamers and whipped toppings. Processed cheese, cheese spreads, or cheese curds. Condiments Onion and garlic salt, seasoned salt, table salt, and sea salt. Canned and packaged gravies. Worcestershire sauce. Tartar sauce. Barbecue sauce.  Teriyaki sauce. Soy sauce, including reduced sodium. Steak sauce. Fish sauce. Oyster sauce. Cocktail sauce. Horseradish. Ketchup and mustard. Meat flavorings and tenderizers. Bouillon cubes. Hot sauce. Tabasco sauce. Marinades. Taco seasonings. Relishes. Fats and Oils Butter, stick margarine, lard, shortening, ghee, and bacon fat. Coconut, palm kernel, or palm oils. Regular salad dressings. Other Pickles and olives. Salted popcorn and pretzels. The items listed above may not be a complete list of foods and beverages to avoid. Contact your dietitian for more information. WHERE CAN I FIND MORE INFORMATION? National Heart, Lung, and Blood Institute: www.nhlbi.nih.gov/health/health-topics/topics/dash/ Document Released: 06/28/2011 Document Revised: 11/23/2013 Document Reviewed: 05/13/2013 ExitCare Patient Information 2015 [employer]. This information is not intended to replace advice given to you by your health care provider. Make sure you discuss any questions you have with your health care provider.  

## 2014-07-02 NOTE — Progress Notes (Signed)
MRN: [id number] Name: [name]  Sex: female Age: 53 y.o. DOB: Jul 09, 1961  Allergies: Review of patient's allergies indicates no known allergies.  Chief Complaint  Patient presents with  . Follow-up    HPI: Patient is 53 y.o. female who has to of diabetes hypertension and lipidemia comes today for followup , her blood pressure is elevated as per patient she has also noticed when she checks her blood pressure at home and it has been running high, she has been having recently stuffy nose runny nose postnasal drip headache fever chills for the last few days, for diabetes she has been taking insulin as well as on the last visit she was started on Janumet, today noticed her hemoglobin A1c has trended up compared to last visit, as per patient she takes insulin sliding scale, as per patient she has seen endocrinologist in the past but lost the followup.  Past Medical History  Diagnosis Date  . Diabetes mellitus     h/o dka  . Depression   . Constipation   . Fibromyalgia   . Anemia   . Anxiety     Past Surgical History  Procedure Laterality Date  . Knee surgery    . Cystectomy    . Shoulder surgery      right  . Endometrial ablation    . Colonoscopy  02/26/2012    SLF: Internal hemorrhoids/TCS IN 10 YEARS WITH PROPOFOL  . Lumbar laminectomy/decompression microdiscectomy N/A 05/14/2013    Procedure: RIGHT L4-L5 DECOMPRESSION AND CYST REMOVAL;  Surgeon: Melina Schools, MD;  Location: [facility];  Service: Orthopedics;  Laterality: N/A;      Medication List       This list is accurate as of: 07/02/14 10:58 AM.  Always use your most recent med list.               amLODipine 2.5 MG tablet  Commonly known as:  NORVASC  Take 1 tablet (2.5 mg total) by mouth daily.     amoxicillin-clavulanate 875-125 MG per tablet  Commonly known as:  AUGMENTIN  Take 1 tablet by mouth 2 (two) times daily.     aspirin EC 81 MG tablet  Take 81 mg by mouth daily.     atorvastatin 40  MG tablet  Commonly known as:  LIPITOR  Take 40 mg by mouth at bedtime.     diazepam 10 MG tablet  Commonly known as:  VALIUM  Take 10 mg by mouth every 12 (twelve) hours as needed for anxiety.     docusate sodium 100 MG capsule  Commonly known as:  COLACE  Take 1 capsule (100 mg total) by mouth 3 (three) times daily as needed for constipation.     doxycycline 50 MG capsule  Commonly known as:  VIBRAMYCIN  Take 2 capsules (100 mg total) by mouth 2 (two) times daily.     fexofenadine 180 MG tablet  Commonly known as:  ALLEGRA  Take 180 mg by mouth daily.     fluconazole 150 MG tablet  Commonly known as:  DIFLUCAN  Take 1 tablet (150 mg total) by mouth once. Take the second tablet 3 days after the first one.     fluconazole 150 MG tablet  Commonly known as:  DIFLUCAN  Take 1 tablet (150 mg total) by mouth once. Take the second tablet 3 days after the first one.     fluticasone 50 MCG/ACT nasal spray  Commonly known as:  FLONASE  Place 1  spray into both nostrils daily.     gabapentin 300 MG capsule  Commonly known as:  NEURONTIN  Take 1 capsule (300 mg total) by mouth 3 (three) times daily.     HYDROcodone-acetaminophen 5-325 MG per tablet  Commonly known as:  NORCO/VICODIN  Take 1 tablet by mouth every 6 (six) hours as needed for moderate pain.     ibuprofen 600 MG tablet  Commonly known as:  ADVIL,MOTRIN  Take 1 tablet (600 mg total) by mouth every 8 (eight) hours as needed.     insulin aspart protamine- aspart (70-30) 100 UNIT/ML injection  Commonly known as:  NOVOLOG MIX 70/30  Inject 0.3 mLs (30 Units total) into the skin 2 (two) times daily with a meal.     losartan-hydrochlorothiazide 100-12.5 MG per tablet  Commonly known as:  HYZAAR  Take 1 tablet by mouth daily.     meloxicam 15 MG tablet  Commonly known as:  MOBIC  Take 1 tablet (15 mg total) by mouth daily.     metoCLOPramide 10 MG tablet  Commonly known as:  REGLAN  Take 1 tablet (10 mg total) by  mouth every 8 (eight) hours as needed (headache with nausea).     omeprazole 40 MG capsule  Commonly known as:  PRILOSEC  Take 1 capsule (40 mg total) by mouth daily.     potassium chloride 10 MEQ tablet  Commonly known as:  K-DUR,KLOR-CON  Take 10 mEq by mouth daily.     sitaGLIPtin-metformin 50-1000 MG per tablet  Commonly known as:  JANUMET  Take 1 tablet by mouth 2 (two) times daily with a meal.     traZODone 100 MG tablet  Commonly known as:  DESYREL  Take 100 mg by mouth at bedtime as needed for sleep.        Meds ordered this encounter  Medications  . amoxicillin-clavulanate (AUGMENTIN) 875-125 MG per tablet    Sig: Take 1 tablet by mouth 2 (two) times daily.    Dispense:  20 tablet    Refill:  0  . fluticasone (FLONASE) 50 MCG/ACT nasal spray    Sig: Place 1 spray into both nostrils daily.    Dispense:  16 g    Refill:  3  . sitaGLIPtin-metformin (JANUMET) 50-1000 MG per tablet    Sig: Take 1 tablet by mouth 2 (two) times daily with a meal.    Dispense:  60 tablet    Refill:  3  . amLODipine (NORVASC) 2.5 MG tablet    Sig: Take 1 tablet (2.5 mg total) by mouth daily.    Dispense:  90 tablet    Refill:  3    Immunization History  Administered Date(s) Administered  . Influenza Split 04/14/2012  . Pneumococcal Polysaccharide-23 08/02/2009, 05/18/2013  . Tdap 05/18/2012    Family History  Problem Relation Age of Onset  . Hypertension Mother   . Cancer Mother   . Asthma Sister   . Asthma Brother   . Stomach cancer      distant relative on dad's side of family  . Colon cancer Neg Hx   . Diabetes Paternal Grandmother     History  Substance Use Topics  . Smoking status: Former Smoker -- 0.50 packs/day for 15 years    Types: Cigarettes    Quit date: 04/29/1991  . Smokeless tobacco: Never Used  . Alcohol Use: No     Comment: occ wine; not now    Review of Systems  HENT: Positive  for congestion, rhinorrhea and sinus pressure.   Respiratory:  Negative for chest tightness, wheezing and stridor.   Neurological: Positive for headaches.     As noted in HPI  Filed Vitals:   07/02/14 1010  BP: 158/88  Pulse: 70  Temp: 98 F (36.7 C)  Resp: 16    Physical Exam  Physical Exam  HENT:  Nasal congestion sinus tenderness   Eyes: EOM are normal. Pupils are equal, round, and reactive to light.  Neck: Neck supple.  Cardiovascular: Normal rate and regular rhythm.   Pulmonary/Chest: Breath sounds normal. No respiratory distress. She has no wheezes. She has no rales.  Musculoskeletal: She exhibits no edema.    CBC    Component Value Date/Time   WBC 8.4 12/08/2013 1742   RBC 3.77* 12/08/2013 1742   HGB 11.5* 12/08/2013 1742   HCT 34.8* 12/08/2013 1742   PLT 539* 12/08/2013 1742   MCV 92.3 12/08/2013 1742   LYMPHSABS 2.9 12/08/2013 1742   MONOABS 0.8 12/08/2013 1742   EOSABS 0.1 12/08/2013 1742   BASOSABS 0.0 12/08/2013 1742    CMP     Component Value Date/Time   NA 143 12/08/2013 1742   K 3.8 12/08/2013 1742   CL 105 12/08/2013 1742   CO2 28 12/08/2013 1742   GLUCOSE 72 12/08/2013 1742   BUN 14 12/08/2013 1742   CREATININE 0.71 12/08/2013 1742   CREATININE 0.50 11/27/2013 0600   CALCIUM 8.8 12/08/2013 1742   PROT 6.1 12/08/2013 1742   ALBUMIN 3.6 12/08/2013 1742   AST 17 12/08/2013 1742   ALT 15 12/08/2013 1742   ALKPHOS 101 12/08/2013 1742   BILITOT 0.2 12/08/2013 1742   GFRNONAA >89 12/08/2013 1742   GFRNONAA >90 11/27/2013 0600   GFRAA >89 12/08/2013 1742   GFRAA >90 11/27/2013 0600    Lab Results  Component Value Date/Time   CHOL 244* 12/08/2013 05:42 PM    No components found for: HGA1C  Lab Results  Component Value Date/Time   AST 17 12/08/2013 05:42 PM    Assessment and Plan  Other specified diabetes mellitus without complications - Plan:  Results for orders placed or performed in visit on 07/02/14  Glucose (CBG)  Result Value Ref Range   POC Glucose 192 (A) 70 - 99 mg/dl  HgB A1c    Result Value Ref Range   Hemoglobin A1C 10.0    Diabetes is uncontrolled her, hemoglobin A1c noticed to have trended up, Ambulatory referral to Endocrinology, I have increased the dose of JanumetsitaGLIPtin-metformin (JANUMET) 50-1000 MG per tablet, she'll continue with her insulin and schedule appointment with endocrinology, we'll check fastingCOMPLETE METABOLIC PANEL WITH GFR, Lipid panel  Essential hypertension - Plan: blood pressure is still uncontrolled, she'll continue with her losartan/hydrochlorothiazide medication, I have added amLODipine (NORVASC) 2.5 MG tablet, she'll come back in 2 weeks per nurse visit BP check COMPLETE METABOLIC PANEL WITH GFR  Acute maxillary sinusitis, recurrence not specified - Plan: amoxicillin-clavulanate (AUGMENTIN) 875-125 MG per tablet  Nasal congestion - Plan: fluticasone (FLONASE) 50 MCG/ACT nasal spray   Health Maintenance  -Vaccinations:  Patient will come back for flu shot   Return in about 3 months (around 10/01/2014) for diabetes, hypertension, hyperipidemia, BP check and fasting blood work in 2 weeks/Nurse Visit.  Judy Marek, MD

## 2014-07-02 NOTE — Progress Notes (Signed)
Patient here for follow up on her HTN and diabetes Complains of having headaches on and off Patient states her medications might need to be adjusted

## 2014-07-09 ENCOUNTER — Telehealth: Payer: Self-pay | Admitting: Internal Medicine

## 2014-07-09 NOTE — Telephone Encounter (Signed)
Patient left voicemal requesting that we give her a callback. I contacted patient and she states that she has Lumbar surgery scheduled ([facility]) On 07/14/14. Patient states that she dropped off paperwork 3 weeks ago at the front desk for Medical clearance for this surgery but she hasn't heard back from clinic stating whether or not the paperwork is ready. Informed patient that I would send a note to nurse. Also recommended that patient contact [facility] to request another medical clearance form to be faxed to clinic. Provided patient with fax number. Please assist patient as this is an urgent matter. Thank you

## 2014-07-12 ENCOUNTER — Telehealth: Payer: Self-pay | Admitting: Internal Medicine

## 2014-07-12 ENCOUNTER — Encounter (HOSPITAL_COMMUNITY): Payer: Self-pay

## 2014-07-12 ENCOUNTER — Encounter (HOSPITAL_COMMUNITY)
Admission: RE | Admit: 2014-07-12 | Discharge: 2014-07-12 | Disposition: A | Discharge status: Home / Self Care | Payer: BC Managed Care – PPO | Source: Ambulatory Visit | Attending: Orthopedic Surgery | Admitting: Orthopedic Surgery

## 2014-07-12 HISTORY — DX: Cardiac murmur, unspecified: R01.1

## 2014-07-12 HISTORY — DX: Gastro-esophageal reflux disease without esophagitis: K21.9

## 2014-07-12 HISTORY — DX: Essential (primary) hypertension: I10

## 2014-07-12 HISTORY — DX: Unspecified osteoarthritis, unspecified site: M19.90

## 2014-07-12 LAB — BASIC METABOLIC PANEL
Anion gap: 10 (ref 5–15)
BUN: 13 mg/dL (ref 6–23)
CO2: 28 mEq/L (ref 19–32)
CREATININE: 0.57 mg/dL (ref 0.50–1.10)
Calcium: 9.3 mg/dL (ref 8.4–10.5)
Chloride: 104 mEq/L (ref 96–112)
GFR calc non Af Amer: >90 mL/min (ref >90)
Glucose, Bld: 137 mg/dL — ABNORMAL HIGH (ref 70–99)
Potassium: 4.3 mEq/L (ref 3.7–5.3)
SODIUM: 142 meq/L (ref 137–147)

## 2014-07-12 LAB — SURGICAL PCR SCREEN
MRSA, PCR: NEGATIVE
STAPHYLOCOCCUS AUREUS: NEGATIVE

## 2014-07-12 LAB — CBC
HCT: 36.8 % (ref 36.0–46.0)
Hemoglobin: 11.7 g/dL — ABNORMAL LOW (ref 12.0–15.0)
MCH: 29.5 pg (ref 26.0–34.0)
MCHC: 31.8 g/dL (ref 30.0–36.0)
MCV: 92.7 fL (ref 78.0–100.0)
PLATELETS: 394 10*3/uL (ref 150–400)
RBC: 3.97 MIL/uL (ref 3.87–5.11)
RDW: 13.3 % (ref 11.5–15.5)
WBC: 7 10*3/uL (ref 4.0–10.5)

## 2014-07-12 LAB — TYPE AND SCREEN
ABO/RH(D): O POS
ANTIBODY SCREEN: NEGATIVE

## 2014-07-12 NOTE — Progress Notes (Signed)
Office called .message left for sherry to address permit [place]. ? Needs exact location ?L5-S1

## 2014-07-12 NOTE — Pre-Procedure Instructions (Addendum)
[facility]  07/12/2014   Your procedure is scheduled on:  07/14/14  Report to [facility] short stay admitting at 1000 AM.  Call this number if you have problems the morning of surgery: [phone]   Remember:   Do not eat food or drink liquids after midnight.   Take these medicines the morning of surgery with A SIP OF WATER: amlodipine,valium if needed,gabapentin, norco  STOP all herbel meds, nsaids (aleve,naproxen,advil,ibuprofen)  Starting now including aspirin, vitamins,meloxicam   No diabetic med or insulin day of surgery   Do not wear jewelry, make-up or nail polish.  Do not wear lotions, powders, or perfumes. You may wear deodorant.  Do not shave 48 hours prior to surgery. Men may shave face and neck.  Do not bring valuables to the hospital.  [facility] is not responsible                  for any belongings or valuables.               Contacts, dentures or bridgework may not be worn into surgery.  Leave suitcase in the car. After surgery it may be brought to your room.  For patients admitted to the hospital, discharge time is determined by your                treatment team.               Patients discharged the day of surgery will not be allowed to drive  home.  Name and phone number of your driver:   Special Instructions:  Special Instructions: [name] - Preparing for Surgery  Before surgery, you can play an important role.  Because skin is not sterile, your skin needs to be as free of germs as possible.  You can reduce the number of germs on you skin by washing with CHG (chlorahexidine gluconate) soap before surgery.  CHG is an antiseptic cleaner which kills germs and bonds with the skin to continue killing germs even after washing.  Please DO NOT use if you have an allergy to CHG or antibacterial soaps.  If your skin becomes reddened/irritated stop using the CHG and inform your nurse when you arrive at Short Stay.  Do not shave (including legs and  underarms) for at least 48 hours prior to the first CHG shower.  You may shave your face.  Please follow these instructions carefully:   1.  Shower with CHG Soap the night before surgery and the morning of Surgery.  2.  If you choose to wash your hair, wash your hair first as usual with your normal shampoo.  3.  After you shampoo, rinse your hair and body thoroughly to remove the Shampoo.  4.  Use CHG as you would any other liquid soap.  You can apply chg directly  to the skin and wash gently with scrungie or a clean washcloth.  5.  Apply the CHG Soap to your body ONLY FROM THE NECK DOWN.  Do not use on open wounds or open sores.  Avoid contact with your eyes ears, mouth and genitals (private parts).  Wash genitals (private parts)       with your normal soap.  6.  Wash thoroughly, paying special attention to the area where your surgery will be performed.  7.  Thoroughly rinse your body with warm water from the neck down.  8.  DO NOT shower/wash with your normal soap after using and rinsing off  the CHG Soap.  9.  Pat yourself dry with a clean towel.            10.  Wear clean pajamas.            11.  Place clean sheets on your bed the night of your first shower and do not sleep with pets.  Day of Surgery  Do not apply any lotions/deodorants the morning of surgery.  Please wear clean clothes to the hospital/surgery center.   Please read over the following fact sheets that you were given: Pain Booklet, Coughing and Deep Breathing, Blood Transfusion Information, MRSA Information and Surgical Site Infection Prevention

## 2014-07-12 NOTE — Telephone Encounter (Signed)
Patient came into facility to check on

## 2014-07-12 NOTE — Progress Notes (Signed)
   07/12/14 1529  OBSTRUCTIVE SLEEP APNEA  Have you ever been diagnosed with sleep apnea through a sleep study? No  Do you snore loudly (loud enough to be heard through closed doors)?  1  Do you often feel tired, fatigued, or sleepy during the daytime? 0  Has anyone observed you stop breathing during your sleep? 1  Do you have, or are you being treated for high blood pressure? 1  BMI more than 35 kg/m2? 0  Age over 54 years old? 1  Neck circumference greater than 40 cm/16 inches? 0 (14)  Gender: 0  Obstructive Sleep Apnea Score 4  Score 4 or greater  Results sent to PCP

## 2014-07-13 ENCOUNTER — Telehealth: Payer: Self-pay | Admitting: Internal Medicine

## 2014-07-13 ENCOUNTER — Other Ambulatory Visit: Payer: Self-pay

## 2014-07-13 MED ORDER — CEFAZOLIN SODIUM-DEXTROSE 2-3 GM-% IV SOLR
2.0000 g | INTRAVENOUS | Status: AC
  Administered 2014-07-14: 2 g via INTRAVENOUS
  Filled 2014-07-13: qty 50

## 2014-07-13 MED ORDER — DEXAMETHASONE SODIUM PHOSPHATE 4 MG/ML IJ SOLN
10.0000 mg | Freq: Once | INTRAMUSCULAR | Status: AC
  Administered 2014-07-14: 10 mg via INTRAVENOUS
  Filled 2014-07-13: qty 3

## 2014-07-13 NOTE — Telephone Encounter (Signed)
Patient is requesting a script for fluconazole (DIFLUCAN) 150 MG tablet; patient says anti biotic she was using gave her an infection; please f/u with patient

## 2014-07-14 ENCOUNTER — Inpatient Hospital Stay (HOSPITAL_COMMUNITY): Payer: BC Managed Care – PPO | Admitting: Anesthesiology

## 2014-07-14 ENCOUNTER — Inpatient Hospital Stay (HOSPITAL_COMMUNITY)
Admission: RE | Admit: 2014-07-14 | Discharge: 2014-07-15 | DRG: 460 | Disposition: A | Discharge status: Home / Self Care | Payer: BC Managed Care – PPO | Source: Ambulatory Visit | Attending: Orthopedic Surgery | Admitting: Orthopedic Surgery

## 2014-07-14 ENCOUNTER — Encounter (HOSPITAL_COMMUNITY): Payer: Self-pay | Admitting: *Deleted

## 2014-07-14 ENCOUNTER — Inpatient Hospital Stay (HOSPITAL_COMMUNITY): Payer: BC Managed Care – PPO

## 2014-07-14 ENCOUNTER — Encounter (HOSPITAL_COMMUNITY)
Admission: RE | Disposition: A | Discharge status: Home / Self Care | Payer: Self-pay | Source: Ambulatory Visit | Attending: Orthopedic Surgery

## 2014-07-14 DIAGNOSIS — Y838 Other surgical procedures as the cause of abnormal reaction of the patient, or of later complication, without mention of misadventure at the time of the procedure: Secondary | ICD-10-CM | POA: Diagnosis present

## 2014-07-14 DIAGNOSIS — M25551 Pain in right hip: Secondary | ICD-10-CM | POA: Diagnosis present

## 2014-07-14 DIAGNOSIS — F419 Anxiety disorder, unspecified: Secondary | ICD-10-CM | POA: Diagnosis present

## 2014-07-14 DIAGNOSIS — Z794 Long term (current) use of insulin: Secondary | ICD-10-CM

## 2014-07-14 DIAGNOSIS — Z87891 Personal history of nicotine dependence: Secondary | ICD-10-CM

## 2014-07-14 DIAGNOSIS — E119 Type 2 diabetes mellitus without complications: Secondary | ICD-10-CM | POA: Diagnosis present

## 2014-07-14 DIAGNOSIS — M960 Pseudarthrosis after fusion or arthrodesis: Principal | ICD-10-CM | POA: Diagnosis present

## 2014-07-14 DIAGNOSIS — M1990 Unspecified osteoarthritis, unspecified site: Secondary | ICD-10-CM | POA: Diagnosis present

## 2014-07-14 DIAGNOSIS — F329 Major depressive disorder, single episode, unspecified: Secondary | ICD-10-CM | POA: Diagnosis present

## 2014-07-14 DIAGNOSIS — S32009K Unspecified fracture of unspecified lumbar vertebra, subsequent encounter for fracture with nonunion: Secondary | ICD-10-CM | POA: Diagnosis present

## 2014-07-14 DIAGNOSIS — K219 Gastro-esophageal reflux disease without esophagitis: Secondary | ICD-10-CM | POA: Diagnosis present

## 2014-07-14 DIAGNOSIS — I10 Essential (primary) hypertension: Secondary | ICD-10-CM | POA: Diagnosis present

## 2014-07-14 DIAGNOSIS — Z981 Arthrodesis status: Secondary | ICD-10-CM

## 2014-07-14 DIAGNOSIS — Z419 Encounter for procedure for purposes other than remedying health state, unspecified: Secondary | ICD-10-CM

## 2014-07-14 DIAGNOSIS — D649 Anemia, unspecified: Secondary | ICD-10-CM | POA: Diagnosis present

## 2014-07-14 HISTORY — PX: LUMBAR PERCUTANEOUS PEDICLE SCREW 1 LEVEL: SHX5560

## 2014-07-14 LAB — GLUCOSE, CAPILLARY
GLUCOSE-CAPILLARY: 223 mg/dL — AB (ref 70–99)
Glucose-Capillary: 149 mg/dL — ABNORMAL HIGH (ref 70–99)
Glucose-Capillary: 189 mg/dL — ABNORMAL HIGH (ref 70–99)
Glucose-Capillary: 260 mg/dL — ABNORMAL HIGH (ref 70–99)

## 2014-07-14 SURGERY — LUMBAR PERCUTANEOUS PEDICLE SCREW 1 LEVEL
Anesthesia: General | Site: Back

## 2014-07-14 MED ORDER — HYDROMORPHONE HCL 1 MG/ML IJ SOLN
0.2500 mg | INTRAMUSCULAR | Status: DC | PRN
  Administered 2014-07-14 (×6): 0.5 mg via INTRAVENOUS

## 2014-07-14 MED ORDER — FLEET ENEMA 7-19 GM/118ML RE ENEM: 1.0000 | ENEMA | Freq: Once | RECTAL | Status: AC | PRN

## 2014-07-14 MED ORDER — HYDROMORPHONE HCL 1 MG/ML IJ SOLN
INTRAMUSCULAR | Status: AC
  Filled 2014-07-14: qty 1

## 2014-07-14 MED ORDER — INSULIN ASPART 100 UNIT/ML ~~LOC~~ SOLN: 0.0000 [IU] | SUBCUTANEOUS | Status: DC

## 2014-07-14 MED ORDER — POTASSIUM CHLORIDE CRYS ER 10 MEQ PO TBCR
10.0000 meq | EXTENDED_RELEASE_TABLET | Freq: Every day | ORAL | Status: DC
  Administered 2014-07-15: 10 meq via ORAL
  Filled 2014-07-14 (×2): qty 1

## 2014-07-14 MED ORDER — SITAGLIPTIN PHOS-METFORMIN HCL 50-1000 MG PO TABS: 1.0000 | ORAL_TABLET | Freq: Two times a day (BID) | ORAL | Status: DC

## 2014-07-14 MED ORDER — PROPOFOL 10 MG/ML IV BOLUS
INTRAVENOUS | Status: DC | PRN
  Administered 2014-07-14: 140 mg via INTRAVENOUS

## 2014-07-14 MED ORDER — LIDOCAINE HCL (CARDIAC) 20 MG/ML IV SOLN
INTRAVENOUS | Status: AC
  Filled 2014-07-14: qty 5

## 2014-07-14 MED ORDER — INSULIN ASPART PROT & ASPART (70-30 MIX) 100 UNIT/ML ~~LOC~~ SUSP
30.0000 [IU] | Freq: Two times a day (BID) | SUBCUTANEOUS | Status: DC
  Administered 2014-07-15: 30 [IU] via SUBCUTANEOUS
  Filled 2014-07-14: qty 10

## 2014-07-14 MED ORDER — BUPIVACAINE-EPINEPHRINE 0.25% -1:200000 IJ SOLN
INTRAMUSCULAR | Status: DC | PRN
  Administered 2014-07-14: 10 mL

## 2014-07-14 MED ORDER — GLYCOPYRROLATE 0.2 MG/ML IJ SOLN
INTRAMUSCULAR | Status: DC | PRN
  Administered 2014-07-14: 0.4 mg via INTRAVENOUS

## 2014-07-14 MED ORDER — LOSARTAN POTASSIUM 50 MG PO TABS
100.0000 mg | ORAL_TABLET | Freq: Every day | ORAL | Status: DC
  Administered 2014-07-15: 100 mg via ORAL
  Filled 2014-07-14: qty 2

## 2014-07-14 MED ORDER — LOSARTAN POTASSIUM-HCTZ 100-12.5 MG PO TABS: 1.0000 | ORAL_TABLET | Freq: Every day | ORAL | Status: DC

## 2014-07-14 MED ORDER — ONDANSETRON HCL 4 MG/2ML IJ SOLN
INTRAMUSCULAR | Status: DC | PRN
  Administered 2014-07-14: 4 mg via INTRAVENOUS

## 2014-07-14 MED ORDER — FENTANYL CITRATE 0.05 MG/ML IJ SOLN
INTRAMUSCULAR | Status: DC | PRN
  Administered 2014-07-14: 100 ug via INTRAVENOUS
  Administered 2014-07-14: 50 ug via INTRAVENOUS
  Administered 2014-07-14: 100 ug via INTRAVENOUS
  Administered 2014-07-14: 50 ug via INTRAVENOUS

## 2014-07-14 MED ORDER — GLYCOPYRROLATE 0.2 MG/ML IJ SOLN
INTRAMUSCULAR | Status: AC
  Filled 2014-07-14: qty 2

## 2014-07-14 MED ORDER — CEFAZOLIN SODIUM 1-5 GM-% IV SOLN
1.0000 g | Freq: Three times a day (TID) | INTRAVENOUS | Status: AC
  Administered 2014-07-14 – 2014-07-15 (×2): 1 g via INTRAVENOUS
  Filled 2014-07-14 (×2): qty 50

## 2014-07-14 MED ORDER — ACETAMINOPHEN 10 MG/ML IV SOLN
1000.0000 mg | INTRAVENOUS | Status: AC
  Administered 2014-07-14: 1000 mg via INTRAVENOUS
  Filled 2014-07-14: qty 100

## 2014-07-14 MED ORDER — ACETAMINOPHEN 325 MG PO TABS: 325.0000 mg | ORAL_TABLET | ORAL | Status: DC | PRN

## 2014-07-14 MED ORDER — LACTATED RINGERS IV SOLN: INTRAVENOUS | Status: DC

## 2014-07-14 MED ORDER — METHOCARBAMOL 1000 MG/10ML IJ SOLN
500.0000 mg | Freq: Four times a day (QID) | INTRAVENOUS | Status: DC | PRN
  Filled 2014-07-14: qty 5

## 2014-07-14 MED ORDER — ACETAMINOPHEN 10 MG/ML IV SOLN
1000.0000 mg | Freq: Four times a day (QID) | INTRAVENOUS | Status: DC
  Administered 2014-07-14 – 2014-07-15 (×3): 1000 mg via INTRAVENOUS
  Filled 2014-07-14 (×4): qty 100

## 2014-07-14 MED ORDER — FENTANYL CITRATE 0.05 MG/ML IJ SOLN
INTRAMUSCULAR | Status: AC
  Filled 2014-07-14: qty 5

## 2014-07-14 MED ORDER — SODIUM CHLORIDE 0.9 % IJ SOLN
3.0000 mL | Freq: Two times a day (BID) | INTRAMUSCULAR | Status: DC
  Administered 2014-07-15: 3 mL via INTRAVENOUS

## 2014-07-14 MED ORDER — PHENYLEPHRINE HCL 10 MG/ML IJ SOLN
INTRAMUSCULAR | Status: DC | PRN
  Administered 2014-07-14: 40 ug via INTRAVENOUS
  Administered 2014-07-14: 80 ug via INTRAVENOUS
  Administered 2014-07-14: 40 ug via INTRAVENOUS
  Administered 2014-07-14: 80 ug via INTRAVENOUS

## 2014-07-14 MED ORDER — MENTHOL 3 MG MT LOZG: 1.0000 | LOZENGE | OROMUCOSAL | Status: DC | PRN

## 2014-07-14 MED ORDER — HEMOSTATIC AGENTS (NO CHARGE) OPTIME
TOPICAL | Status: DC | PRN
  Administered 2014-07-14: 1 via TOPICAL

## 2014-07-14 MED ORDER — ARTIFICIAL TEARS OP OINT
TOPICAL_OINTMENT | OPHTHALMIC | Status: AC
  Filled 2014-07-14: qty 3.5

## 2014-07-14 MED ORDER — THROMBIN 20000 UNITS EX SOLR
OROMUCOSAL | Status: DC | PRN
  Administered 2014-07-14: 14:00:00 via TOPICAL

## 2014-07-14 MED ORDER — SODIUM CHLORIDE 0.9 % IJ SOLN: 3.0000 mL | INTRAMUSCULAR | Status: DC | PRN

## 2014-07-14 MED ORDER — LIDOCAINE HCL (CARDIAC) 20 MG/ML IV SOLN
INTRAVENOUS | Status: DC | PRN
  Administered 2014-07-14: 70 mg via INTRAVENOUS

## 2014-07-14 MED ORDER — THROMBIN 20000 UNITS EX SOLR
CUTANEOUS | Status: AC
  Filled 2014-07-14: qty 20000

## 2014-07-14 MED ORDER — AMLODIPINE BESYLATE 2.5 MG PO TABS
2.5000 mg | ORAL_TABLET | Freq: Every day | ORAL | Status: DC
  Administered 2014-07-15: 2.5 mg via ORAL
  Filled 2014-07-14: qty 1

## 2014-07-14 MED ORDER — METHOCARBAMOL 500 MG PO TABS
500.0000 mg | ORAL_TABLET | Freq: Four times a day (QID) | ORAL | Status: DC | PRN
  Administered 2014-07-14 – 2014-07-15 (×3): 500 mg via ORAL
  Filled 2014-07-14 (×2): qty 1

## 2014-07-14 MED ORDER — ONDANSETRON HCL 4 MG/2ML IJ SOLN
INTRAMUSCULAR | Status: AC
  Filled 2014-07-14: qty 2

## 2014-07-14 MED ORDER — LACTATED RINGERS IV SOLN
INTRAVENOUS | Status: DC
  Administered 2014-07-14: 10:00:00 via INTRAVENOUS

## 2014-07-14 MED ORDER — INSULIN ASPART 100 UNIT/ML ~~LOC~~ SOLN
0.0000 [IU] | SUBCUTANEOUS | Status: DC
  Administered 2014-07-14: 4 [IU] via SUBCUTANEOUS
  Administered 2014-07-15 (×2): 8 [IU] via SUBCUTANEOUS
  Administered 2014-07-15: 3 [IU] via SUBCUTANEOUS
  Administered 2014-07-15: 8 [IU] via SUBCUTANEOUS

## 2014-07-14 MED ORDER — SODIUM CHLORIDE 0.9 % IV SOLN: 250.0000 mL | INTRAVENOUS | Status: DC

## 2014-07-14 MED ORDER — ROCURONIUM BROMIDE 50 MG/5ML IV SOLN
INTRAVENOUS | Status: AC
  Filled 2014-07-14: qty 1

## 2014-07-14 MED ORDER — FLUTICASONE PROPIONATE 50 MCG/ACT NA SUSP
1.0000 | Freq: Every day | NASAL | Status: DC
  Administered 2014-07-14 – 2014-07-15 (×2): 1 via NASAL
  Filled 2014-07-14: qty 16

## 2014-07-14 MED ORDER — ARTIFICIAL TEARS OP OINT
TOPICAL_OINTMENT | OPHTHALMIC | Status: DC | PRN
  Administered 2014-07-14: 1 via OPHTHALMIC

## 2014-07-14 MED ORDER — ATORVASTATIN CALCIUM 40 MG PO TABS
40.0000 mg | ORAL_TABLET | Freq: Every day | ORAL | Status: DC
  Administered 2014-07-14: 40 mg via ORAL
  Filled 2014-07-14 (×2): qty 1

## 2014-07-14 MED ORDER — DOCUSATE SODIUM 100 MG PO CAPS
100.0000 mg | ORAL_CAPSULE | Freq: Two times a day (BID) | ORAL | Status: DC
  Administered 2014-07-14 – 2014-07-15 (×2): 100 mg via ORAL
  Filled 2014-07-14 (×3): qty 1

## 2014-07-14 MED ORDER — MIDAZOLAM HCL 2 MG/2ML IJ SOLN
INTRAMUSCULAR | Status: AC
  Filled 2014-07-14: qty 2

## 2014-07-14 MED ORDER — MORPHINE SULFATE 2 MG/ML IJ SOLN
1.0000 mg | INTRAMUSCULAR | Status: DC | PRN
  Administered 2014-07-14 (×3): 2 mg via INTRAVENOUS
  Filled 2014-07-14 (×3): qty 1

## 2014-07-14 MED ORDER — NEOSTIGMINE METHYLSULFATE 10 MG/10ML IV SOLN
INTRAVENOUS | Status: DC | PRN
  Administered 2014-07-14: 3 mg via INTRAVENOUS

## 2014-07-14 MED ORDER — HYDROMORPHONE HCL 1 MG/ML IJ SOLN: 0.5000 mg | INTRAMUSCULAR | Status: DC | PRN

## 2014-07-14 MED ORDER — METFORMIN HCL 500 MG PO TABS
1000.0000 mg | ORAL_TABLET | Freq: Two times a day (BID) | ORAL | Status: DC
  Administered 2014-07-15: 1000 mg via ORAL
  Filled 2014-07-14 (×4): qty 2

## 2014-07-14 MED ORDER — OXYCODONE HCL 5 MG/5ML PO SOLN: 5.0000 mg | Freq: Once | ORAL | Status: DC | PRN

## 2014-07-14 MED ORDER — LACTATED RINGERS IV SOLN
INTRAVENOUS | Status: DC | PRN
  Administered 2014-07-14 (×2): via INTRAVENOUS

## 2014-07-14 MED ORDER — NEOSTIGMINE METHYLSULFATE 10 MG/10ML IV SOLN
INTRAVENOUS | Status: AC
  Filled 2014-07-14: qty 1

## 2014-07-14 MED ORDER — PROPOFOL 10 MG/ML IV BOLUS
INTRAVENOUS | Status: AC
  Filled 2014-07-14: qty 20

## 2014-07-14 MED ORDER — HYDROCHLOROTHIAZIDE 12.5 MG PO CAPS
12.5000 mg | ORAL_CAPSULE | Freq: Every day | ORAL | Status: DC
  Administered 2014-07-15: 12.5 mg via ORAL
  Filled 2014-07-14: qty 1

## 2014-07-14 MED ORDER — ONDANSETRON HCL 4 MG/2ML IJ SOLN
4.0000 mg | INTRAMUSCULAR | Status: DC | PRN
Start: 2014-07-14 — End: 2014-07-15

## 2014-07-14 MED ORDER — OXYCODONE HCL 5 MG PO TABS: 5.0000 mg | ORAL_TABLET | Freq: Once | ORAL | Status: DC | PRN

## 2014-07-14 MED ORDER — METHOCARBAMOL 500 MG PO TABS
ORAL_TABLET | ORAL | Status: AC
  Filled 2014-07-14: qty 1

## 2014-07-14 MED ORDER — ACETAMINOPHEN 160 MG/5ML PO SOLN
325.0000 mg | ORAL | Status: DC | PRN
  Filled 2014-07-14: qty 20.3

## 2014-07-14 MED ORDER — OXYCODONE HCL 5 MG PO TABS
10.0000 mg | ORAL_TABLET | ORAL | Status: DC | PRN
  Administered 2014-07-14 (×2): 10 mg via ORAL
  Filled 2014-07-14 (×2): qty 2

## 2014-07-14 MED ORDER — PHENOL 1.4 % MT LIQD: 1.0000 | OROMUCOSAL | Status: DC | PRN

## 2014-07-14 MED ORDER — ROCURONIUM BROMIDE 100 MG/10ML IV SOLN
INTRAVENOUS | Status: DC | PRN
  Administered 2014-07-14: 50 mg via INTRAVENOUS

## 2014-07-14 MED ORDER — LINAGLIPTIN 5 MG PO TABS
5.0000 mg | ORAL_TABLET | Freq: Every day | ORAL | Status: DC
  Administered 2014-07-15: 5 mg via ORAL
  Filled 2014-07-14 (×2): qty 1

## 2014-07-14 MED ORDER — MIDAZOLAM HCL 5 MG/5ML IJ SOLN
INTRAMUSCULAR | Status: DC | PRN
  Administered 2014-07-14: 2 mg via INTRAVENOUS

## 2014-07-14 SURGICAL SUPPLY — 74 items
ADH SKN CLS APL DERMABOND .7 (GAUZE/BANDAGES/DRESSINGS) ×1
APPLIER CLIP 11 MED OPEN (CLIP)
APR CLP MED 11 20 MLT OPN (CLIP)
BLADE SURG 10 STRL SS (BLADE) ×3 IMPLANT
BLADE SURG ROTATE 9660 (MISCELLANEOUS) IMPLANT
CLIP APPLIE 11 MED OPEN (CLIP) IMPLANT
CLOSURE STERI-STRIP 1/2X4 (GAUZE/BANDAGES/DRESSINGS) ×1
CLOSURE WOUND 1/2 X4 (GAUZE/BANDAGES/DRESSINGS) ×1
CLSR STERI-STRIP ANTIMIC 1/2X4 (GAUZE/BANDAGES/DRESSINGS) ×2 IMPLANT
CORDS BIPOLAR (ELECTRODE) ×3 IMPLANT
COVER SURGICAL LIGHT HANDLE (MISCELLANEOUS) ×3 IMPLANT
DERMABOND ADVANCED (GAUZE/BANDAGES/DRESSINGS) ×2
DERMABOND ADVANCED .7 DNX12 (GAUZE/BANDAGES/DRESSINGS) ×1 IMPLANT
DRAPE C-ARM 42X72 X-RAY (DRAPES) ×3 IMPLANT
DRAPE ORTHO SPLIT 77X108 STRL (DRAPES) ×3
DRAPE POUCH INSTRU U-SHP 10X18 (DRAPES) ×3 IMPLANT
DRAPE SURG ORHT 6 SPLT 77X108 (DRAPES) ×1 IMPLANT
DRAPE U-SHAPE 47X51 STRL (DRAPES) ×6 IMPLANT
DRSG MEPILEX BORDER 4X8 (GAUZE/BANDAGES/DRESSINGS) ×3 IMPLANT
DURAPREP 26ML APPLICATOR (WOUND CARE) ×3 IMPLANT
ELECT BLADE 4.0 EZ CLEAN MEGAD (MISCELLANEOUS) ×3
ELECT CAUTERY BLADE 6.4 (BLADE) ×3 IMPLANT
ELECT PENCIL ROCKER SW 15FT (MISCELLANEOUS) ×3 IMPLANT
ELECT REM PT RETURN 9FT ADLT (ELECTROSURGICAL) ×3
ELECTRODE BLDE 4.0 EZ CLN MEGD (MISCELLANEOUS) ×1 IMPLANT
ELECTRODE REM PT RTRN 9FT ADLT (ELECTROSURGICAL) ×1 IMPLANT
GAUZE SPONGE 4X4 16PLY XRAY LF (GAUZE/BANDAGES/DRESSINGS) ×3 IMPLANT
GLOVE BIOGEL PI IND STRL 8 (GLOVE) ×1 IMPLANT
GLOVE BIOGEL PI IND STRL 8.5 (GLOVE) ×1 IMPLANT
GLOVE BIOGEL PI INDICATOR 8 (GLOVE) ×2
GLOVE BIOGEL PI INDICATOR 8.5 (GLOVE) ×2
GLOVE ECLIPSE 8.5 STRL (GLOVE) ×3 IMPLANT
GLOVE ORTHO TXT STRL SZ7.5 (GLOVE) ×3 IMPLANT
GOWN STRL REUS W/ TWL LRG LVL3 (GOWN DISPOSABLE) ×1 IMPLANT
GOWN STRL REUS W/TWL 2XL LVL3 (GOWN DISPOSABLE) ×6 IMPLANT
GOWN STRL REUS W/TWL LRG LVL3 (GOWN DISPOSABLE) ×3
GUIDEWIRE NITINOL BEVEL TIP (WIRE) ×6 IMPLANT
HARVESTER BONE 10MM SHORT (INSTRUMENTS) ×3 IMPLANT
KIT BASIN OR (CUSTOM PROCEDURE TRAY) ×3 IMPLANT
KIT ROOM TURNOVER OR (KITS) ×3 IMPLANT
NEEDLE 22X1 1/2 (OR ONLY) (NEEDLE) ×3 IMPLANT
NEEDLE I-PASS III (NEEDLE) ×3 IMPLANT
NEEDLE SPNL 18GX3.5 QUINCKE PK (NEEDLE) ×3 IMPLANT
NS IRRIG 1000ML POUR BTL (IV SOLUTION) ×3 IMPLANT
PACK LAMINECTOMY ORTHO (CUSTOM PROCEDURE TRAY) ×3 IMPLANT
PACK UNIVERSAL I (CUSTOM PROCEDURE TRAY) ×3 IMPLANT
PAD ARMBOARD 7.5X6 YLW CONV (MISCELLANEOUS) ×6 IMPLANT
SCREW PRECEPT POLY 7.5X45MM (Screw) ×3 IMPLANT
SCREW PRECEPT POLYAXIAL 7.5X40 (Screw) ×3 IMPLANT
SCREW PRECEPT SET (Screw) ×9 IMPLANT
SHEET CONFORM 45LX20WX5H (Bone Implant) ×3 IMPLANT
SPONGE INTESTINAL PEANUT (DISPOSABLE) ×6 IMPLANT
SPONGE LAP 4X18 X RAY DECT (DISPOSABLE) ×3 IMPLANT
SPONGE SURGIFOAM ABS GEL 100 (HEMOSTASIS) IMPLANT
STRIP CLOSURE SKIN 1/2X4 (GAUZE/BANDAGES/DRESSINGS) ×2 IMPLANT
SURGIFLO TRUKIT (HEMOSTASIS) IMPLANT
SUT BONE WAX W31G (SUTURE) ×3 IMPLANT
SUT MON AB 3-0 SH 27 (SUTURE) ×3
SUT MON AB 3-0 SH27 (SUTURE) ×1 IMPLANT
SUT PROLENE 5 0 C 1 24 (SUTURE) IMPLANT
SUT SILK 2 0 TIES 10X30 (SUTURE) ×3 IMPLANT
SUT SILK 3 0 TIES 10X30 (SUTURE) ×3 IMPLANT
SUT VIC AB 1 CT1 27 (SUTURE) ×6
SUT VIC AB 1 CT1 27XBRD ANBCTR (SUTURE) ×2 IMPLANT
SUT VIC AB 1 CTX 36 (SUTURE) ×6
SUT VIC AB 1 CTX36XBRD ANBCTR (SUTURE) ×2 IMPLANT
SUT VIC AB 2-0 CT1 18 (SUTURE) ×3 IMPLANT
SYR BULB IRRIGATION 50ML (SYRINGE) ×3 IMPLANT
SYR CONTROL 10ML LL (SYRINGE) ×3 IMPLANT
TAPE CLOTH 4X10 WHT NS (GAUZE/BANDAGES/DRESSINGS) ×6 IMPLANT
TOWEL OR 17X24 6PK STRL BLUE (TOWEL DISPOSABLE) ×3 IMPLANT
TOWEL OR 17X26 10 PK STRL BLUE (TOWEL DISPOSABLE) ×3 IMPLANT
TRAY FOLEY CATH 16FR SILVER (SET/KITS/TRAYS/PACK) ×3 IMPLANT
WATER STERILE IRR 1000ML POUR (IV SOLUTION) ×3 IMPLANT

## 2014-07-14 NOTE — Brief Op Note (Signed)
07/14/2014  3:29 PM  PATIENT:  [name]  53 y.o. female  PRE-OPERATIVE DIAGNOSIS:  PSEUDOARTHROSIS OF L5-S1  POST-OPERATIVE DIAGNOSIS:  PSEUDOARTHROSIS OF L5-S1  PROCEDURE:  Procedure(s): REMOVAL OF L5-S1 PEDICLE SCREWS,FUSION L5-S1, REINSERTION OF PEDICLE SCREWS ILIAC CREST BONE GRAFT (N/A)  SURGEON:  Surgeon(s) and Role:    * Melina Schools, MD - Primary  PHYSICIAN ASSISTANT:   ASSISTANTS: none   ANESTHESIA:   general  EBL:  Total I/O In: 1000 [I.V.:1000] Out: -   BLOOD ADMINISTERED:none  DRAINS: none   LOCAL MEDICATIONS USED:  MARCAINE     SPECIMEN:  No Specimen  DISPOSITION OF SPECIMEN:  N/A  COUNTS:  YES  TOURNIQUET:  * No tourniquets in log *  DICTATION: .Other Dictation: Dictation Number [id number]  PLAN OF CARE: Admit to inpatient   PATIENT DISPOSITION:  PACU - hemodynamically stable.

## 2014-07-14 NOTE — H&P (Signed)
History of Present Illness  The patient is a 53 year old female who comes in today for a preoperative History and Physical. The patient is scheduled for a Excision of L5-S1 pedicle screw, fusion at L5-S1 to be performed by Dr. Duane Lope D. Rolena Infante, MD at [facility] on 07/14/14 . Please see the hospital record for complete dictated history and physical.  Additional reasons for visit:  Post op is described as the following: Patient is 14 months postop following PLIF L5-S1 on 05/14/13 (post op PLIF on 05/14/13).Overall the patient feels that they are not doing well. The patient does report pain ("pressure "both hips), numbness (into left arm) and tingling The patient does indicate that these symptoms are worsening. The patient is on the following DVT prophylaxis treatment: Aspirin (stopped 07/08/14) . The patient is not using a brace. The patient is currently not working (patient was terminated 11-04-13). Note for "Post op": The patient has not been in since obtaining the CT scan.  Allergies No Known Drug Allergies09/17/2013 No Known Allergies08/02/2011 (Marked as Inactive)  Family History  Diabetes Mellitus mother and grandmother fathers side Cancer mother Rheumatoid Arthritis mother Osteoarthritis Mother. mother Hypertension First Degree Relatives. mother  Social History  Tobacco use Former smoker. quit smoking 21 years ago former smoker, former smoker; smoke(d) 3/4 pack(s) per day Tobacco / smoke exposure yes outdoors only Living situation live alone Exercise Exercises rarely; does running / walking Drug/Alcohol Rehab (Previously) no Marital status divorced Drug/Alcohol Rehab (Currently) no Illicit drug use no Number of flights of stairs before winded less than 1 Children 5 or more Current work status working full time Alcohol use Occasional alcohol use, Drinks wine. current drinker; drinks wine; only occasionally per week Pain Contract yes  Medication  History  Norco (5-325MG  Tablet, 1 (one) Oral TID PRN, Taken starting 06/14/2014) Active. (ddb/smt 06/14/14 given at visit) Neurontin (300MG  Capsule, 1 Oral three times daily, Taken starting 05/21/2014) Active. Mobic (7.5MG  Tablet, Oral) Active. Potassium (75MG  Tablet, 1 Oral daily) Active. Valium (10MG  Tablet, Oral) Active. (prn) Lisinopril-Hydrochlorothiazide (10-12.5MG  Tablet, Oral) Active. (qd) HumaLOG Mix 75/25 ((75-25) 100UNIT/ML Suspension, Subcutaneous) Active. (tid) AmLODIPine Besylate (2.5MG  Tablet, Oral) Active. (qd)  Vitals  07/09/2014 10:27 AM Weight: 186 lb Height: 65in Body Surface Area: 1.92 m Body Mass Index: 30.95 kg/m  Temp.: 98.34F  Pulse: 71 (Regular)  BP: 134/80 (Sitting, Left Arm, Standard)  Physical Exam  Peripheral Vascular Lower Extremity Palpation - Pulses - Bilateral - pulses intact. Calf - Bilateral - soft/supple to palpation.  Neurologic Examination of related systems reveals -patient is well-developed and well-nourished. Gait-Normal. Motor Strength - 5/5 normal muscle strength - All Muscles. Testing Rhomberg - negative. Hoffman's Sign - No Hoffman's sign present. Seated Straight Leg Raise - Bilateral - Seated straight leg raise negative.  Musculoskeletal Spine/Ribs/Pelvis  Lumbosacral Spine: Assessment of pain reveals the following findings - The pain is characterized as - severe. Scale - 8 /10. Location - pain refers bilaterally to hips. Location - lumbar area.  Her CT scan from 01/05/2014 was reviewed. She has a pseudoarthrosis of the L5-S1 TLIF with some loosening and lytic changes around the L5-S1 screw.  Current Plans Posterior decompression/Fusion:Risks of surgery include infection, bleeding, nerve damage, death, stroke, paralysis, failure to heal, need for further surgery, ongoing or worse pain, need for further surgery, CSF leak, loss of bowel or bladder, and recurrent disc herniation or stenosis which would  necessitate need for further surgery. Non-union, hardware failure, adjacent segment disease and recurrent pain. Hardware breakage,  mal-position requiring surgery to correct or remove. Goal Of Surgery:Discussed that goal of surgery is to reduce pain and improve function and quality of life. Patient is aware that despite all appropriate treatment that there pain and function could be the same, worse, or different.  Assessments Transcription At this point, as I have indicated in the past, I would plan on removing the pedicle screw from the right side at L5 and S1 and then if possible, repositioning and placing new screws. I would then supplement this with posterior lateral autograft using iliac crest.  Plans Transcription The risks were explained including infection, bleeding, nerve damage, ongoing or worse pain, inability to place the screws, the need for further surgery and also death, stroke and paralysis. At this point in time, she has expressed the desire to proceed as soon as possible. Once we have approval from her insurance company, we will try and do this in an expedited fashion.

## 2014-07-14 NOTE — Anesthesia Procedure Notes (Signed)
Procedure Name: Intubation Date/Time: 07/14/2014 1:25 PM Performed by: Jenne Campus Pre-anesthesia Checklist: Patient identified, Emergency Drugs available, Suction available, Patient being monitored and Timeout performed Patient Re-evaluated:Patient Re-evaluated prior to inductionOxygen Delivery Method: Circle system utilized Preoxygenation: Pre-oxygenation with 100% oxygen Intubation Type: IV induction Ventilation: Mask ventilation without difficulty and Oral airway inserted - appropriate to patient size Laryngoscope Size: Miller and 2 Grade View: Grade I Tube type: Oral Tube size: 7.0 mm Number of attempts: 1 Airway Equipment and Method: Stylet Placement Confirmation: ETT inserted through vocal cords under direct vision,  positive ETCO2,  CO2 detector and breath sounds checked- equal and bilateral Secured at: 21 cm Tube secured with: Tape Dental Injury: Teeth and Oropharynx as per pre-operative assessment

## 2014-07-14 NOTE — Anesthesia Preprocedure Evaluation (Signed)
Anesthesia Evaluation  Patient identified by MRN, date of birth, ID band Patient awake    Reviewed: Allergy & Precautions, H&P , NPO status , Patient's Chart, lab work & pertinent test results  History of Anesthesia Complications Negative for: history of anesthetic complications  Airway Mallampati: II  TM Distance: >3 FB Neck ROM: Full    Dental  (+) Teeth Intact   Pulmonary neg shortness of breath, neg COPDneg recent URI, former smoker,  breath sounds clear to auscultation        Cardiovascular hypertension, Pt. on medications - angina- Past MI and - CHF Rhythm:Regular     Neuro/Psych  Headaches, PSYCHIATRIC DISORDERS Anxiety Depression  Neuromuscular disease    GI/Hepatic Neg liver ROS, GERD-  Controlled,  Endo/Other  diabetes, Type 2, Insulin Dependent, Oral Hypoglycemic Agents  Renal/GU negative Renal ROS     Musculoskeletal  (+) Arthritis -,   Abdominal   Peds  Hematology  (+) anemia ,   Anesthesia Other Findings   Reproductive/Obstetrics                             Anesthesia Physical Anesthesia Plan  ASA: III  Anesthesia Plan: General   Post-op Pain Management:    Induction: Intravenous  Airway Management Planned: Oral ETT  Additional Equipment: None  Intra-op Plan:   Post-operative Plan: Extubation in OR  Informed Consent: I have reviewed the patients History and Physical, chart, labs and discussed the procedure including the risks, benefits and alternatives for the proposed anesthesia with the patient or authorized representative who has indicated his/her understanding and acceptance.   Dental advisory given  Plan Discussed with: CRNA and Surgeon  Anesthesia Plan Comments:         Anesthesia Quick Evaluation

## 2014-07-14 NOTE — Progress Notes (Signed)
Area of incision same as placement for sacral foam dressing, not applied.

## 2014-07-14 NOTE — Transfer of Care (Signed)
Immediate Anesthesia Transfer of Care Note  Patient: [name]  Procedure(s) Performed: Procedure(s): REMOVAL OF L5-S1 PEDICLE SCREWS,FUSION L5-S1, REINSERTION OF PEDICLE SCREWS ILIAC CREST BONE GRAFT (N/A)  Patient Location: PACU  Anesthesia Type:General  Level of Consciousness: awake, oriented and patient cooperative  Airway & Oxygen Therapy: Patient Spontanous Breathing and Patient connected to nasal cannula oxygen  Post-op Assessment: Report given to PACU RN and Post -op Vital signs reviewed and stable  Post vital signs: Reviewed  Complications: No apparent anesthesia complications

## 2014-07-15 ENCOUNTER — Inpatient Hospital Stay (HOSPITAL_COMMUNITY): Payer: BC Managed Care – PPO

## 2014-07-15 ENCOUNTER — Telehealth: Payer: Self-pay

## 2014-07-15 ENCOUNTER — Encounter (HOSPITAL_COMMUNITY): Payer: Self-pay | Admitting: Orthopedic Surgery

## 2014-07-15 LAB — GLUCOSE, CAPILLARY
GLUCOSE-CAPILLARY: 187 mg/dL — AB (ref 70–99)
Glucose-Capillary: 281 mg/dL — ABNORMAL HIGH (ref 70–99)
Glucose-Capillary: 256 mg/dL — ABNORMAL HIGH (ref 70–99)

## 2014-07-15 LAB — HEMOGLOBIN A1C
Hgb A1c MFr Bld: 10.2 % — ABNORMAL HIGH (ref <5.7)
Mean Plasma Glucose: 246 mg/dL — ABNORMAL HIGH (ref <117)

## 2014-07-15 MED ORDER — HYDROMORPHONE HCL 1 MG/ML IJ SOLN
0.5000 mg | INTRAMUSCULAR | Status: DC | PRN
  Administered 2014-07-15: 0.5 mg via INTRAVENOUS
  Filled 2014-07-15: qty 1

## 2014-07-15 MED ORDER — INFLUENZA VAC SPLIT QUAD 0.5 ML IM SUSY
0.5000 mL | PREFILLED_SYRINGE | Freq: Once | INTRAMUSCULAR | Status: AC
  Administered 2014-07-15: 0.5 mL via INTRAMUSCULAR
  Filled 2014-07-15: qty 0.5

## 2014-07-15 MED ORDER — ONDANSETRON HCL 4 MG PO TABS: 4.0000 mg | ORAL_TABLET | Freq: Three times a day (TID) | ORAL | Status: DC | PRN

## 2014-07-15 MED ORDER — OXYCODONE-ACETAMINOPHEN 10-325 MG PO TABS: 1.0000 | ORAL_TABLET | ORAL | Status: DC | PRN

## 2014-07-15 MED ORDER — DIAZEPAM 5 MG PO TABS
10.0000 mg | ORAL_TABLET | Freq: Two times a day (BID) | ORAL | Status: DC | PRN
  Administered 2014-07-15: 10 mg via ORAL
  Filled 2014-07-15: qty 2

## 2014-07-15 MED ORDER — OXYCODONE HCL 5 MG PO TABS
5.0000 mg | ORAL_TABLET | ORAL | Status: DC | PRN
  Administered 2014-07-15 (×2): 15 mg via ORAL
  Filled 2014-07-15 (×2): qty 3

## 2014-07-15 MED ORDER — METHOCARBAMOL 500 MG PO TABS: 500.0000 mg | ORAL_TABLET | Freq: Three times a day (TID) | ORAL | Status: DC | PRN

## 2014-07-15 MED ORDER — TRAZODONE HCL 100 MG PO TABS
100.0000 mg | ORAL_TABLET | Freq: Every evening | ORAL | Status: DC | PRN
  Filled 2014-07-15 (×2): qty 1

## 2014-07-15 MED ORDER — FLUCONAZOLE 150 MG PO TABS: 150.0000 mg | ORAL_TABLET | Freq: Once | ORAL | Status: DC

## 2014-07-15 MED ORDER — ACETAMINOPHEN 500 MG PO TABS
1000.0000 mg | ORAL_TABLET | Freq: Once | ORAL | Status: AC
  Administered 2014-07-15: 1000 mg via ORAL
  Filled 2014-07-15: qty 2

## 2014-07-15 NOTE — Progress Notes (Signed)
    Subjective: Procedure(s) (LRB): REMOVAL OF L5-S1 PEDICLE SCREWS,FUSION L5-S1, REINSERTION OF PEDICLE SCREWS ILIAC CREST BONE GRAFT (N/A) 1 Day Post-Op  Patient reports pain as 2 on 0-10 scale.  Reports decreased leg pain reports incisional back pain   Positive void Negative bowel movement Positive flatus Negative chest pain or shortness of breath  Objective: Vital signs in last 24 hours: Temp:  [97.5 F (36.4 C)-98.4 F (36.9 C)] 98 F (36.7 C) (12/24 0401) Pulse Rate:  [51-68] 51 (12/24 0401) Resp:  [0-21] 20 (12/24 0401) BP: (108-140)/(59-79) 108/59 mmHg (12/24 0401) SpO2:  [99 %-100 %] 100 % (12/24 0401) Weight:  [88.27 kg (194 lb 9.6 oz)] 88.27 kg (194 lb 9.6 oz) (12/23 0952)  Intake/Output from previous day: 12/23 0701 - 12/24 0700 In: 1595.3 [I.V.:1595.3] Out: -   Labs:  Recent Labs  07/12/14 1541  WBC 7.0  RBC 3.97  HCT 36.8  PLT 394    Recent Labs  07/12/14 1541  NA 142  K 4.3  CL 104  CO2 28  BUN 13  CREATININE 0.57  GLUCOSE 137*  CALCIUM 9.3   No results for input(s): LABPT, INR in the last 72 hours.  Physical Exam: Neurologically intact ABD soft Intact pulses distally Incision: dressing C/D/I and no drainage Compartment soft  Assessment/Plan: Patient stable  xrays satisfactory Continue mobilization with physical therapy Continue care  Up with therapy  Doing well If cleared by PT ok for d/c to home today  Melina Schools, MD [place] [phone]

## 2014-07-15 NOTE — Anesthesia Postprocedure Evaluation (Signed)
  Anesthesia Post-op Note  Patient: [name]  Procedure(s) Performed: Procedure(s): REMOVAL OF L5-S1 PEDICLE SCREWS,FUSION L5-S1, REINSERTION OF PEDICLE SCREWS ILIAC CREST BONE GRAFT (N/A)  Patient Location: PACU  Anesthesia Type:General  Level of Consciousness: awake  Airway and Oxygen Therapy: Patient Spontanous Breathing  Post-op Pain: moderate  Post-op Assessment: Post-op Vital signs reviewed, Patient's Cardiovascular Status Stable, Respiratory Function Stable, Patent Airway and Pain level controlled  Post-op Vital Signs: Reviewed and stable  Last Vitals:  Filed Vitals:   07/15/14 0959  BP: 111/62  Pulse:   Temp:   Resp:     Complications: No apparent anesthesia complications

## 2014-07-15 NOTE — Telephone Encounter (Signed)
Patient called stating was recently put on antibiotics for sinusitis and has  Now developed a yeast infection from the medication and requesting something be prescribed for that Prescription for diflucan sent to pharmacy on file

## 2014-07-15 NOTE — Op Note (Signed)
NAMEKOREA, SEVERS          ACCOUNT NO.:  [id number]  MEDICAL RECORD NO.:  [id number]  LOCATION:  5N02C                        FACILITY:  [facility]  PHYSICIAN:  Dahlia Bailiff, MD    DATE OF BIRTH:  [birth year]  DATE OF PROCEDURE:  07/14/2014 DATE OF DISCHARGE:                              OPERATIVE REPORT   PREOPERATIVE DIAGNOSIS:  Pseudoarthrosis, L5-S1.  POSTOPERATIVE DIAGNOSIS:  Pseudoarthrosis, L5-S1.  OPERATIVE PROCEDURES: 1. Removal of pedicle screw fixation, L5-S1 with new implantation of     larger pedicle screws with new trajectory. 2. Iliac crest bone graft harvest. 3. Posterolateral fusion using autograft and Conform allograft sheet.  COMPLICATIONS:  None.  CONDITION:  Stable.  HISTORY:  This is a very pleasant woman, who underwent a posterior lumbar interbody fusion and subsequently went on to have a pseudoarthrosis.  Because of ongoing back pain, we elected to treat this surgically.  Attempts at conservative management had failed. Unfortunately, she was not having any significant radicular (neuropathic) leg pain to warrant revision and decompression, but just to address the back pain with a pseudoarthrosis.  After discussing all risks, benefits, and alternatives, she consented to surgery.  OPERATIVE NOTE:  The patient was brought to the operating room, placed supine on the operating table.  After successful induction of general anesthesia and endotracheal intubation, TEDs, SCDs were applied.  She was turned prone onto the spine frame.  All bony prominences were well padded and the back was prepped and draped in the standard fashion. Once this was completed, a time-out was taken to confirm the patient, procedure, and all other pertinent important data.  The patient had a right-sided Wiltse approach from her previous surgery.  This incision site was infiltrated with 0.25% Marcaine with epi and reincised.  Sharp dissection was carried out down to the deep  fascia.  Deep fascia was sharply incised and I dissected down until I was able to bluntly until I palpated the pedicle screw construct.  Once this was done, I then dissected underneath the fascia to expose the posterior iliac crest. Once I had this done, I then removed the locking nuts, the rod and then removed the screws.  The L5 screw was grossly loose consistent with what was seen on her preoperative MRI and CT scan.  A Jamshidi needle was then advanced in a slightly new trajectory and got excellent purchase. I then tapped with a 6.5 tap and placed a 7.5, 45 mm screw.  This screw had excellent purchase and had a new trajectory compared to the previous one.  I then removed the S1 screw tap with a 6.5 screw and then repalpated with a ball-tipped Feeler.  I then placed a 7.5, 40 mm length pedicle screw at this length.  I then decorticated the ala and the L5 transverse process.  I then exposed the posterior iliac crest, broached the cortex and then using curettes, I harvested an ample amount of local autograft bone.  I then irrigated the iliac crest site and packed it with thrombin-soaked Gelfoam patty.  I then packed the bone graft in the posterolateral corner and then placed a thrombin-soaked Gelfoam and a Conform sheet over the entire bone graft.  I then secured  the rod into place, torquing the locking caps according to manufacture's standards. I had an excellent fixation.  Final x-rays were satisfactory.  I irrigated the wound copiously with normal saline, made sure I had hemostasis using bipolar electrocautery and then closed the deep fascia with interrupted #1 Vicryl sutures, superficial with 2-0 Vicryl sutures, and a 3-0 Monocryl for the skin.  Steri-Strips and a dry dressing were applied.  The patient was ultimately extubated and transferred to the PACU without incident.  At the end of the case, all needle and sponge counts were correct.  There was no adverse intraoperative  events.     Dahlia Bailiff, MD     DDB/MEDQ  D:  07/14/2014  T:  07/15/2014  Job:  [id number]

## 2014-07-15 NOTE — Progress Notes (Signed)
OT Cancellation Note  Patient Details Name: [name] MRN: [id number] DOB: 1961-02-02   Cancelled Treatment:    Reason Eval/Treat Not Completed: Patient at procedure or test/ unavailable. Pt off floor for x-rays and OT will follow up to evaluated as available.   Villa Herb M   Cyndie Chime, OTR/L Occupational Therapist [phone] (pager)  07/15/2014, 9:29 AM

## 2014-07-15 NOTE — Discharge Summary (Signed)
Patient ID: [name] MRN: [id number] DOB/AGE: 08/19/1960 53 y.o.  Admit date: 07/14/2014 Discharge date: 07/15/2014  Admission Diagnoses:  Active Problems:   Lumbar pseudoarthrosis   Discharge Diagnoses:  Active Problems:   Lumbar pseudoarthrosis  status post Procedure(s): REMOVAL OF L5-S1 PEDICLE SCREWS,FUSION L5-S1, REINSERTION OF PEDICLE SCREWS ILIAC CREST BONE GRAFT  Past Medical History  Diagnosis Date  . Diabetes mellitus     h/o dka  . Depression   . Constipation   . Fibromyalgia   . Anemia   . Anxiety   . Heart murmur     child  . Hypertension   . GERD (gastroesophageal reflux disease)     tums occ  . Arthritis     Surgeries: Procedure(s): REMOVAL OF L5-S1 PEDICLE SCREWS,FUSION L5-S1, REINSERTION OF PEDICLE SCREWS ILIAC CREST BONE GRAFT on 07/14/2014   Consultants:    Discharged Condition: Improved  Hospital Course: [name] is an 53 y.o. female who was admitted 07/14/2014 for operative treatment of pseudoarthrosis. Patient failed conservative treatments (please see the history and physical for the specifics) and had severe unremitting pain that affects sleep, daily activities and work/hobbies. After pre-op clearance, the patient was taken to the operating room on 07/14/2014 and underwent  Procedure(s): REMOVAL OF L5-S1 PEDICLE SCREWS,FUSION L5-S1, REINSERTION OF PEDICLE SCREWS ILIAC CREST BONE GRAFT.    Patient was given perioperative antibiotics: Anti-infectives    Start     Dose/Rate Route Frequency Ordered Stop   07/14/14 2200  ceFAZolin (ANCEF) IVPB 1 g/50 mL premix     1 g100 mL/hr over 30 Minutes Intravenous Every 8 hours 07/14/14 1713 07/15/14 0613   07/13/14 1347  ceFAZolin (ANCEF) IVPB 2 g/50 mL premix     2 g100 mL/hr over 30 Minutes Intravenous 30 min pre-op 07/13/14 1347 07/14/14 1326       Patient was given sequential compression devices and early ambulation to prevent DVT.   Patient benefited maximally from  hospital stay and there were no complications. At the time of discharge, the patient was urinating/moving their bowels without difficulty, tolerating a regular diet, pain is controlled with oral pain medications and they have been cleared by PT/OT.   Recent vital signs: Patient Vitals for the past 24 hrs:  BP Temp Temp src Pulse Resp SpO2 Height Weight  07/15/14 0401 (!) 108/59 mmHg 98 F (36.7 C) - (!) 51 20 100 % - -  07/14/14 1738 140/73 mmHg 97.5 F (36.4 C) - (!) 59 20 100 % - -  07/14/14 1640 115/73 mmHg 97.8 F (36.6 C) - - 20 100 % - -  07/14/14 1630 117/72 mmHg - - (!) 52 (!) 21 100 % - -  07/14/14 1615 - - - (!) 53 (!) 0 100 % - -  07/14/14 1610 127/61 mmHg - - - 10 - - -  07/14/14 1600 118/63 mmHg - - (!) 57 (!) 7 99 % - -  07/14/14 1545 - - - (!) 51 (!) 4 100 % - -  07/14/14 1540 115/79 mmHg 98.4 F (36.9 C) - - (!) 8 - - -  07/14/14 0952 131/73 mmHg 98.2 F (36.8 C) Oral 68 18 100 % 5\' 5"  (1.651 m) 88.27 kg (194 lb 9.6 oz)     Recent laboratory studies:  Recent Labs  07/12/14 1541  WBC 7.0  HGB 11.7*  HCT 36.8  PLT 394  NA 142  K 4.3  CL 104  CO2 28  BUN 13  CREATININE 0.57  GLUCOSE 137*  CALCIUM 9.3     Discharge Medications:     Medication List    STOP taking these medications        amoxicillin-clavulanate 875-125 MG per tablet  Commonly known as:  AUGMENTIN     diazepam 10 MG tablet  Commonly known as:  VALIUM     fluconazole 150 MG tablet  Commonly known as:  DIFLUCAN     HYDROcodone-acetaminophen 5-325 MG per tablet  Commonly known as:  NORCO/VICODIN     ibuprofen 600 MG tablet  Commonly known as:  ADVIL,MOTRIN     insulin aspart protamine- aspart (70-30) 100 UNIT/ML injection  Commonly known as:  NOVOLOG MIX 70/30     meloxicam 15 MG tablet  Commonly known as:  MOBIC     omeprazole 40 MG capsule  Commonly known as:  PRILOSEC     potassium chloride 10 MEQ tablet  Commonly known as:  K-DUR,KLOR-CON      TAKE these medications         amLODipine 2.5 MG tablet  Commonly known as:  NORVASC  Take 1 tablet (2.5 mg total) by mouth daily.     aspirin EC 81 MG tablet  Take 81 mg by mouth daily.     atorvastatin 40 MG tablet  Commonly known as:  LIPITOR  Take 40 mg by mouth at bedtime.     docusate sodium 100 MG capsule  Commonly known as:  COLACE  Take 1 capsule (100 mg total) by mouth 3 (three) times daily as needed for constipation.     doxycycline 50 MG capsule  Commonly known as:  VIBRAMYCIN  Take 2 capsules (100 mg total) by mouth 2 (two) times daily.     fexofenadine 180 MG tablet  Commonly known as:  ALLEGRA  Take 180 mg by mouth daily.     fluticasone 50 MCG/ACT nasal spray  Commonly known as:  FLONASE  Place 1 spray into both nostrils daily.     gabapentin 300 MG capsule  Commonly known as:  NEURONTIN  Take 1 capsule (300 mg total) by mouth 3 (three) times daily.     losartan-hydrochlorothiazide 100-12.5 MG per tablet  Commonly known as:  HYZAAR  Take 1 tablet by mouth daily.     methocarbamol 500 MG tablet  Commonly known as:  ROBAXIN  Take 1 tablet (500 mg total) by mouth 3 (three) times daily as needed for muscle spasms.     metoCLOPramide 10 MG tablet  Commonly known as:  REGLAN  Take 1 tablet (10 mg total) by mouth every 8 (eight) hours as needed (headache with nausea).     ondansetron 4 MG tablet  Commonly known as:  ZOFRAN  Take 1 tablet (4 mg total) by mouth every 8 (eight) hours as needed for nausea or vomiting.     oxyCODONE-acetaminophen 10-325 MG per tablet  Commonly known as:  PERCOCET  Take 1 tablet by mouth every 4 (four) hours as needed for pain.     sitaGLIPtin-metformin 50-1000 MG per tablet  Commonly known as:  JANUMET  Take 1 tablet by mouth 2 (two) times daily with a meal.     traZODone 100 MG tablet  Commonly known as:  DESYREL  Take 100 mg by mouth at bedtime as needed for sleep.        Diagnostic Studies: Dg Lumbar Spine 2-3 Views  07/15/2014    CLINICAL DATA:  L5-S1 PLIF.  EXAM: LUMBAR SPINE -  2-3 VIEW  COMPARISON:  07/14/2014.  FINDINGS: The alignment of the lumbar spine is normal. Pedicle screws and rods are identified at L5 and S1. Interbody cage is identified within the L5-S1 disc space. No complications noted.  IMPRESSION: 1. L5-S1 posterior and interbody fusion. No acute abnormality noted.   Electronically Signed   By: Kerby Moors M.D.   On: 07/15/2014 09:38   Dg Lumbar Spine 2-3 Views  07/14/2014   CLINICAL DATA:  Lumbar spine fusion.  EXAM: DG C-ARM 61-120 MIN; LUMBAR SPINE - 2-3 VIEW  FLUOROSCOPY TIME:  0 min 45 seconds.  COMPARISON:  05/14/2013.  FINDINGS: Prior L5-S1 posterior and interbody fusion with good anatomic alignment. No acute bony abnormality identified.  IMPRESSION: L5-S1 posterior and interbody fusion. No acute abnormality identified .   Electronically Signed   By: Marcello Moores  Register   On: 07/14/2014 16:13   Dg C-arm 61-120 Min  07/14/2014   CLINICAL DATA:  Lumbar spine fusion.  EXAM: DG C-ARM 61-120 MIN; LUMBAR SPINE - 2-3 VIEW  FLUOROSCOPY TIME:  0 min 45 seconds.  COMPARISON:  05/14/2013.  FINDINGS: Prior L5-S1 posterior and interbody fusion with good anatomic alignment. No acute bony abnormality identified.  IMPRESSION: L5-S1 posterior and interbody fusion. No acute abnormality identified .   Electronically Signed   By: [place]   On: 07/14/2014 16:13          Follow-up Information    Follow up with Dahlia Bailiff, MD In 2 weeks.   Specialty:  Orthopedic Surgery   Why:  For wound re-check   Contact information:   [address] [place] [address] [phone]       Discharge Plan:  discharge to home  Disposition: doing well.  No radicular leg pain, xrays satisfactory.    Signed: Melina Schools D for Dr. Melina Schools [facility] [phone] 07/15/2014, 9:46 AM

## 2014-07-15 NOTE — Clinical Social Work Note (Signed)
Clinical Social Worker received referral for possible ST-SNF placement.  Chart reviewed.  PT/OT pending, however patient with discharge order for home today.  Spoke with RN Case Manager who will follow up with patient to discuss home health needs if deemed appropriate.    CSW signing off - please re consult if social work needs arise.  Barbette Or, [place]

## 2014-07-15 NOTE — Progress Notes (Signed)
OT Cancellation Note  Patient Details Name: [name] MRN: [id number] DOB: [birth year]   Cancelled Treatment:    Reason Eval/Treat Not Completed: OT screened, no needs identified, will sign off. Pt with hx of back surgery and recalled 3/3 back precautions independently. Mobilized well with PT and pt has no ADL concerns. Acute OT to sign off.   Villa Herb M   Cyndie Chime, OTR/L Occupational Therapist [phone] (pager)  07/15/2014, 11:00 AM

## 2014-07-15 NOTE — Progress Notes (Signed)
CARE MANAGEMENT NOTE 07/15/2014  Patient:  [name], [name]   Account Number:  [id number]  Date Initiated:  07/15/2014  Documentation initiated by:  [facility]  Subjective/Objective Assessment:   s/p removal of screws L5- S1, iliac crest graft, fusion     Action/Plan:   PT eval- no follow up recommended, patient has a rolling walker at home   Anticipated DC Date:  07/15/2014   Anticipated DC Plan:  [place]

## 2014-07-15 NOTE — Progress Notes (Signed)
Utilization review completed.  

## 2014-07-15 NOTE — Evaluation (Signed)
Physical Therapy Evaluation Patient Details Name: [name] MRN: [id number] DOB: 05-18-1961 Today's Date: 07/15/2014   History of Present Illness  Pt is s/p REMOVAL OF L5-S1 PEDICLE SCREWS,FUSION L5-S1, REINSERTION OF PEDICLE SCREWS ILIAC CREST BONE GRAFT.  Clinical Impression  Patient is s/p above surgery.  Presents to PT with good recall of teaching from previous back surgery. No further PT needs at this time. Pt will have 24 hour care initially upon DC home and has RW from last surgery.      Follow Up Recommendations No PT follow up;Supervision for mobility/OOB (Supervision just upon initial DC)    Equipment Recommendations  None recommended by PT (Pt has RW at home)    Recommendations for Other Services       Precautions / Restrictions Precautions Precautions: Back Precaution Booklet Issued: Yes (comment) Required Braces or Orthoses: Spinal Brace (Pt states Dr Rolena Infante stated wear brace from previous surgery) Spinal Brace: Lumbar corset Restrictions Weight Bearing Restrictions: Yes (no bending arching twisting)      Mobility  Bed Mobility Overal bed mobility: Modified Independent             General bed mobility comments: able to log roll without difficulty  Transfers Overall transfer level: Needs assistance Equipment used: None Transfers: Sit to/from Stand Sit to Stand: Supervision         General transfer comment: Demonstrated safe technique  Ambulation/Gait Ambulation/Gait assistance: Supervision Ambulation Distance (Feet): 80 Feet Assistive device: Rolling walker (2 wheeled) Gait Pattern/deviations: Step-through pattern;Decreased stride length Gait velocity: decreased Gait velocity interpretation: Below normal speed for age/gender General Gait Details: no balance losses. Safe technique  Stairs Stairs: Yes Stairs assistance: Min assist Stair Management: Forwards;Step to pattern (Held to PT's arms) Number of Stairs: 3 General stair  comments: performed without difficult.  Able to verbalize technique  Wheelchair Mobility    Modified Rankin (Stroke Patients Only)       Balance                                             Pertinent Vitals/Pain Pain Assessment: 0-10 Pain Score: 5  Pain Location: low back Pain Intervention(s): Limited activity within patient's tolerance;Monitored during session;Premedicated before session    Home Living Family/patient expects to be discharged to:: Private residence Living Arrangements: Children Available Help at Discharge: Family;Available 24 hours/day Type of Home: House Home Access: Stairs to enter Entrance Stairs-Rails: None Entrance Stairs-Number of Steps: 2 Home Layout: One level Home Equipment: Walker - 2 wheels;Cane - single point      Prior Function Level of Independence: Independent with assistive device(s) (Used cane occassionally due to back pain)               Hand Dominance   Dominant Hand: Right    Extremity/Trunk Assessment   Upper Extremity Assessment: Overall WFL for tasks assessed           Lower Extremity Assessment: Overall WFL for tasks assessed (NT greater than 3/5)      Cervical / Trunk Assessment: Normal  Communication   Communication: No difficulties  Cognition Arousal/Alertness: Awake/alert Behavior During Therapy: WFL for tasks assessed/performed Overall Cognitive Status: Within Functional Limits for tasks assessed                      General Comments General comments (skin integrity, edema, etc.): Pt with  good recall of safe mobility from previous surgery.  Will have help from family at DC. Pt feels safe to DC home today. Answered all questions realted to mobility and discussed gradual increase of walking distance once home.    Exercises        Assessment/Plan    PT Assessment Patent does not need any further PT services  PT Diagnosis Difficulty walking   PT Problem List    PT  Treatment Interventions     PT Goals (Current goals can be found in the Care Plan section) Acute Rehab PT Goals Patient Stated Goal: to go home today    Frequency     Barriers to discharge        Co-evaluation               End of Session Equipment Utilized During Treatment: Gait belt (Pts daughter to bring in back brace later today. ) Activity Tolerance: Patient tolerated treatment well Patient left: in chair;with call bell/phone within reach Nurse Communication: Mobility status         Time: 0355-9741 PT Time Calculation (min) (ACUTE ONLY): 24 min   Charges:   PT Evaluation $Initial PT Evaluation Tier I: 1 Procedure PT Treatments $Gait Training: 8-22 mins   PT G Codes:          Melvern Banker 07/15/2014, 10:45 AM  Lavonia Dana, PT  [phone] 07/15/2014

## 2014-07-15 NOTE — Progress Notes (Signed)
Patient d/c to home, IV removed, prescriptions given, instructions reviewed. 

## 2014-07-20 ENCOUNTER — Other Ambulatory Visit: Payer: BC Managed Care – PPO

## 2014-07-20 ENCOUNTER — Ambulatory Visit: Payer: BC Managed Care – PPO | Attending: Internal Medicine

## 2014-07-20 ENCOUNTER — Ambulatory Visit: Payer: BC Managed Care – PPO

## 2014-07-20 DIAGNOSIS — I10 Essential (primary) hypertension: Secondary | ICD-10-CM

## 2014-07-20 DIAGNOSIS — E139 Other specified diabetes mellitus without complications: Secondary | ICD-10-CM

## 2014-07-20 LAB — COMPLETE METABOLIC PANEL WITH GFR
ALT: 10 U/L (ref 0–35)
AST: 13 U/L (ref 0–37)
Albumin: 3.6 g/dL (ref 3.5–5.2)
Alkaline Phosphatase: 91 U/L (ref 39–117)
BUN: 10 mg/dL (ref 6–23)
CHLORIDE: 99 meq/L (ref 96–112)
CO2: 28 meq/L (ref 19–32)
Calcium: 9.3 mg/dL (ref 8.4–10.5)
Creat: 0.86 mg/dL (ref 0.50–1.10)
GFR, Est African American: 89 mL/min
GFR, Est Non African American: 77 mL/min
Glucose, Bld: 325 mg/dL — ABNORMAL HIGH (ref 70–99)
Potassium: 4.6 mEq/L (ref 3.5–5.3)
Sodium: 137 mEq/L (ref 135–145)
Total Bilirubin: 0.3 mg/dL (ref 0.2–1.2)
Total Protein: 5.8 g/dL — ABNORMAL LOW (ref 6.0–8.3)

## 2014-07-20 LAB — LIPID PANEL
Cholesterol: 231 mg/dL — ABNORMAL HIGH (ref 0–200)
HDL: 50 mg/dL (ref >39)
LDL Cholesterol: 154 mg/dL — ABNORMAL HIGH (ref 0–99)
Total CHOL/HDL Ratio: 4.6 Ratio
Triglycerides: 136 mg/dL (ref <150)
VLDL: 27 mg/dL (ref 0–40)

## 2014-07-20 MED ORDER — MELOXICAM 7.5 MG PO TABS: 7.5000 mg | ORAL_TABLET | Freq: Every day | ORAL | Status: DC

## 2014-07-20 NOTE — Patient Instructions (Signed)
DASH Eating Plan °DASH stands for "Dietary Approaches to Stop Hypertension." The DASH eating plan is a healthy eating plan that has been shown to reduce high blood pressure (hypertension). Additional health benefits may include reducing the risk of type 2 diabetes mellitus, heart disease, and stroke. The DASH eating plan may also help with weight loss. °WHAT DO I NEED TO KNOW ABOUT THE DASH EATING PLAN? °For the DASH eating plan, you will follow these general guidelines: °· Choose foods with a percent daily value for sodium of less than 5% (as listed on the food label). °· Use salt-free seasonings or herbs instead of table salt or sea salt. °· Check with your health care provider or pharmacist before using salt substitutes. °· Eat lower-sodium products, often labeled as "lower sodium" or "no salt added." °· Eat fresh foods. °· Eat more vegetables, fruits, and low-fat dairy products. °· Choose whole grains. Look for the word "whole" as the first word in the ingredient list. °· Choose fish and skinless chicken or turkey more often than red meat. Limit fish, poultry, and meat to 6 oz (170 g) each day. °· Limit sweets, desserts, sugars, and sugary drinks. °· Choose heart-healthy fats. °· Limit cheese to 1 oz (28 g) per day. °· Eat more home-cooked food and less restaurant, buffet, and fast food. °· Limit fried foods. °· Cook foods using methods other than frying. °· Limit canned vegetables. If you do use them, rinse them well to decrease the sodium. °· When eating at a restaurant, ask that your food be prepared with less salt, or no salt if possible. °WHAT FOODS CAN I EAT? °Seek help from a dietitian for individual calorie needs. °Grains °Whole grain or whole wheat bread. Brown rice. Whole grain or whole wheat pasta. Quinoa, bulgur, and whole grain cereals. Low-sodium cereals. Corn or whole wheat flour tortillas. Whole grain cornbread. Whole grain crackers. Low-sodium crackers. °Vegetables °Fresh or frozen vegetables  (raw, steamed, roasted, or grilled). Low-sodium or reduced-sodium tomato and vegetable juices. Low-sodium or reduced-sodium tomato sauce and paste. Low-sodium or reduced-sodium canned vegetables.  °Fruits °All fresh, canned (in natural juice), or frozen fruits. °Meat and Other Protein Products °Ground beef (85% or leaner), grass-fed beef, or beef trimmed of fat. Skinless chicken or turkey. Ground chicken or turkey. Pork trimmed of fat. All fish and seafood. Eggs. Dried beans, peas, or lentils. Unsalted nuts and seeds. Unsalted canned beans. °Dairy °Low-fat dairy products, such as skim or 1% milk, 2% or reduced-fat cheeses, low-fat ricotta or cottage cheese, or plain low-fat yogurt. Low-sodium or reduced-sodium cheeses. °Fats and Oils °Tub margarines without trans fats. Light or reduced-fat mayonnaise and salad dressings (reduced sodium). Avocado. Safflower, olive, or canola oils. Natural peanut or almond butter. °Other °Unsalted popcorn and pretzels. °The items listed above may not be a complete list of recommended foods or beverages. Contact your dietitian for more options. °WHAT FOODS ARE NOT RECOMMENDED? °Grains °White bread. White pasta. White rice. Refined cornbread. Bagels and croissants. Crackers that contain trans fat. °Vegetables °Creamed or fried vegetables. Vegetables in a cheese sauce. Regular canned vegetables. Regular canned tomato sauce and paste. Regular tomato and vegetable juices. °Fruits °Dried fruits. Canned fruit in light or heavy syrup. Fruit juice. °Meat and Other Protein Products °Fatty cuts of meat. Ribs, chicken wings, bacon, sausage, bologna, salami, chitterlings, fatback, hot dogs, bratwurst, and packaged luncheon meats. Salted nuts and seeds. Canned beans with salt. °Dairy °Whole or 2% milk, cream, half-and-half, and cream cheese. Whole-fat or sweetened yogurt. Full-fat   cheeses or blue cheese. Nondairy creamers and whipped toppings. Processed cheese, cheese spreads, or cheese  curds. °Condiments °Onion and garlic salt, seasoned salt, table salt, and sea salt. Canned and packaged gravies. Worcestershire sauce. Tartar sauce. Barbecue sauce. Teriyaki sauce. Soy sauce, including reduced sodium. Steak sauce. Fish sauce. Oyster sauce. Cocktail sauce. Horseradish. Ketchup and mustard. Meat flavorings and tenderizers. Bouillon cubes. Hot sauce. Tabasco sauce. Marinades. Taco seasonings. Relishes. °Fats and Oils °Butter, stick margarine, lard, shortening, ghee, and bacon fat. Coconut, palm kernel, or palm oils. Regular salad dressings. °Other °Pickles and olives. Salted popcorn and pretzels. °The items listed above may not be a complete list of foods and beverages to avoid. Contact your dietitian for more information. °WHERE CAN I FIND MORE INFORMATION? °National [employer], [employer], [employer]: www.nhlbi.nih.gov/health/health-topics/topics/dash/ °Document Released: 06/28/2011 Document Revised: 11/23/2013 Document Reviewed: 05/13/2013 °ExitCare® Patient Information ©2015 [employer]. This information is not intended to replace advice given to you by your health care provider. Make sure you discuss any questions you have with your health care provider. ° °

## 2014-07-20 NOTE — Progress Notes (Unsigned)
Patient ID: [name], female   DOB: April 20, 1961, 53 y.o.   MRN: [id number] Pt comes in for blood pressure recheck after restating Losartan/HCTZ and added medication Amlodipine 2.5 mg tab during last visit 12/11 Pt states she is taking medication everyday with watching Sodium intake BP- 129/74 64 States she is feeling better without side effects to new medication Instructed pt to continue medications and following DASH diet We will see pt at next scheduled office visit-Nayson Traweek,RN

## 2014-07-26 ENCOUNTER — Encounter: Payer: Self-pay | Admitting: Internal Medicine

## 2014-07-26 ENCOUNTER — Ambulatory Visit (INDEPENDENT_AMBULATORY_CARE_PROVIDER_SITE_OTHER): Payer: BC Managed Care – PPO | Admitting: Internal Medicine

## 2014-07-26 VITALS — BP 124/68 | HR 72 | Temp 99.1°F | Resp 12 | Ht 66.0 in | Wt 206.0 lb

## 2014-07-26 DIAGNOSIS — E139 Other specified diabetes mellitus without complications: Secondary | ICD-10-CM

## 2014-07-26 DIAGNOSIS — E1110 Type 2 diabetes mellitus with ketoacidosis without coma: Secondary | ICD-10-CM

## 2014-07-26 DIAGNOSIS — E1310 Other specified diabetes mellitus with ketoacidosis without coma: Secondary | ICD-10-CM

## 2014-07-26 MED ORDER — INSULIN GLARGINE 100 UNIT/ML SOLOSTAR PEN: 36.0000 [IU] | PEN_INJECTOR | Freq: Every day | SUBCUTANEOUS | Status: DC

## 2014-07-26 MED ORDER — INSULIN ASPART 100 UNIT/ML FLEXPEN: 12.0000 [IU] | PEN_INJECTOR | Freq: Three times a day (TID) | SUBCUTANEOUS | Status: DC

## 2014-07-26 MED ORDER — INSULIN PEN NEEDLE 32G X 4 MM MISC: Status: DC

## 2014-07-26 MED ORDER — SITAGLIPTIN PHOS-METFORMIN HCL 50-1000 MG PO TABS: 1.0000 | ORAL_TABLET | Freq: Two times a day (BID) | ORAL | Status: DC

## 2014-07-26 NOTE — Progress Notes (Signed)
Patient ID: [name], female   DOB: [birth year], 54 y.o.   MRN: [id number]  HPI: [name] is a 54 y.o.-year-old female, referred by her PCP, Dr.Advani, for management of DM2, dx in 2001, previously GDM in 1996, non-insulin-dependent, uncontrolled, with complications (2x DKA episodes, last in 11/2013 - Glu 696, CO2 12).  Last hemoglobin A1c was: Lab Results  Component Value Date   HGBA1C 10.2* 07/14/2014   HGBA1C 10.0 07/02/2014   HGBA1C 9.4 03/10/2014  She gets steroid inj in knee >> last was 06/2014. She gets them q3-4 mo.  Pt is on a regimen of: - JanuMet 50-1000 mg 2x a day - NovoLog 70/30 units 30 units 2x a day - before b'fast and dinner - can be late taking them. If her sugars at bedtime >200 >> takes another 30 units! >> now 10-20 units! Used to be on Lantus 2 years ago >> lost insurance >> started Novolog 70/30.  Pt checks her sugars 4x a day and they are: - am: 90-140 - 2h after b'fast: n/c - before lunch: 200-260 - 2h after lunch: n/c - before dinner: low 100s - 2h after dinner: n/c - bedtime: low 200s - nighttime: n/c She has lows mostly at night or in am. Lowest sugar was 55 (in am); she has hypoglycemia awareness at 100.  Highest sugar was 320s  Glucometer: ReliOn and AccuChek  Pt's meals are: - Breakfast: bacon + eggs + toast, grits - Lunch: salad, sandwich and soups - Dinner: fish/chicken + veggies + rice - Snacks: graham crackers, fruit  - no CKD, last BUN/creatinine:  Lab Results  Component Value Date   BUN 10 07/20/2014   CREATININE 0.86 07/20/2014  On Losartan 100. - last set of lipids: Lab Results  Component Value Date   CHOL 231* 07/20/2014   HDL 50 07/20/2014   LDLCALC 154* 07/20/2014   TRIG 136 07/20/2014   CHOLHDL 4.6 07/20/2014  On Lipitor 40. - last eye exam was in 10/2013. No DR.  - no numbness and tingling in her feet.  Pt has FH of DM in PGM, other relatives on father's side.  She had 2 back surgeries:  04/2013 and 07/14/2014.   ROS: Constitutional: + weight loss (weight gain per our scale), no fatigue, no subjective hyperthermia/hypothermia Eyes: no blurry vision, no xerophthalmia ENT: + sore throat, no nodules palpated in throat, no dysphagia/odynophagia, + hoarseness Cardiovascular: no CP/SOB/palpitations/leg swelling Respiratory: + cough/no SOB Gastrointestinal: no N/V/D/+ C Musculoskeletal: + muscle aches/no joint aches Skin: no rashes Neurological: no tremors/numbness/tingling/dizziness Psychiatric: no depression/+ anxiety + low libido  Past Medical History  Diagnosis Date  . Diabetes mellitus     h/o dka  . Depression   . Constipation   . Fibromyalgia   . Anemia   . Anxiety   . Heart murmur     child  . Hypertension   . GERD (gastroesophageal reflux disease)     tums occ  . Arthritis    Past Surgical History  Procedure Laterality Date  . Knee surgery Right 99    screws  . Cystectomy  96    stomach  . Shoulder surgery  10    right rotator cuff  . Endometrial ablation  08  . Colonoscopy  02/26/2012    SLF: Internal hemorrhoids/TCS IN 10 YEARS WITH PROPOFOL  . Lumbar laminectomy/decompression microdiscectomy N/A 05/14/2013    Procedure: RIGHT L4-L5 DECOMPRESSION AND CYST REMOVAL;  Surgeon: Melina Schools, MD;  Location: [facility];  Service:  Orthopedics;  Laterality: N/A;  . Lumbar percutaneous pedicle screw 1 level N/A 07/14/2014    Procedure: REMOVAL OF L5-S1 PEDICLE SCREWS,FUSION L5-S1, REINSERTION OF PEDICLE SCREWS ILIAC CREST BONE GRAFT;  Surgeon: Melina Schools, MD;  Location: [facility];  Service: Orthopedics;  Laterality: N/A;   History   Social History  . Marital Status: Divorced    Spouse Name: N/A    Number of Children: 4   Occupational History  . [employer]   Social History Main Topics  . Smoking status: Former Smoker -- 0.50 packs/day for 15 years    Types: Cigarettes    Quit date: 04/29/1991  . Smokeless tobacco: Never Used  . Alcohol Use: 0.6 oz/week     1 Glasses of wine per week     Comment: occ wine; not now  . Drug Use: No     Comment: crack/cocaine 20 yrs ago   Current Outpatient Prescriptions on File Prior to Visit  Medication Sig Dispense Refill  . amLODipine (NORVASC) 2.5 MG tablet Take 1 tablet (2.5 mg total) by mouth daily. 90 tablet 3  . aspirin EC 81 MG tablet Take 81 mg by mouth daily.     Marland Kitchen atorvastatin (LIPITOR) 40 MG tablet Take 40 mg by mouth at bedtime.     . docusate sodium (COLACE) 100 MG capsule Take 1 capsule (100 mg total) by mouth 3 (three) times daily as needed for constipation. (Patient not taking: Reported on 07/12/2014) 30 capsule 0  . doxycycline (VIBRAMYCIN) 50 MG capsule Take 2 capsules (100 mg total) by mouth 2 (two) times daily. (Patient not taking: Reported on 07/12/2014) 20 capsule 0  . fexofenadine (ALLEGRA) 180 MG tablet Take 180 mg by mouth daily.     . fluticasone (FLONASE) 50 MCG/ACT nasal spray Place 1 spray into both nostrils daily. 16 g 3  . gabapentin (NEURONTIN) 300 MG capsule Take 1 capsule (300 mg total) by mouth 3 (three) times daily. 90 capsule 0  . losartan-hydrochlorothiazide (HYZAAR) 100-12.5 MG per tablet Take 1 tablet by mouth daily.    . meloxicam (MOBIC) 7.5 MG tablet Take 1 tablet (7.5 mg total) by mouth daily. 30 tablet 1  . methocarbamol (ROBAXIN) 500 MG tablet Take 1 tablet (500 mg total) by mouth 3 (three) times daily as needed for muscle spasms. 60 tablet 0  . metoCLOPramide (REGLAN) 10 MG tablet Take 1 tablet (10 mg total) by mouth every 8 (eight) hours as needed (headache with nausea). (Patient not taking: Reported on 07/12/2014) 20 tablet 0  . ondansetron (ZOFRAN) 4 MG tablet Take 1 tablet (4 mg total) by mouth every 8 (eight) hours as needed for nausea or vomiting. 20 tablet 0  . oxyCODONE-acetaminophen (PERCOCET) 10-325 MG per tablet Take 1 tablet by mouth every 4 (four) hours as needed for pain. 60 tablet 0  . sitaGLIPtin-metformin (JANUMET) 50-1000 MG per tablet Take 1  tablet by mouth 2 (two) times daily with a meal. 60 tablet 3  . traZODone (DESYREL) 100 MG tablet Take 100 mg by mouth at bedtime as needed for sleep.      No current facility-administered medications on file prior to visit.   No Known Allergies Family History  Problem Relation Age of Onset  . Hypertension Mother   . Cancer Mother   . Asthma Sister   . Asthma Brother   . Stomach cancer      distant relative on dad's side of family  . Colon cancer Neg Hx   . Diabetes Paternal  Grandmother    PE: BP 124/68 mmHg  Pulse 72  Temp(Src) 99.1 F (37.3 C) (Oral)  Resp 12  Ht 5\' 6"  (1.676 m)  Wt 206 lb (93.441 kg)  BMI 33.27 kg/m2  SpO2 97% Wt Readings from Last 3 Encounters:  07/26/14 206 lb (93.441 kg)  07/20/14 200 lb (90.719 kg)  07/14/14 194 lb 9.6 oz (88.27 kg)  4 Constitutional: overweight, in NAD Eyes: PERRLA, EOMI, no exophthalmos ENT: moist mucous membranes, no thyromegaly, no cervical lymphadenopathy Cardiovascular: RRR, No MRG Respiratory: CTA B Gastrointestinal: abdomen soft, NT, ND, BS+ Musculoskeletal: no deformities, strength intact in all 4 Skin: moist, warm, no rashes Neurological: no tremor with outstretched hands, DTR normal in all 4  ASSESSMENT: 1. 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 Patient with long-standing, uncontrolled diabetes, on premixed + oral antidiabetic regimen, which became insufficient. She has highs before lunch and after dinner and lows overnight, signs of too much basal insulin and too little mealtime insulin. We discussed that a good insulin regimen will have ~ same amts of basal as bolus insulin per day. Now that she can afford Lantus and Novolog >> I suggested to switch to these: Patient Instructions  Please stop the 70/30 insulin. Start Lantus 36 units daily at bedtime. Start Novolog - inject 15 units before each meal: 12 units for a smaller meal 15  units before a larger meal Continue the JanuMet for now, 50-1000 mg 2x a day with meals.  When injecting insulin:  Inject in the abdomen  Rotate the injection sites around the belly button  Change needle for each injection  Keep needle in for 10 sec after last unit of insulin in  Keep the insulin in use out of the fridge  - I aslo advised her to start injecting in abd. or thigh (now injects in arm) - Strongly advised her to start checking sugars at different times of the day - check 4 times a day, rotating checks - given sugar log and advised how to fill it and to bring it at next appt  - given foot care handout and explained the principles  - given instructions for hypoglycemia management "15-15 rule"  - advised for yearly eye exams >> she is UTD - Return to clinic in 1 mo with sugar log

## 2014-07-26 NOTE — Patient Instructions (Addendum)
Please stop the 70/30 insulin. Start Lantus 36 units daily at bedtime. Start Novolog - inject 15 units before each meal: 12 units for a smaller meal 15 units before a larger meal Continue the JanuMet for now, 50-1000 mg 2x a day with meals.  When injecting insulin:  Inject in the abdomen  Rotate the injection sites around the belly button  Change needle for each injection  Keep needle in for 10 sec after last unit of insulin in  Keep the insulin in use out of the fridge  PATIENT INSTRUCTIONS FOR TYPE 2 DIABETES:  **Please join MyChart!** - see attached instructions about how to join if you have not done so already.  DIET AND EXERCISE Diet and exercise is an important part of diabetic treatment.  We recommended aerobic exercise in the form of brisk walking (working between 40-60% of maximal aerobic capacity, similar to brisk walking) for 150 minutes per week (such as 30 minutes five days per week) along with 3 times per week performing 'resistance' training (using various gauge rubber tubes with handles) 5-10 exercises involving the major muscle groups (upper body, lower body and core) performing 10-15 repetitions (or near fatigue) each exercise. Start at half the above goal but build slowly to reach the above goals. If limited by weight, joint pain, or disability, we recommend daily walking in a swimming pool with water up to waist to reduce pressure from joints while allow for adequate exercise.    BLOOD GLUCOSES Monitoring your blood glucoses is important for continued management of your diabetes. Please check your blood glucoses 2-4 times a day: fasting, before meals and at bedtime (you can rotate these measurements - e.g. one day check before the 3 meals, the next day check before 2 of the meals and before bedtime, etc.).   HYPOGLYCEMIA (low blood sugar) Hypoglycemia is usually a reaction to not eating, exercising, or taking too much insulin/ other diabetes drugs.  Symptoms  include tremors, sweating, hunger, confusion, headache, etc. Treat IMMEDIATELY with 15 grams of Carbs: . 4 glucose tablets .  cup regular juice/soda . 2 tablespoons raisins . 4 teaspoons sugar . 1 tablespoon honey Recheck blood glucose in 15 mins and repeat above if still symptomatic/blood glucose <100.  RECOMMENDATIONS TO REDUCE YOUR RISK OF DIABETIC COMPLICATIONS: * Take your prescribed MEDICATION(S) * Follow a DIABETIC diet: Complex carbs, fiber rich foods, (monounsaturated and polyunsaturated) fats * AVOID saturated/trans fats, high fat foods, >2,300 mg salt per day. * EXERCISE at least 5 times a week for 30 minutes or preferably daily.  * DO NOT SMOKE OR DRINK more than 1 drink a day. * Check your FEET every day. Do not wear tightfitting shoes. Contact us if you develop an ulcer * See your EYE doctor once a year or more if needed * Get a FLU shot once a year * Get a PNEUMONIA vaccine once before and once after age 37 years  GOALS:  * Your Hemoglobin A1c of <7%  * fasting sugars need to be <130 * after meals sugars need to be <180 (2h after you start eating) * Your Systolic BP should be 643 or lower  * Your Diastolic BP should be 80 or lower  * Your HDL (Good Cholesterol) should be 40 or higher  * Your LDL (Bad Cholesterol) should be 100 or lower. * Your Triglycerides should be 150 or lower  * Your Urine microalbumin (kidney function) should be <30 * Your Body Mass Index should be 25 or lower  Please consider the following ways to cut down carbs and fat and increase fiber and micronutrients in your diet: - substitute whole grain for white bread or pasta - substitute brown rice for white rice - substitute 90-calorie flat bread pieces for slices of bread when possible - substitute sweet potatoes or yams for white potatoes - substitute humus for margarine - substitute tofu for cheese when possible - substitute almond or rice milk for regular milk (would not drink soy milk  daily due to concern for soy estrogen influence on breast cancer risk) - substitute dark chocolate for other sweets when possible - substitute water - can add lemon or orange slices for taste - for diet sodas (artificial sweeteners will trick your body that you can eat sweets without getting calories and will lead you to overeating and weight gain in the long run) - do not skip breakfast or other meals (this will slow down the metabolism and will result in more weight gain over time)  - can try smoothies made from fruit and almond/rice milk in am instead of regular breakfast - can also try old-fashioned (not instant) oatmeal made with almond/rice milk in am - order the dressing on the side when eating salad at a restaurant (pour less than half of the dressing on the salad) - eat as little meat as possible - can try juicing, but should not forget that juicing will get rid of the fiber, so would alternate with eating raw veg./fruits or drinking smoothies - use as little oil as possible, even when using olive oil - can dress a salad with a mix of balsamic vinegar and lemon juice, for e.g. - use agave nectar, stevia sugar, or regular sugar rather than artificial sweateners - steam or broil/roast veggies  - snack on veggies/fruit/nuts (unsalted, preferably) when possible, rather than processed foods - reduce or eliminate aspartame in diet (it is in diet sodas, chewing gum, etc) Read the labels!  Try to read Dr. Janene Harvey book: "Program for Reversing Diabetes" for other ideas for healthy eating.

## 2014-08-05 ENCOUNTER — Other Ambulatory Visit: Payer: Self-pay | Admitting: Internal Medicine

## 2014-08-05 ENCOUNTER — Other Ambulatory Visit: Payer: Self-pay | Admitting: *Deleted

## 2014-08-05 MED ORDER — LOSARTAN POTASSIUM-HCTZ 100-12.5 MG PO TABS: 1.0000 | ORAL_TABLET | Freq: Every day | ORAL | Status: DC

## 2014-08-05 NOTE — Telephone Encounter (Signed)
Patient called to request a med refill for Losartan, patient uses [facility] in [place]. Please f/u with pt.

## 2014-08-19 ENCOUNTER — Telehealth: Payer: Self-pay | Admitting: *Deleted

## 2014-08-19 NOTE — Telephone Encounter (Signed)
Patient stated blood pressure elevated, possible form pain medication Advised to call in tomorrow for walk in clinic, if Sx worsen go to UC

## 2014-08-20 ENCOUNTER — Ambulatory Visit: Payer: Medicaid Other | Attending: Internal Medicine

## 2014-08-20 NOTE — Progress Notes (Unsigned)
Patient states that she has been having a headache on and off the past four days Patient was concerned her blood pressure may be the cause Patient also complained of having sinus pressure Patient states she does has a wrist cuff at home but  Realized she has been using it the wrong way Patient blood pressure in office was 139/89 Patient is going to try her allergy medicine which she has not been taking Advised patient to try benadryl as well-patient agreed Patient will call if symptoms do not improve

## 2014-08-24 ENCOUNTER — Ambulatory Visit: Payer: Medicaid Other | Attending: Internal Medicine

## 2014-08-25 ENCOUNTER — Ambulatory Visit (HOSPITAL_COMMUNITY)
Admission: RE | Admit: 2014-08-25 | Discharge: 2014-08-25 | Disposition: A | Discharge status: Home / Self Care | Payer: BLUE CROSS/BLUE SHIELD | Source: Ambulatory Visit | Attending: Orthopedic Surgery | Admitting: Orthopedic Surgery

## 2014-08-25 DIAGNOSIS — Z981 Arthrodesis status: Secondary | ICD-10-CM | POA: Insufficient documentation

## 2014-08-25 DIAGNOSIS — R531 Weakness: Secondary | ICD-10-CM | POA: Insufficient documentation

## 2014-08-25 DIAGNOSIS — M545 Low back pain: Secondary | ICD-10-CM | POA: Diagnosis not present

## 2014-08-25 DIAGNOSIS — M4326 Fusion of spine, lumbar region: Secondary | ICD-10-CM

## 2014-08-25 DIAGNOSIS — M25552 Pain in left hip: Secondary | ICD-10-CM | POA: Diagnosis not present

## 2014-08-25 DIAGNOSIS — R29898 Other symptoms and signs involving the musculoskeletal system: Secondary | ICD-10-CM

## 2014-08-25 DIAGNOSIS — R262 Difficulty in walking, not elsewhere classified: Secondary | ICD-10-CM | POA: Diagnosis not present

## 2014-08-25 DIAGNOSIS — M5442 Lumbago with sciatica, left side: Secondary | ICD-10-CM
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
## 2014-08-26 ENCOUNTER — Ambulatory Visit (INDEPENDENT_AMBULATORY_CARE_PROVIDER_SITE_OTHER): Payer: Medicaid Other | Admitting: Internal Medicine

## 2014-08-26 ENCOUNTER — Encounter: Payer: Self-pay | Admitting: Internal Medicine

## 2014-08-26 VITALS — BP 122/64 | HR 78 | Temp 98.3°F | Resp 12 | Wt 193.0 lb

## 2014-08-26 DIAGNOSIS — E1110 Type 2 diabetes mellitus with ketoacidosis without coma: Secondary | ICD-10-CM

## 2014-08-26 DIAGNOSIS — E1310 Other specified diabetes mellitus with ketoacidosis without coma: Secondary | ICD-10-CM

## 2014-08-26 MED ORDER — INSULIN GLARGINE 100 UNIT/ML SOLOSTAR PEN: 30.0000 [IU] | PEN_INJECTOR | Freq: Every day | SUBCUTANEOUS | Status: DC

## 2014-08-26 NOTE — Progress Notes (Signed)
Patient ID: [name], female   DOB: 10-09-1960, 54 y.o.   MRN: [id number]  HPI: [name] is a 54 y.o.-year-old female, returning for f/u for DM2, dx in 2001, previously GDM in 1996, non-insulin-dependent, uncontrolled, with complications (2x DKA episodes, last in 11/2013 - Glu 696, CO2 12). Last visit 1 mo ago.  Last hemoglobin A1c was: Lab Results  Component Value Date   HGBA1C 10.2* 07/14/2014   HGBA1C 10.0 07/02/2014   HGBA1C 9.4 03/10/2014  She gets steroid inj in knee >> last was 06/2014. She gets them q3-4 mo.  Pt was on a regimen of: - JanuMet 50-1000 mg 2x a day - NovoLog 70/30 units 30 units 2x a day - before b'fast and dinner - can be late taking them. If her sugars at bedtime >200 >> takes another 30 units! >> now 10-20 units! Used to be on Lantus 2 years ago >> lost insurance >> started Novolog 70/30.  At last visit, since she could again afford Lantus and Novolog, we changed to: - Lantus 30 units daily at bedtime. - Novolog - injecting 12 units for a smaller meal 15 units before a larger meal - JanuMet 50-1000 mg 2x a day with meals.  Pt checks her sugars 4-8x a day and she now has low CBGs: - am: 90-140 >> 57-181 - 2h after b'fast: n/c >> 46 (activity), 53, 111-228 - before lunch: 200-260 >> 116-210 - 2h after lunch: n/c >> 40 x1, 104-176, 222 - before dinner: low 100s >> 41-160, 190 - 2h after dinner: n/c >> 103-172 - bedtime: low 200s >> 51, 74, 179-254 - nighttime: n/c >> 156, 172 She has lows mostly at night or in am. Lowest sugar was 55 (in am); she has hypoglycemia awareness at 100.  Highest sugar was 320s  Glucometer: ReliOn and AccuChek  Pt's meals are: - Breakfast: bacon + eggs + toast, grits - Lunch: salad, sandwich and soups - Dinner: fish/chicken + veggies + rice - Snacks: graham crackers, fruit  - no CKD, last BUN/creatinine:  Lab Results  Component Value Date   BUN 10 07/20/2014   CREATININE 0.86 07/20/2014  On  Losartan 100. - last set of lipids: Lab Results  Component Value Date   CHOL 231* 07/20/2014   HDL 50 07/20/2014   LDLCALC 154* 07/20/2014   TRIG 136 07/20/2014   CHOLHDL 4.6 07/20/2014  On Lipitor 40. - last eye exam was in 10/2013. No DR.  - no numbness and tingling in her feet.  She had 2 back surgeries: 04/2013 and 07/14/2014.   ROS: Constitutional: + weight loss, no fatigue, + hot flushes, + poor sleep Eyes: no blurry vision, no xerophthalmia ENT: no sore throat, no nodules palpated in throat, no dysphagia/odynophagia, no hoarseness Cardiovascular: no CP/SOB/palpitations/+ hand swelling Respiratory: no cough/no SOB Gastrointestinal: + N/no V/D/+ C Musculoskeletal: no muscle aches/+ joint aches Skin: no rashes Neurological: no tremors/numbness/tingling/dizziness + low libido  I reviewed pt's medications, allergies, PMH, social hx, family hx, and changes were documented in the history of present illness. Otherwise, unchanged from my initial visit note: Past Medical History  Diagnosis Date  . Diabetes mellitus     h/o dka  . Depression   . Constipation   . Fibromyalgia   . Anemia   . Anxiety   . Heart murmur     child  . Hypertension   . GERD (gastroesophageal reflux disease)     tums occ  . Arthritis    Past  Surgical History  Procedure Laterality Date  . Knee surgery Right 99    screws  . Cystectomy  96    stomach  . Shoulder surgery  10    right rotator cuff  . Endometrial ablation  08  . Colonoscopy  02/26/2012    SLF: Internal hemorrhoids/TCS IN 10 YEARS WITH PROPOFOL  . Lumbar laminectomy/decompression microdiscectomy N/A 05/14/2013    Procedure: RIGHT L4-L5 DECOMPRESSION AND CYST REMOVAL;  Surgeon: Melina Schools, MD;  Location: [place];  Service: Orthopedics;  Laterality: N/A;  . Lumbar percutaneous pedicle screw 1 level N/A 07/14/2014    Procedure: REMOVAL OF L5-S1 PEDICLE SCREWS,FUSION L5-S1, REINSERTION OF PEDICLE SCREWS ILIAC CREST BONE GRAFT;   Surgeon: Melina Schools, MD;  Location: [place];  Service: Orthopedics;  Laterality: N/A;   History   Social History  . Marital Status: Divorced    Spouse Name: N/A    Number of Children: 4   Occupational History  . [employer]   Social History Main Topics  . Smoking status: Former Smoker -- 0.50 packs/day for 15 years    Types: Cigarettes    Quit date: 04/29/1991  . Smokeless tobacco: Never Used  . Alcohol Use: 0.6 oz/week    1 Glasses of wine per week     Comment: occ wine; not now  . Drug Use: No     Comment: crack/cocaine 20 yrs ago   Current Outpatient Prescriptions on File Prior to Visit  Medication Sig Dispense Refill  . amLODipine (NORVASC) 2.5 MG tablet Take 1 tablet (2.5 mg total) by mouth daily. 90 tablet 3  . aspirin EC 81 MG tablet Take 81 mg by mouth daily.     Marland Kitchen atorvastatin (LIPITOR) 40 MG tablet Take 40 mg by mouth at bedtime.     . docusate sodium (COLACE) 100 MG capsule Take 1 capsule (100 mg total) by mouth 3 (three) times daily as needed for constipation. 30 capsule 0  . doxycycline (VIBRAMYCIN) 50 MG capsule Take 2 capsules (100 mg total) by mouth 2 (two) times daily. 20 capsule 0  . fexofenadine (ALLEGRA) 180 MG tablet Take 180 mg by mouth daily.     . fluticasone (FLONASE) 50 MCG/ACT nasal spray Place 1 spray into both nostrils daily. 16 g 3  . gabapentin (NEURONTIN) 300 MG capsule Take 1 capsule (300 mg total) by mouth 3 (three) times daily. 90 capsule 0  . insulin aspart (NOVOLOG FLEXPEN) 100 UNIT/ML FlexPen Inject 12-15 Units into the skin 3 (three) times daily with meals. 15 mL 2  . Insulin Glargine (LANTUS SOLOSTAR) 100 UNIT/ML Solostar Pen Inject 36 Units into the skin at bedtime. 15 mL 2  . Insulin Pen Needle (BD PEN NEEDLE NANO U/F) 32G X 4 MM MISC Use 4x a day 200 each 11  . losartan-hydrochlorothiazide (HYZAAR) 100-12.5 MG per tablet Take 1 tablet by mouth daily. 30 tablet 3  . meloxicam (MOBIC) 15 MG tablet Take 15 mg by mouth daily.   0  . meloxicam  (MOBIC) 7.5 MG tablet Take 1 tablet (7.5 mg total) by mouth daily. 30 tablet 1  . methocarbamol (ROBAXIN) 500 MG tablet Take 1 tablet (500 mg total) by mouth 3 (three) times daily as needed for muscle spasms. 60 tablet 0  . metoCLOPramide (REGLAN) 10 MG tablet Take 1 tablet (10 mg total) by mouth every 8 (eight) hours as needed (headache with nausea). 20 tablet 0  . ondansetron (ZOFRAN) 4 MG tablet Take 1 tablet (4 mg total) by  mouth every 8 (eight) hours as needed for nausea or vomiting. 20 tablet 0  . oxyCODONE-acetaminophen (PERCOCET) 10-325 MG per tablet Take 1 tablet by mouth every 4 (four) hours as needed for pain. 60 tablet 0  . sitaGLIPtin-metformin (JANUMET) 50-1000 MG per tablet Take 1 tablet by mouth 2 (two) times daily with a meal. 60 tablet 2  . traZODone (DESYREL) 100 MG tablet Take 100 mg by mouth at bedtime as needed for sleep.     . TRUETEST TEST test strip 1 each by Other route 4 (four) times daily.   12   No current facility-administered medications on file prior to visit.   No Known Allergies Family History  Problem Relation Age of Onset  . Hypertension Mother   . Cancer Mother   . Asthma Sister   . Asthma Brother   . Stomach cancer      distant relative on dad's side of family  . Colon cancer Neg Hx   . Diabetes Paternal Grandmother    PE: BP 122/64 mmHg  Pulse 78  Temp(Src) 98.3 F (36.8 C) (Oral)  Resp 12  Wt 193 lb (87.544 kg)  SpO2 98% Wt Readings from Last 3 Encounters:  08/26/14 193 lb (87.544 kg)  08/20/14 195 lb 9.6 oz (88.724 kg)  07/26/14 206 lb (93.441 kg)   Constitutional: overweight, in NAD Eyes: PERRLA, EOMI, no exophthalmos ENT: moist mucous membranes, no thyromegaly, no cervical lymphadenopathy Cardiovascular: RRR, No MRG Respiratory: CTA B Gastrointestinal: abdomen soft, NT, ND, BS+ Musculoskeletal: no deformities, strength intact in all 4 Skin: moist, warm, no rashes Neurological: no tremor with outstretched hands, DTR normal in all  4  ASSESSMENT: 1. 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 Patient with long-standing, uncontrolled diabetes. We switched her from premixed to basal-bolus + oral antidiabetic regimen, with better control but also many low CBGs now, mostly after her pm walk. Will decrease her mealtime insulin since she gets 30 units of basal insulin a day and ~15 units of mealtime insulin a day. She would prefer a ICR >> will use 1:5. If still lows >> she will contact me >> will need to decrease Lantus or to relax the ICR, depending on the timing of the low CBGs. Patient Instructions  Please continue Lantus 30 units at bedtime Decrease NovoLog insulin as follows - use a insulin to carb ratio (ICR) of 1:5. Please decrease the calculated dose by 4 units if you plan to be active after that meal.  Continue Janumet 50-1000 mg 2x a day with meals.  Please let me know in 1-2 weeks if sugars continue to drop <80, we may need to decrease Lantus in that case.  Please schedule an appt with Evangeline Gula for a carb counting refresher.  Please return in 1.5 month with your sugar log.   - will refer to nutrition for a refresher on carb counting - continue checking sugars at different times of the day - check 4 times a day, rotating checks - advised for yearly eye exams >> she is UTD - Return to clinic in 1.5 mo with sugar log

## 2014-08-26 NOTE — Patient Instructions (Addendum)
Please continue Lantus 30 units at bedtime Decrease NovoLog insulin as follows - use a insulin to carb ratio (ICR) of 1:5. Please decrease the calculated dose by 4 units if you plan to be active after that meal.  Continue Janumet 50-1000 mg 2x a day with meals.  Please let me know in 1-2 weeks if sugars continue to drop <80, we may need to decrease Lantus in that case.  Please schedule an appt with Antonieta Iba for a carb counting refresher.  Please return in 1.5 month with your sugar log.

## 2014-08-27 ENCOUNTER — Ambulatory Visit (HOSPITAL_COMMUNITY)
Admission: RE | Admit: 2014-08-27 | Payer: BLUE CROSS/BLUE SHIELD | Source: Ambulatory Visit | Admitting: Physical Therapy

## 2014-08-27 NOTE — Progress Notes (Signed)
Received communication from [place] that [place] needs prior authorization for patient.  Janumet 50-1000 mg tablet (one tablet twice daily with meals) last prescribed by Dr. Philemon Kingdom on 07/26/14.  Spoke with [employer] and [employer] and updated.  Pharmacist indicates communication will be sent to Dr. Arman Filter office that prior authorization needed for medication.

## 2014-08-30 ENCOUNTER — Ambulatory Visit (INDEPENDENT_AMBULATORY_CARE_PROVIDER_SITE_OTHER): Payer: BLUE CROSS/BLUE SHIELD | Admitting: Obstetrics & Gynecology

## 2014-08-30 ENCOUNTER — Encounter: Payer: Self-pay | Admitting: Obstetrics & Gynecology

## 2014-08-30 VITALS — BP 140/80 | Wt 199.0 lb

## 2014-08-30 DIAGNOSIS — N72 Inflammatory disease of cervix uteri: Secondary | ICD-10-CM

## 2014-08-30 DIAGNOSIS — N898 Other specified noninflammatory disorders of vagina: Secondary | ICD-10-CM

## 2014-08-30 MED ORDER — METRONIDAZOLE 500 MG PO TABS: 500.0000 mg | ORAL_TABLET | Freq: Two times a day (BID) | ORAL | Status: DC

## 2014-08-30 NOTE — Progress Notes (Signed)
Patient ID: [name], female   DOB: [birth year], 54 y.o.   MRN: [id number] Chief Complaint  Patient presents with  . gyn visit    vaginal discharge-yellow in color/ no odor.     Pt with complaints of a yellowish greensih discharge for a couple of weeks No odor Some irritation and burning no itching  No burning with urination, frequency or urgency No nausea, vomiting or diarrhea Nor fever chills or other constitutional symptoms  Exam NEFG Vagina thin grey discharge Cervix no lesions  Wet prep +heavy WBC infiltrate No BV NO trich No yeast  Cervicitis/vaginitis: metronidazole 500 mg BID x 7 days

## 2014-08-31 ENCOUNTER — Ambulatory Visit (HOSPITAL_COMMUNITY)
Admission: RE | Admit: 2014-08-31 | Discharge: 2014-08-31 | Disposition: A | Discharge status: Home / Self Care | Payer: BLUE CROSS/BLUE SHIELD | Source: Ambulatory Visit | Attending: Orthopedic Surgery | Admitting: Orthopedic Surgery

## 2014-08-31 DIAGNOSIS — M4326 Fusion of spine, lumbar region: Secondary | ICD-10-CM

## 2014-08-31 DIAGNOSIS — M5442 Lumbago with sciatica, left side: Secondary | ICD-10-CM

## 2014-08-31 DIAGNOSIS — R262 Difficulty in walking, not elsewhere classified: Secondary | ICD-10-CM

## 2014-08-31 DIAGNOSIS — R29898 Other symptoms and signs involving the musculoskeletal system: Secondary | ICD-10-CM

## 2014-08-31 DIAGNOSIS — M545 Low back pain: Secondary | ICD-10-CM | POA: Diagnosis not present

## 2014-08-31 LAB — HM DIABETES EYE EXAM

## 2014-08-31 NOTE — Therapy (Signed)
[place], [name], [address] Phone: [phone]   Fax:  [phone]  Physical Therapy Treatment  Patient Details  Name: [name] MRN: [id number] Date of Birth: [birth year] Referring Provider:  Melina Schools, MD  Encounter Date: 08/31/2014      PT End of Session - 08/31/14 0942    Visit Number 2   Number of Visits 16   Date for PT Re-Evaluation 09/24/14   Authorization Type BCBS   Authorization - Visit Number 2   Authorization - Number of Visits 16   PT Start Time 0903   PT Stop Time 0940   PT Time Calculation (min) 37 min   Activity Tolerance Patient tolerated treatment well   Behavior During Therapy [facility] for tasks assessed/performed      Past Medical History  Diagnosis Date  . Diabetes mellitus     h/o dka  . Depression   . Constipation   . Fibromyalgia   . Anemia   . Anxiety   . Heart murmur     child  . Hypertension   . GERD (gastroesophageal reflux disease)     tums occ  . Arthritis     Past Surgical History  Procedure Laterality Date  . Knee surgery Right 99    screws  . Cystectomy  96    stomach  . Shoulder surgery  10    right rotator cuff  . Endometrial ablation  08  . Colonoscopy  02/26/2012    SLF: Internal hemorrhoids/TCS IN 10 YEARS WITH PROPOFOL  . Lumbar laminectomy/decompression microdiscectomy N/A 05/14/2013    Procedure: RIGHT L4-L5 DECOMPRESSION AND CYST REMOVAL;  Surgeon: Melina Schools, MD;  Location: [place];  Service: Orthopedics;  Laterality: N/A;  . Lumbar percutaneous pedicle screw 1 level N/A 07/14/2014    Procedure: REMOVAL OF L5-S1 PEDICLE SCREWS,FUSION L5-S1, REINSERTION OF PEDICLE SCREWS ILIAC CREST BONE GRAFT;  Surgeon: Melina Schools, MD;  Location: [place];  Service: Orthopedics;  Laterality: N/A;    There were no vitals taken for this visit.  Visit Diagnosis:  Bilateral low back pain with left-sided sciatica  Weakness of left hip  Difficulty  walking  Fusion of lumbar spine      Subjective Assessment - 08/31/14 0908    Symptoms No complaints of pain as pt took her percocet prior to PT; pt reports she did wake up with some stiffness this morning.  Pt reports she has taking her pain medication only as needed.     Currently in Pain? No/denies          [facility] PT Assessment - 08/31/14 0001    Assessment   Medical Diagnosis Low back pain s/p revision of fusion.    Onset Date 07/14/14   Next MD Visit Melina Schools, 09/24/14                  [facility] Adult PT Treatment/Exercise - 08/31/14 0001    Exercises   Exercises Lumbar   Lumbar Exercises: Stretches   Passive Hamstring Stretch 2 reps;30 seconds   Passive Hamstring Stretch Limitations Gastroc Stretch, Slantboard   Hip Flexor Stretch 1 rep;30 seconds   Hip Flexor Stretch Limitations 12" Step, +groin stretch   Quad Stretch 2 reps;30 seconds   Quad Stretch Limitations Prone with rope   Piriformis Stretch 2 reps;30 seconds   Piriformis Stretch Limitations Seated   Lumbar Exercises: Standing   Other Standing Lumbar Exercises 3D Hip Excursion x10   Other  Standing Lumbar Exercises Monster Walk, RTB 20' RT   Lumbar Exercises: Prone   Straight Leg Raise 10 reps                PT Education - 08/31/14 [phone]    Education provided Yes   Education Details HEP : piriformis stretch, quad stretch, hip extension, ab set, bridge   Person(s) Educated Patient   Methods Explanation;Demonstration;Handout   Comprehension Verbalized understanding;Returned demonstration          PT Short Term Goals - 08/31/14 0945    PT SHORT TERM GOAL #1   Title Patient will demsontrate increased hip internal rotation bilaterally to 30 degrees to improve deceleration mechanics of gait   Status On-going   PT SHORT TERM GOAL #2   Title Patient will demonstrate increased hamstring strength to 4/5 MMT bilaterally to increased hip and knee stability to gait to decrease strain on lumbar  spine.    Status On-going   PT SHORT TERM GOAL #3   Title Patient will demonstrate increased quadriceps flexibility bilaterally to 125 degrees or greater to increase gait efficiency.    Status On-going   PT SHORT TERM GOAL #4   Title Patient will dmeonstrate increased Glut med/max strength to 3/5 MMT bilaterally.    Status On-going           PT Long Term Goals - 08/31/14 0945    PT LONG TERM GOAL #1   Title Patient will be bale to walk >87minutes withotu complain of pain.    Status On-going   PT LONG TERM GOAL #2   Title Patient will demonstrate increased hamstring strength of 5/5 MMT to improve ability to go up and down stairs withtout hand rail assistance.    Status On-going   PT LONG TERM GOAL #3   Title Patient will demosntrate increased abdominal strength to 4/5 MMT by demosntrateing bilateral SLR to indicate improved trunk stability.    Status On-going   PT LONG TERM GOAL #4   Title Patient will demsontrate independence with HEP.    Status On-going               Plan - 08/31/14 0945    Clinical Impression Statement PT POC initaited today, focusing on stretching, mobility, and stabilization program.  Pt reports no complaints of pain during PT visit as she took her pain medicaiton this morning, though does report increased stretching on the Lt > Rt side today.  Updated HEP, educating pt on importance of continued performance of exercises at home.  Reviewed previous HEP, and adjusted technique to hip flexor stretch for correct form.    PT Treatment/Interventions Gait training;Stair training;Patient/family education;Passive range of motion;Functional mobility training;Therapeutic activities;Therapeutic exercise;Manual techniques;Balance training   PT Next Visit Plan Begin hamstring/glut strengtheing exrercises, adding lunging to 6" step.         Problem List Patient Active Problem List   Diagnosis Date Noted  . Lumbar pseudoarthrosis 07/14/2014  . Chronic low back  pain 12/08/2013  . Nasal congestion 12/08/2013  . DM (diabetes mellitus) type 2, uncontrolled, with ketoacidosis 11/25/2013  . HTN (hypertension) 11/25/2013  . Headache(784.0) 11/25/2013  . Hyperkalemia 04/13/2012  . Anemia 04/13/2012  . Depression 04/13/2012  . Diabetic neuropathy 04/13/2012  . Constipation 02/16/2012  . Pharyngitis 05/02/2011  . Uterine bleeding 05/02/2011  . Diabetes mellitus high risk 05/02/2011  . Hypokalemia 05/02/2011    Lonna Cobb, DPT [phone]  08/31/2014, 9:47 AM  [facility] [address]  [address] [place], [name], [address] Phone: [phone]   Fax:  [phone]

## 2014-08-31 NOTE — Patient Instructions (Addendum)
Piriformis Stretch, Sitting   Sit, one ankle on opposite knee, same-side hand on crossed knee. Push down on knee, keeping spine straight. Lean torso forward, with flat back, until tension is felt in hamstrings and gluteals of crossed-leg side. Hold 30 seconds.   Repeat 2-3 times per session. Do 1 sessions per day.  KNEE: Quadriceps - Prone   Place strap around ankle. Bring ankle toward buttocks. Press hip into surface.  Hold 30 seconds.   Repeat 2-3 times per session. Do 1 sessions per day.  (Home) Extension: Hip   With support under abdomen, tighten stomach. Lift right leg in line with body. Do not hyperextend. Alternate legs.  Repeat 10 times. Do 1 time a day.  Abdominal Bracing With Pelvic Floor (Hook-Lying)   With neutral spine, tighten pelvic floor and abdominals. Hold for 3-5 seconds.  Repeat 10 times. Do 1 time a day.  Bracing With Bridging (Hook-Lying)  With neutral spine, tighten pelvic floor and abdominals and hold. Lift bottom. Repeat 10 times. Do 1 time a day.

## 2014-09-02 ENCOUNTER — Telehealth: Payer: Self-pay | Admitting: *Deleted

## 2014-09-02 ENCOUNTER — Ambulatory Visit (HOSPITAL_COMMUNITY)
Admission: RE | Admit: 2014-09-02 | Discharge: 2014-09-02 | Disposition: A | Discharge status: Home / Self Care | Payer: BLUE CROSS/BLUE SHIELD | Source: Ambulatory Visit | Attending: Orthopedic Surgery | Admitting: Orthopedic Surgery

## 2014-09-02 ENCOUNTER — Other Ambulatory Visit: Payer: Self-pay | Admitting: *Deleted

## 2014-09-02 DIAGNOSIS — M5442 Lumbago with sciatica, left side: Secondary | ICD-10-CM

## 2014-09-02 DIAGNOSIS — R29898 Other symptoms and signs involving the musculoskeletal system: Secondary | ICD-10-CM

## 2014-09-02 DIAGNOSIS — R262 Difficulty in walking, not elsewhere classified: Secondary | ICD-10-CM

## 2014-09-02 DIAGNOSIS — M545 Low back pain: Secondary | ICD-10-CM | POA: Diagnosis not present

## 2014-09-02 DIAGNOSIS — M4326 Fusion of spine, lumbar region: Secondary | ICD-10-CM

## 2014-09-02 MED ORDER — SAXAGLIPTIN-METFORMIN ER 2.5-1000 MG PO TB24: 2.0000 | ORAL_TABLET | ORAL | Status: DC

## 2014-09-02 NOTE — Telephone Encounter (Signed)
Called pt and lvm advising her that her Janumet was denied by her ins. The alternative medication is Kombiglyze. Dr Cruzita Lederer has advised that you take 2.5/1,000 mg; 2 tabs a day in the am.  Advised pt to call back with any questions. Sending rx to pt's pharmacy.

## 2014-09-02 NOTE — Telephone Encounter (Signed)
Sent to the wrong pharmacy. Resent to [facility].

## 2014-09-02 NOTE — Therapy (Signed)
[place], [name], [address] Phone: [phone]   Fax:  [phone]  Physical Therapy Treatment  Patient Details  Name: [name] MRN: [id number] Date of Birth: [birth year] Referring Provider:  Melina Schools, MD  Encounter Date: 09/02/2014      PT End of Session - 09/02/14 0954    Visit Number 3   Number of Visits 16   Date for PT Re-Evaluation 09/24/14   Authorization Type BCBS   Authorization - Visit Number 3   Authorization - Number of Visits 16   PT Start Time [phone]   PT Stop Time 1016   PT Time Calculation (min) 34 min   Activity Tolerance Patient tolerated treatment well   Behavior During Therapy [facility] for tasks assessed/performed      Past Medical History  Diagnosis Date  . Diabetes mellitus     h/o dka  . Depression   . Constipation   . Fibromyalgia   . Anemia   . Anxiety   . Heart murmur     child  . Hypertension   . GERD (gastroesophageal reflux disease)     tums occ  . Arthritis     Past Surgical History  Procedure Laterality Date  . Knee surgery Right 99    screws  . Cystectomy  96    stomach  . Shoulder surgery  10    right rotator cuff  . Endometrial ablation  08  . Colonoscopy  02/26/2012    SLF: Internal hemorrhoids/TCS IN 10 YEARS WITH PROPOFOL  . Lumbar laminectomy/decompression microdiscectomy N/A 05/14/2013    Procedure: RIGHT L4-L5 DECOMPRESSION AND CYST REMOVAL;  Surgeon: Melina Schools, MD;  Location: [place];  Service: Orthopedics;  Laterality: N/A;  . Lumbar percutaneous pedicle screw 1 level N/A 07/14/2014    Procedure: REMOVAL OF L5-S1 PEDICLE SCREWS,FUSION L5-S1, REINSERTION OF PEDICLE SCREWS ILIAC CREST BONE GRAFT;  Surgeon: Melina Schools, MD;  Location: [place];  Service: Orthopedics;  Laterality: N/A;    There were no vitals taken for this visit.  Visit Diagnosis:  Bilateral low back pain with left-sided sciatica  Weakness of left hip  Difficulty  walking  Fusion of lumbar spine      Subjective Assessment - 09/02/14 0947    Symptoms Pain free today, complliant with HEP daily.     Currently in Pain? No/denies                    [facility] Adult PT Treatment/Exercise - 09/02/14 0948    Exercises   Exercises Lumbar   Lumbar Exercises: Stretches   Active Hamstring Stretch 3 reps;30 seconds   Active Hamstring Stretch Limitations 3 directions on 14in step   Hip Flexor Stretch 3 reps;20 seconds   Hip Flexor Stretch Limitations 14in step   Quad Stretch 3 reps;30 seconds   Quad Stretch Limitations Prone with rope   Piriformis Stretch 3 reps;30 seconds   Piriformis Stretch Limitations supine figure 4 with towel   Lumbar Exercises: Standing   Other Standing Lumbar Exercises 3D Hip Excursion x15                PT Education - 09/02/14 6712    Education provided Yes   Education Details Explaination on brace purpose   Person(s) Educated Patient   Methods Explanation   Comprehension Verbalized understanding          PT Short Term Goals - 09/02/14 1004    PT SHORT  TERM GOAL #1   Title Patient will demsontrate increased hip internal rotation bilaterally to 30 degrees to improve deceleration mechanics of gait   Status On-going   PT SHORT TERM GOAL #2   Title Patient will demonstrate increased hamstring strength to 4/5 MMT bilaterally to increased hip and knee stability to gait to decrease strain on lumbar spine.    Status On-going   PT SHORT TERM GOAL #3   Title Patient will demonstrate increased quadriceps flexibility bilaterally to 125 degrees or greater to increase gait efficiency.    Status On-going   PT SHORT TERM GOAL #4   Title Patient will dmeonstrate increased Glut med/max strength to 3/5 MMT bilaterally.    Status On-going           PT Long Term Goals - 09/02/14 1005    PT LONG TERM GOAL #1   Title Patient will be bale to walk >41minutes withotu complain of pain.    Status On-going   PT LONG  TERM GOAL #2   Title Patient will demonstrate increased hamstring strength of 5/5 MMT to improve ability to go up and down stairs withtout hand rail assistance.    PT LONG TERM GOAL #3   Title Patient will demosntrate increased abdominal strength to 4/5 MMT by demosntrateing bilateral SLR to indicate improved trunk stability.    PT LONG TERM GOAL #4   Title Patient will demsontrate independence with HEP.    Status On-going               Plan - 09/02/14 0955    Clinical Impression Statement Pt late for apt today.  Pt wearing back brace initially this session, pt explained purpose of brace and removed for stretches and exercises.  Continued with focus on improving hip mobilty with excursion exercises and stretches and progressed hamstring and gluteal strengthening with forward lunge on 6 in step for weight loading.  Therapist facilitaitn to improve weight loading with squats and lunges .  Pt stated she has slight increased pain lower back 2/10 at end of session from newactivities and completeing without brace.   PT Next Visit Plan Continue with current PT POC, continue mobility, stretches and progress strength.  Begin side lunges as able        Problem List Patient Active Problem List   Diagnosis Date Noted  . Lumbar pseudoarthrosis 07/14/2014  . Chronic low back pain 12/08/2013  . Nasal congestion 12/08/2013  . DM (diabetes mellitus) type 2, uncontrolled, with ketoacidosis 11/25/2013  . HTN (hypertension) 11/25/2013  . Headache(784.0) 11/25/2013  . Hyperkalemia 04/13/2012  . Anemia 04/13/2012  . Depression 04/13/2012  . Diabetic neuropathy 04/13/2012  . Constipation 02/16/2012  . Pharyngitis 05/02/2011  . Uterine bleeding 05/02/2011  . Diabetes mellitus high risk 05/02/2011  . Hypokalemia 05/02/2011   Ihor Austin, [place]  Aldona Lento 09/02/2014, 10:25 AM  [place], [name],  [address] Phone: [phone]   Fax:  [phone]

## 2014-09-03 ENCOUNTER — Other Ambulatory Visit: Payer: Self-pay | Admitting: *Deleted

## 2014-09-03 ENCOUNTER — Other Ambulatory Visit: Payer: Self-pay | Admitting: Internal Medicine

## 2014-09-03 MED ORDER — GABAPENTIN 300 MG PO CAPS: 300.0000 mg | ORAL_CAPSULE | Freq: Three times a day (TID) | ORAL | Status: DC

## 2014-09-03 NOTE — Telephone Encounter (Signed)
Rx refilled.

## 2014-09-03 NOTE — Telephone Encounter (Signed)
Patient is calling to request a med refill for Gabapentin, please f/u with pt.

## 2014-09-07 ENCOUNTER — Telehealth: Payer: Self-pay | Admitting: Internal Medicine

## 2014-09-07 ENCOUNTER — Telehealth: Payer: Self-pay

## 2014-09-07 NOTE — Telephone Encounter (Signed)
Patient called requesting a refill on her losartan Prescription was filled at the end of January with three refills Left message on voice mail if she has any questions to call the office

## 2014-09-07 NOTE — Telephone Encounter (Signed)
Pt is calling requesting refill on medication, is returning nurse's phone call . Please f/u with pt

## 2014-09-08 ENCOUNTER — Ambulatory Visit (HOSPITAL_COMMUNITY): Payer: BLUE CROSS/BLUE SHIELD | Admitting: Physical Therapy

## 2014-09-08 DIAGNOSIS — R262 Difficulty in walking, not elsewhere classified: Secondary | ICD-10-CM

## 2014-09-08 DIAGNOSIS — R29898 Other symptoms and signs involving the musculoskeletal system: Secondary | ICD-10-CM

## 2014-09-08 DIAGNOSIS — M4326 Fusion of spine, lumbar region: Secondary | ICD-10-CM

## 2014-09-08 DIAGNOSIS — M545 Low back pain: Secondary | ICD-10-CM | POA: Diagnosis not present

## 2014-09-08 DIAGNOSIS — M5442 Lumbago with sciatica, left side: Secondary | ICD-10-CM

## 2014-09-08 NOTE — Therapy (Signed)
[place], [name], [address] Phone: [phone]   Fax:  [phone]  Physical Therapy Treatment  Patient Details  Name: [name] MRN: [id number] Date of Birth: [birth year] Referring Provider:  Melina Schools, MD  Encounter Date: 09/08/2014      PT End of Session - 09/08/14 1021    Visit Number 4   Number of Visits 16   Date for PT Re-Evaluation 09/24/14   Authorization Type BCBS   Authorization - Visit Number 4   Authorization - Number of Visits 16   PT Start Time 0940   PT Stop Time 1019   PT Time Calculation (min) 39 min   Activity Tolerance Patient tolerated treatment well   Behavior During Therapy [facility] for tasks assessed/performed      Past Medical History  Diagnosis Date  . Diabetes mellitus     h/o dka  . Depression   . Constipation   . Fibromyalgia   . Anemia   . Anxiety   . Heart murmur     child  . Hypertension   . GERD (gastroesophageal reflux disease)     tums occ  . Arthritis     Past Surgical History  Procedure Laterality Date  . Knee surgery Right 99    screws  . Cystectomy  96    stomach  . Shoulder surgery  10    right rotator cuff  . Endometrial ablation  08  . Colonoscopy  02/26/2012    SLF: Internal hemorrhoids/TCS IN 10 YEARS WITH PROPOFOL  . Lumbar laminectomy/decompression microdiscectomy N/A 05/14/2013    Procedure: RIGHT L4-L5 DECOMPRESSION AND CYST REMOVAL;  Surgeon: Melina Schools, MD;  Location: [place];  Service: Orthopedics;  Laterality: N/A;  . Lumbar percutaneous pedicle screw 1 level N/A 07/14/2014    Procedure: REMOVAL OF L5-S1 PEDICLE SCREWS,FUSION L5-S1, REINSERTION OF PEDICLE SCREWS ILIAC CREST BONE GRAFT;  Surgeon: Melina Schools, MD;  Location: [place];  Service: Orthopedics;  Laterality: N/A;    There were no vitals taken for this visit.  Visit Diagnosis:  Bilateral low back pain with left-sided sciatica  Weakness of left hip  Difficulty  walking  Fusion of lumbar spine      Subjective Assessment - 09/08/14 0943    Symptoms Patient styaes improved opain and patient feels that she will be ready to return to work sooon.    Currently in Pain? No/denies   Pain Score 0-No pain             [facility] Adult PT Treatment/Exercise - 09/08/14 0001    Lumbar Exercises: Stretches   Active Hamstring Stretch 3 reps;30 seconds   Passive Hamstring Stretch 2 reps;30 seconds   Passive Hamstring Stretch Limitations Gastroc Stretch, Slantboard   Hip Flexor Stretch 3 reps;20 seconds   Hip Flexor Stretch Limitations 14in step, also groin stretch 3x 20sec   Quad Stretch 3 reps;30 seconds   Quad Stretch Limitations Prone with rope   Piriformis Stretch 3 reps;30 seconds   Piriformis Stretch Limitations supine figure 4 with towel   Lumbar Exercises: Standing   Forward Lunge 10 reps   Forward Lunge Limitations to 6"    Row 10 reps;Strengthening   Other Standing Lumbar Exercises 3D Hip Excursion x15   Other Standing Lumbar Exercises Sumo walk 25ft 2 RT RTB   Lumbar Exercises: Supine   Bridge 15 reps   Lumbar Exercises: Prone   Straight Leg Raise 10 reps  PT Education - 09/08/14 1021    Education Details Sumo walk with red Tband given as part of HEP.    Person(s) Educated Patient   Methods Explanation;Demonstration   Comprehension Verbalized understanding;Returned demonstration          PT Short Term Goals - 09/02/14 1004    PT SHORT TERM GOAL #1   Title Patient will demsontrate increased hip internal rotation bilaterally to 30 degrees to improve deceleration mechanics of gait   Status On-going   PT SHORT TERM GOAL #2   Title Patient will demonstrate increased hamstring strength to 4/5 MMT bilaterally to increased hip and knee stability to gait to decrease strain on lumbar spine.    Status On-going   PT SHORT TERM GOAL #3   Title Patient will demonstrate increased quadriceps flexibility bilaterally to 125 degrees  or greater to increase gait efficiency.    Status On-going   PT SHORT TERM GOAL #4   Title Patient will dmeonstrate increased Glut med/max strength to 3/5 MMT bilaterally.    Status On-going           PT Long Term Goals - 09/02/14 1005    PT LONG TERM GOAL #1   Title Patient will be bale to walk >8minutes withotu complain of pain.    Status On-going   PT LONG TERM GOAL #2   Title Patient will demonstrate increased hamstring strength of 5/5 MMT to improve ability to go up and down stairs withtout hand rail assistance.    PT LONG TERM GOAL #3   Title Patient will demosntrate increased abdominal strength to 4/5 MMT by demosntrateing bilateral SLR to indicate improved trunk stability.    PT LONG TERM GOAL #4   Title Patient will demsontrate independence with HEP.    Status On-going               Plan - 09/08/14 1021    Clinical Impression Statement Patient again late for apporintment. Patient statesd that she has started to wean off the brace and has been able to begin performing light to medium intensity activities in the home such as mopping. Patient was able to mop her home withonly minor increase in pain. this session focus conitnued to be placed on improving him mobility to decrease rotational demand on lumbar spine. Patient also began rows and lunges at low intensity levels to promote adaptation to increase improving strength.  no pain noted at end of session.    PT Next Visit Plan continue mobility, stretches and progress strength.  Begin side lunges, increase row resistance to green band.    Consulted and Agree with Plan of Care Patient        Problem List Patient Active Problem List   Diagnosis Date Noted  . Lumbar pseudoarthrosis 07/14/2014  . Chronic low back pain 12/08/2013  . Nasal congestion 12/08/2013  . DM (diabetes mellitus) type 2, uncontrolled, with ketoacidosis 11/25/2013  . HTN (hypertension) 11/25/2013  . Headache(784.0) 11/25/2013  . Hyperkalemia  04/13/2012  . Anemia 04/13/2012  . Depression 04/13/2012  . Diabetic neuropathy 04/13/2012  . Constipation 02/16/2012  . Pharyngitis 05/02/2011  . Uterine bleeding 05/02/2011  . Diabetes mellitus high risk 05/02/2011  . Hypokalemia 05/02/2011    Devona Konig PT DPT [place], [name], [address] Phone: [phone]   Fax:  [phone]

## 2014-09-09 ENCOUNTER — Encounter: Payer: Self-pay | Admitting: Dietician

## 2014-09-09 ENCOUNTER — Encounter: Payer: BLUE CROSS/BLUE SHIELD | Attending: Internal Medicine | Admitting: Dietician

## 2014-09-09 VITALS — Ht 65.0 in | Wt 195.0 lb

## 2014-09-09 DIAGNOSIS — Z713 Dietary counseling and surveillance: Secondary | ICD-10-CM | POA: Diagnosis not present

## 2014-09-09 DIAGNOSIS — Z9189 Other specified personal risk factors, not elsewhere classified: Secondary | ICD-10-CM | POA: Insufficient documentation

## 2014-09-09 NOTE — Patient Instructions (Signed)
Plan:  Aim for 3 Carb Choices per meal (45 grams) +/- 1 either way  Aim for 0-2 Carbs per snack if hungry (No more than 30 grams) Include protein in moderation with your meals and snacks Consider reading food labels for Total Carbohydrate and Fat Grams of foods Continue to be consistent with your walking and exercise.  Physical Therapist is a good resource for you right now. Consider checking BG at alternate times per day as directed by MD  Continue taking medication  as directed by MD  Consider getting Calorie [employer] app for i Pad.

## 2014-09-09 NOTE — Progress Notes (Signed)
Medical Nutrition Therapy:  Appt start time: 0830 end time:  1000.   Assessment:  Primary concerns today: Patient wants to review carbohydrate counting to better improve her blood sugar control.  Granddaughter was diagnosed with type 1-2 diabetes in January and she wants to set a good example.  Patient is very emotional about DM and how it has effected her and family and her fears.  Patient is here alone.  She lives with son, daughter, granddaughter (with DM) and grandson .  Much DM in family, 2 brothers, daughter, 2 granddaughters.  Patient has had 2 back surgeries (04/2013 and 07/14/14).  She has applied for disability.  She is a [employer].  She checks her blood sugars 7 times per day.  Before and after meal , and before bed as well as when she has a low.  Low in office of 63 this am, increased to 139 15 minutes after treatment with 15 grams of CHO.  Only ate Canteloupe for breakfast.  Usual CBG's 100-200 improved form 300-400.    HgbA1C of 10.2% 07/14/14.  Results for [name], [name] (MRN [id number]) as of 09/09/2014 10:06  Ref. Range 07/20/2014 15:06  Cholesterol Latest Range: 0-200 mg/dL 231 (H)  Triglycerides Latest Range: <150 mg/dL 136  HDL Latest Range: >39 mg/dL 50  LDL (calc) Latest Range: 0-99 mg/dL 154 (H)  VLDL Latest Range: 0-40 mg/dL 27  Total CHOL/HDL Ratio Latest Units: Ratio 4.6     Preferred Learning Style:   No preference indicated   Learning Readiness:   Ready  Change in progress  MEDICATIONS: see list to include:  Lantus, Novolog, and saxagliptin-metfornin   DIETARY INTAKE: Usual eating pattern includes 3 meals and 2-3 snacks per day. Avoided foods include not much red meat.  She states that she does not fry except fish..    24-hr recall:  B ( AM): fruit and coffee or bacon, eggs and 1 waffle with sugar free syrup and country crock margarine or toast with jam and margarine or honey nut cheerios or special K with soy milk and juice, or oatmeal Snk ( AM):  fresh fruit or smoothie made with frozen fruit and juice L ( PM): sandwich on thin bagel with [place] or beef salami and mayo or salad with vinegret dressing, fruit or cookies Snk ( PM): combos or extra butter popcorn D ( PM):  Meat, starch, vegetable, fruit Snk ( PM): combos, chocolate fudge cookies (she is reading the labels) Beverages: water, coffee black or with vanilla cream, occasional soda  Usual physical activity: Walking with granddaughter 15-20 minutes daily,  Patient is currently in Physical Therapy  Estimated energy needs: 1600 calories 180 g carbohydrates 100 g protein 53 g fat  Progress Towards Goal(s):  In progress.   Nutritional Diagnosis:  NB-1.1 Food and nutrition-related knowledge deficit As related to balance of CHO, protein and fat.  As evidenced by diet hx and patient report..    Intervention:  Nutrition counseling and diabetes education initiated. Discussed Carb Counting by food group as method of portion control, reading food labels, and benefits of increased activity. Also discussed basic physiology of Diabetes, target BG ranges pre and post meals, and A1c.  . Plan:  Aim for 3 Carb Choices per meal (45 grams) +/- 1 either way  Aim for 0-2 Carbs per snack if hungry (No more than 30 grams) Include protein in moderation with your meals and snacks Consider reading food labels for Total Carbohydrate and Fat Grams of foods Continue to be  consistent with your walking and exercise.  Physical Therapist is a good resource for you right now. Consider checking BG at alternate times per day as directed by MD  Continue taking medication  as directed by MD Consider getting Calorie [employer] app for i Pad.  Teaching Method Utilized:  Visual Auditory  Handouts given during visit include:  My plate  Planning healthy meals  Meal card  Label reading  Snack list  Barriers to learning/adherence to lifestyle change: none  Demonstrated degree of understanding via:  Teach  Back   Monitoring/Evaluation:  Dietary intake, exercise, label reading, and body weight in 1 month(s).

## 2014-09-10 ENCOUNTER — Ambulatory Visit (HOSPITAL_COMMUNITY): Payer: BLUE CROSS/BLUE SHIELD

## 2014-09-10 DIAGNOSIS — M4326 Fusion of spine, lumbar region: Secondary | ICD-10-CM

## 2014-09-10 DIAGNOSIS — M5442 Lumbago with sciatica, left side: Secondary | ICD-10-CM

## 2014-09-10 DIAGNOSIS — R29898 Other symptoms and signs involving the musculoskeletal system: Secondary | ICD-10-CM

## 2014-09-10 DIAGNOSIS — M545 Low back pain: Secondary | ICD-10-CM | POA: Diagnosis not present

## 2014-09-10 DIAGNOSIS — R262 Difficulty in walking, not elsewhere classified: Secondary | ICD-10-CM

## 2014-09-10 NOTE — Therapy (Signed)
[name], [name], [address] Phone: [phone]   Fax:  [phone]  Physical Therapy Treatment  Patient Details  Name: [name] MRN: [id number] Date of Birth: Mar 14, 1961 Referring Provider:  Melina Schools, MD  Encounter Date: 09/10/2014      PT End of Session - 09/10/14 0951    Visit Number 5   Number of Visits 16   Date for PT Re-Evaluation 09/24/14   Authorization Type BCBS   Authorization - Visit Number 5   Authorization - Number of Visits 16   PT Start Time [phone]   PT Stop Time 1020   PT Time Calculation (min) 42 min   Activity Tolerance Patient tolerated treatment well   Behavior During Therapy [facility] for tasks assessed/performed      Past Medical History  Diagnosis Date  . Diabetes mellitus     h/o dka  . Depression   . Constipation   . Fibromyalgia   . Anemia   . Anxiety   . Heart murmur     child  . Hypertension   . GERD (gastroesophageal reflux disease)     tums occ  . Arthritis     Past Surgical History  Procedure Laterality Date  . Knee surgery Right 99    screws  . Cystectomy  96    stomach  . Shoulder surgery  10    right rotator cuff  . Endometrial ablation  08  . Colonoscopy  02/26/2012    SLF: Internal hemorrhoids/TCS IN 10 YEARS WITH PROPOFOL  . Lumbar laminectomy/decompression microdiscectomy N/A 05/14/2013    Procedure: RIGHT L4-L5 DECOMPRESSION AND CYST REMOVAL;  Surgeon: Melina Schools, MD;  Location: [place];  Service: Orthopedics;  Laterality: N/A;  . Lumbar percutaneous pedicle screw 1 level N/A 07/14/2014    Procedure: REMOVAL OF L5-S1 PEDICLE SCREWS,FUSION L5-S1, REINSERTION OF PEDICLE SCREWS ILIAC CREST BONE GRAFT;  Surgeon: Melina Schools, MD;  Location: [place];  Service: Orthopedics;  Laterality: N/A;    There were no vitals taken for this visit.  Visit Diagnosis:  Bilateral low back pain with left-sided sciatica  Weakness of left hip  Difficulty  walking  Fusion of lumbar spine      Subjective Assessment - 09/10/14 0946    Symptoms Pain scale 3/10 in center of back, most often when walking for prolonger period of time or sitting   Pain Score 3    Pain Location Back   Pain Orientation Lower   Pain Descriptors / Indicators Aching          [facility] PT Assessment - 09/10/14 0001    Assessment   Medical Diagnosis Low back pain s/p revision of fusion.    Onset Date 07/14/14   Next MD Visit Melina Schools, 3/4/16Veverly Fells 09/13/14 for knees   Prior Therapy no   Precautions   Precautions Back                  [facility] Adult PT Treatment/Exercise - 09/10/14 1009    Exercises   Exercises Lumbar   Lumbar Exercises: Stretches   Active Hamstring Stretch 3 reps;30 seconds   Active Hamstring Stretch Limitations 3 directions on 14in step   Hip Flexor Stretch 3 reps;20 seconds   Hip Flexor Stretch Limitations 14in step, also groin stretch 3x 20sec   Quad Stretch 3 reps;30 seconds   Quad Stretch Limitations Prone with rope   Piriformis Stretch 3 reps;30 seconds   Piriformis Stretch Limitations  supine figure 4 with towel   Lumbar Exercises: Standing   Forward Lunge 10 reps   Forward Lunge Limitations to 6"    Side Lunge 10 reps   Side Lunge Limitations 6in step   Row 10 reps;Strengthening   Theraband Level (Row) Level 3 (Green)   Row Limitations Split stance squat, unilateral row with 2# punch 2x 10   Other Standing Lumbar Exercises 3D Hip Excursion x15   Lumbar Exercises: Supine   Ab Set 5 reps;3 seconds   AB Set Limitations PFC   Bridge 10 reps   Manual Therapy   Manual Therapy Other (comment)   Other Manual Therapy SI alignement assessed, no muscle energy technique                PT Education - 09/10/14 1052    Education provided Yes   Education Details Importance of core strengthening, instructed pelvic floor contraction   Person(s) Educated Patient   Methods Explanation;Demonstration;Tactile  cues;Verbal cues   Comprehension Verbalized understanding;Returned demonstration;Verbal cues required;Tactile cues required          PT Short Term Goals - 09/10/14 1051    PT SHORT TERM GOAL #1   Title Patient will demsontrate increased hip internal rotation bilaterally to 30 degrees to improve deceleration mechanics of gait   Status On-going   PT SHORT TERM GOAL #2   Title Patient will demonstrate increased hamstring strength to 4/5 MMT bilaterally to increased hip and knee stability to gait to decrease strain on lumbar spine.    Status On-going   PT SHORT TERM GOAL #3   Title Patient will demonstrate increased quadriceps flexibility bilaterally to 125 degrees or greater to increase gait efficiency.    Status On-going   PT SHORT TERM GOAL #4   Title Patient will dmeonstrate increased Glut med/max strength to 3/5 MMT bilaterally.    Status On-going           PT Long Term Goals - 09/10/14 1051    PT LONG TERM GOAL #1   Title Patient will be bale to walk >75mnutes withotu complain of pain.    PT LONG TERM GOAL #2   Title Patient will demonstrate increased hamstring strength of 5/5 MMT to improve ability to go up and down stairs withtout hand rail assistance.    PT LONG TERM GOAL #3   Title Patient will demosntrate increased abdominal strength to 4/5 MMT by demosntrateing bilateral SLR to indicate improved trunk stability.    PT LONG TERM GOAL #4   Title Patient will demsontrate independence with HEP.                Plan - 09/10/14 1009    Clinical Impression Statement Pt stated she feels her groin is out of alignment, reports relief with IR movements, checked SI alingment with Lt SI inflare.  Muscle energy technique with improved IR and ER Bil.  Pt still limted by pain this session.  Continued with stretches and excursion exercises to improve hip mobility and progressed to green tband with row activities to improve rotation in transverse rotation.  Instructed pelciv floor  contraction to improve core strengthening to assist with SI alignment   PT Next Visit Plan Continue mobility exercises, stretches and progress strength.  Check SI alignment and MET as needed.          Problem List Patient Active Problem List   Diagnosis Date Noted  . Lumbar pseudoarthrosis 07/14/2014  . Chronic low back pain 12/08/2013  .  Nasal congestion 12/08/2013  . DM (diabetes mellitus) type 2, uncontrolled, with ketoacidosis 11/25/2013  . HTN (hypertension) 11/25/2013  . Headache(784.0) 11/25/2013  . Hyperkalemia 04/13/2012  . Anemia 04/13/2012  . Depression 04/13/2012  . Diabetic neuropathy 04/13/2012  . Constipation 02/16/2012  . Pharyngitis 05/02/2011  . Uterine bleeding 05/02/2011  . Diabetes mellitus high risk 05/02/2011  . Hypokalemia 05/02/2011   [name], [name]  Aldona Lento 09/10/2014, 10:57 AM  [place], [name], [address] Phone: [phone]   Fax:  [phone]

## 2014-09-13 ENCOUNTER — Ambulatory Visit (HOSPITAL_COMMUNITY): Payer: BLUE CROSS/BLUE SHIELD | Admitting: Physical Therapy

## 2014-09-13 ENCOUNTER — Other Ambulatory Visit: Payer: Self-pay | Admitting: *Deleted

## 2014-09-13 DIAGNOSIS — R262 Difficulty in walking, not elsewhere classified: Secondary | ICD-10-CM

## 2014-09-13 DIAGNOSIS — M545 Low back pain: Secondary | ICD-10-CM | POA: Diagnosis not present

## 2014-09-13 DIAGNOSIS — M5442 Lumbago with sciatica, left side: Secondary | ICD-10-CM

## 2014-09-13 DIAGNOSIS — M4326 Fusion of spine, lumbar region: Secondary | ICD-10-CM

## 2014-09-13 DIAGNOSIS — E139 Other specified diabetes mellitus without complications: Secondary | ICD-10-CM

## 2014-09-13 DIAGNOSIS — R29898 Other symptoms and signs involving the musculoskeletal system: Secondary | ICD-10-CM

## 2014-09-13 MED ORDER — SITAGLIPTIN PHOS-METFORMIN HCL 50-1000 MG PO TABS: 1.0000 | ORAL_TABLET | Freq: Two times a day (BID) | ORAL | Status: DC

## 2014-09-13 NOTE — Therapy (Signed)
[place], [name], [address] Phone: [phone]   Fax:  [phone]  Physical Therapy Treatment  Patient Details  Name: [name] MRN: [id number] Date of Birth: [birth year] Referring Provider:  Melina Schools, MD  Encounter Date: 09/13/2014      PT End of Session - 09/13/14 0948    Visit Number 6   Number of Visits 16   Date for PT Re-Evaluation 09/24/14   Authorization Type BCBS   Authorization - Visit Number 6   Authorization - Number of Visits 16   PT Start Time [phone]   PT Stop Time 1015   PT Time Calculation (min) 38 min   Activity Tolerance Patient tolerated treatment well   Behavior During Therapy [facility] for tasks assessed/performed      Past Medical History  Diagnosis Date  . Diabetes mellitus     h/o dka  . Depression   . Constipation   . Fibromyalgia   . Anemia   . Anxiety   . Heart murmur     child  . Hypertension   . GERD (gastroesophageal reflux disease)     tums occ  . Arthritis     Past Surgical History  Procedure Laterality Date  . Knee surgery Right 99    screws  . Cystectomy  96    stomach  . Shoulder surgery  10    right rotator cuff  . Endometrial ablation  08  . Colonoscopy  02/26/2012    SLF: Internal hemorrhoids/TCS IN 10 YEARS WITH PROPOFOL  . Lumbar laminectomy/decompression microdiscectomy N/A 05/14/2013    Procedure: RIGHT L4-L5 DECOMPRESSION AND CYST REMOVAL;  Surgeon: Melina Schools, MD;  Location: [place];  Service: Orthopedics;  Laterality: N/A;  . Lumbar percutaneous pedicle screw 1 level N/A 07/14/2014    Procedure: REMOVAL OF L5-S1 PEDICLE SCREWS,FUSION L5-S1, REINSERTION OF PEDICLE SCREWS ILIAC CREST BONE GRAFT;  Surgeon: Melina Schools, MD;  Location: [place];  Service: Orthopedics;  Laterality: N/A;    There were no vitals taken for this visit.  Visit Diagnosis:  Bilateral low back pain with left-sided sciatica  Weakness of left hip  Difficulty  walking  Fusion of lumbar spine      Subjective Assessment - 09/13/14 0939    Symptoms Patient states feeling better with only minimal discomfort, not pain in Rt low back   Currently in Pain? No/denies   Pain Score 0-No pain   Pain Location Back   Pain Orientation Right;Lower   Pain Descriptors / Indicators Sore   Pain Type Chronic pain         [facility] Adult PT Treatment/Exercise - 09/13/14 0001    Lumbar Exercises: Stretches   Active Hamstring Stretch 30 seconds;1 rep   Active Hamstring Stretch Limitations 3 directions on 14in step   Hip Flexor Stretch 20 seconds;2 reps   Hip Flexor Stretch Limitations 14in step 5x 3 seconds, second set with over head reach , also groin stretch 3x 20sec   Quad Stretch 30 seconds;2 reps   Quad Stretch Limitations Prone with rope   Piriformis Stretch 30 seconds;2 reps   Piriformis Stretch Limitations supine figure 4 with towel   Lumbar Exercises: Standing   Forward Lunge 10 reps   Forward Lunge Limitations to 6"    Side Lunge 10 reps   Side Lunge Limitations 6in step   Row 10 reps;Strengthening   Theraband Level (Row) Level 3 (Green)   Other Standing Lumbar Exercises 3D Hip  Excursion split stance x15   Other Standing Lumbar Exercises Sumo walk 16ft 2 RT RTB   Lumbar Exercises: Supine   Bent Knee Raise 10 reps   Bent Knee Raise Limitations both side, second set Bilateral bent knee raise   Bridge 10 reps   Manual Therapy   Other Manual Therapy Rt hip internal rotation mobilizations grade 2 and3 for decreasing groin pain and improving Rt hip internal rotation.           PT Short Term Goals - 09/10/14 1051    PT SHORT TERM GOAL #1   Title Patient will demsontrate increased hip internal rotation bilaterally to 30 degrees to improve deceleration mechanics of gait   Status On-going   PT SHORT TERM GOAL #2   Title Patient will demonstrate increased hamstring strength to 4/5 MMT bilaterally to increased hip and knee stability to gait to  decrease strain on lumbar spine.    Status On-going   PT SHORT TERM GOAL #3   Title Patient will demonstrate increased quadriceps flexibility bilaterally to 125 degrees or greater to increase gait efficiency.    Status On-going   PT SHORT TERM GOAL #4   Title Patient will dmeonstrate increased Glut med/max strength to 3/5 MMT bilaterally.    Status On-going           PT Long Term Goals - 09/10/14 1051    PT LONG TERM GOAL #1   Title Patient will be bale to walk >10minutes withotu complain of pain.    PT LONG TERM GOAL #2   Title Patient will demonstrate increased hamstring strength of 5/5 MMT to improve ability to go up and down stairs withtout hand rail assistance.    PT LONG TERM GOAL #3   Title Patient will demosntrate increased abdominal strength to 4/5 MMT by demosntrateing bilateral SLR to indicate improved trunk stability.    PT LONG TERM GOAL #4   Title Patient will demsontrate independence with HEP.                Plan - 09/18/2014 1009    Clinical Impression Statement Patient's low back pain and soreness attributed to limited rectus femoris mobility and limited hip mobility secondary to joint stiffness resulting in abnormal gait and loading forces. Following hip internal rotation mobilizations patient's hip pain improved. Following rectus femoris stretching and abdominal strerngtheing patient had decreased low back pain.    PT Next Visit Plan Continue rectus femoris mobility exercises and abdominal strengthening. Focus to be on increasing glut and abdominal strength for improved hip and trunk stability.           G-Codes - 09/18/14 0825    Functional Assessment Tool Used Clinical judgement   Mobility: Walking and Moving Around Current Status [phone]) At least 80 percent but less than 100 percent impaired, limited or restricted   Mobility: Walking and Moving Around Goal Status [phone]) At least 60 percent but less than 80 percent impaired, limited or restricted       Problem List Patient Active Problem List   Diagnosis Date Noted  . Lumbar pseudoarthrosis 07/14/2014  . Chronic low back pain 12/08/2013  . Nasal congestion 12/08/2013  . DM (diabetes mellitus) type 2, uncontrolled, with ketoacidosis 11/25/2013  . HTN (hypertension) 11/25/2013  . Headache(784.0) 11/25/2013  . Hyperkalemia 04/13/2012  . Anemia 04/13/2012  . Depression 04/13/2012  . Diabetic neuropathy 04/13/2012  . Constipation 02/16/2012  . Pharyngitis 05/02/2011  . Uterine bleeding 05/02/2011  . Diabetes mellitus high  risk 05/02/2011  . Hypokalemia 05/02/2011   Devona Konig PT DPT [place], [name], [address] Phone: [phone]   Fax:  [phone]

## 2014-09-17 ENCOUNTER — Ambulatory Visit (HOSPITAL_COMMUNITY): Payer: BLUE CROSS/BLUE SHIELD

## 2014-09-21 ENCOUNTER — Other Ambulatory Visit: Payer: Self-pay | Admitting: *Deleted

## 2014-09-21 ENCOUNTER — Ambulatory Visit (HOSPITAL_COMMUNITY): Payer: BLUE CROSS/BLUE SHIELD | Attending: Orthopedic Surgery | Admitting: Physical Therapy

## 2014-09-21 DIAGNOSIS — R29898 Other symptoms and signs involving the musculoskeletal system: Secondary | ICD-10-CM

## 2014-09-21 DIAGNOSIS — R531 Weakness: Secondary | ICD-10-CM | POA: Diagnosis not present

## 2014-09-21 DIAGNOSIS — M545 Low back pain: Secondary | ICD-10-CM | POA: Diagnosis not present

## 2014-09-21 DIAGNOSIS — Z981 Arthrodesis status: Secondary | ICD-10-CM | POA: Insufficient documentation

## 2014-09-21 DIAGNOSIS — M25552 Pain in left hip: Secondary | ICD-10-CM | POA: Insufficient documentation

## 2014-09-21 DIAGNOSIS — M5442 Lumbago with sciatica, left side: Secondary | ICD-10-CM

## 2014-09-21 DIAGNOSIS — R262 Difficulty in walking, not elsewhere classified: Secondary | ICD-10-CM | POA: Diagnosis not present

## 2014-09-21 DIAGNOSIS — M4326 Fusion of spine, lumbar region: Secondary | ICD-10-CM

## 2014-09-21 MED ORDER — SAXAGLIPTIN-METFORMIN ER 2.5-1000 MG PO TB24: 2.0000 | ORAL_TABLET | ORAL | Status: DC

## 2014-09-21 NOTE — Therapy (Signed)
[place], [name], [address] Phone: [phone]   Fax:  [phone]  Physical Therapy Treatment  Patient Details  Name: [name] MRN: [id number] Date of Birth: April 04, 1961 Referring Provider:  Melina Schools, MD  Encounter Date: 09/21/2014      PT End of Session - 09/21/14 1012    Visit Number 7   Number of Visits 16   Date for PT Re-Evaluation 09/24/14   Authorization Type BCBS   Authorization - Visit Number 7   Authorization - Number of Visits 16   PT Start Time [phone]   PT Stop Time 1015   PT Time Calculation (min) 32 min   Activity Tolerance Patient tolerated treatment well   Behavior During Therapy [facility] for tasks assessed/performed      Past Medical History  Diagnosis Date  . Diabetes mellitus     h/o dka  . Depression   . Constipation   . Fibromyalgia   . Anemia   . Anxiety   . Heart murmur     child  . Hypertension   . GERD (gastroesophageal reflux disease)     tums occ  . Arthritis     Past Surgical History  Procedure Laterality Date  . Knee surgery Right 99    screws  . Cystectomy  96    stomach  . Shoulder surgery  10    right rotator cuff  . Endometrial ablation  08  . Colonoscopy  02/26/2012    SLF: Internal hemorrhoids/TCS IN 10 YEARS WITH PROPOFOL  . Lumbar laminectomy/decompression microdiscectomy N/A 05/14/2013    Procedure: RIGHT L4-L5 DECOMPRESSION AND CYST REMOVAL;  Surgeon: Melina Schools, MD;  Location: [place];  Service: Orthopedics;  Laterality: N/A;  . Lumbar percutaneous pedicle screw 1 level N/A 07/14/2014    Procedure: REMOVAL OF L5-S1 PEDICLE SCREWS,FUSION L5-S1, REINSERTION OF PEDICLE SCREWS ILIAC CREST BONE GRAFT;  Surgeon: Melina Schools, MD;  Location: [place];  Service: Orthopedics;  Laterality: N/A;    There were no vitals taken for this visit.  Visit Diagnosis:  Bilateral low back pain with left-sided sciatica  Weakness of left hip  Difficulty  walking  Fusion of lumbar spine      Subjective Assessment - 09/21/14 0956    Symptoms Patient  states doign more work in home including sweeping and cleaning that has resulted in increased soreness and pain though patient notes she is feeling better overall.    Currently in Pain? Yes   Pain Score 3    Pain Location Back            [facility] Adult PT Treatment/Exercise - 09/21/14 0001    Lumbar Exercises: Stretches   Hip Flexor Stretch Limitations 10x 3" to 14" box with overhead reach   Quad Stretch Limitations prone thomas stretch 3x 20 seconds   Piriformis Stretch Limitations supine figure 4 with towel 10x 3 seconds   Lumbar Exercises: Standing   Lifting Limitations Ovwerhead dumbbell matrix 10x each with 3lb dumbbeels.    Forward Lunge 10 reps   Forward Lunge Limitations to floor with overhead reach   Side Lunge 10 reps   Side Lunge Limitations 6in step   Row 10 reps   Theraband Level (Row) Level 4 (Blue)   Row Limitations Split stance squat, unilateral row with 2# punch 2x 10 (4way)   Other Standing Lumbar Exercises 3D Hip Excursion split stance x10   Other Standing Lumbar Exercises Sumo walk 42ft 2  RT RTB cued for depth.    Lumbar Exercises: Supine   Bent Knee Raise 10 reps   Bent Knee Raise Limitations both side, second set Bilateral bent knee raise   Bridge 10 reps   Other Supine Lumbar Exercises Bent kne trunk rotation in hooklaying 10x each    Other Supine Lumbar Exercises Bicycle 10x each   Manual Therapy   Other Manual Therapy Rt hip internal rotation mobilizations grade 2 and3 for decreasing groin pain and improving Rt hip internal rotation.            PT Short Term Goals - 09/10/14 1051    PT SHORT TERM GOAL #1   Title Patient will demsontrate increased hip internal rotation bilaterally to 30 degrees to improve deceleration mechanics of gait   Status On-going   PT SHORT TERM GOAL #2   Title Patient will demonstrate increased hamstring strength to 4/5 MMT  bilaterally to increased hip and knee stability to gait to decrease strain on lumbar spine.    Status On-going   PT SHORT TERM GOAL #3   Title Patient will demonstrate increased quadriceps flexibility bilaterally to 125 degrees or greater to increase gait efficiency.    Status On-going   PT SHORT TERM GOAL #4   Title Patient will dmeonstrate increased Glut med/max strength to 3/5 MMT bilaterally.    Status On-going           PT Long Term Goals - 09/10/14 1051    PT LONG TERM GOAL #1   Title Patient will be bale to walk >72minutes withotu complain of pain.    PT LONG TERM GOAL #2   Title Patient will demonstrate increased hamstring strength of 5/5 MMT to improve ability to go up and down stairs withtout hand rail assistance.    PT LONG TERM GOAL #3   Title Patient will demosntrate increased abdominal strength to 4/5 MMT by demosntrateing bilateral SLR to indicate improved trunk stability.    PT LONG TERM GOAL #4   Title Patient will demsontrate independence with HEP.             Plan - 09/21/14 1013    Clinical Impression Statement Patient arrived 13 minutes late for session. Patient noted increased pain/soreness in low back trthis session though pain wimproved with exercise and LE stretches. patient dmeosntrated good tolerance for new strengtheing exercises. Patient continues to dmeosntrate signidificant trunk weakness/instability secondary to decreraased abdominal strength.    PT Next Visit Plan Continue rectus femoris mobility exercises and abdominal strengthening. Focus to be on increasing glut and abdominal strength for improved hip and trunk stability. Progress split stance hip excursions to a 4" box.         Problem List Patient Active Problem List   Diagnosis Date Noted  . Lumbar pseudoarthrosis 07/14/2014  . Chronic low back pain 12/08/2013  . Nasal congestion 12/08/2013  . DM (diabetes mellitus) type 2, uncontrolled, with ketoacidosis 11/25/2013  . HTN  (hypertension) 11/25/2013  . Headache(784.0) 11/25/2013  . Hyperkalemia 04/13/2012  . Anemia 04/13/2012  . Depression 04/13/2012  . Diabetic neuropathy 04/13/2012  . Constipation 02/16/2012  . Pharyngitis 05/02/2011  . Uterine bleeding 05/02/2011  . Diabetes mellitus high risk 05/02/2011  . Hypokalemia 05/02/2011   Devona Konig PT DPT [place], [name], [address] Phone: [phone]   Fax:  [phone]

## 2014-09-23 ENCOUNTER — Ambulatory Visit (HOSPITAL_COMMUNITY): Payer: BLUE CROSS/BLUE SHIELD

## 2014-09-23 DIAGNOSIS — M5442 Lumbago with sciatica, left side: Secondary | ICD-10-CM

## 2014-09-23 DIAGNOSIS — M4326 Fusion of spine, lumbar region: Secondary | ICD-10-CM

## 2014-09-23 DIAGNOSIS — R29898 Other symptoms and signs involving the musculoskeletal system: Secondary | ICD-10-CM

## 2014-09-23 DIAGNOSIS — R262 Difficulty in walking, not elsewhere classified: Secondary | ICD-10-CM

## 2014-09-23 DIAGNOSIS — M545 Low back pain: Secondary | ICD-10-CM | POA: Diagnosis not present

## 2014-09-23 NOTE — Therapy (Signed)
[place], [name], [address] Phone: [phone]   Fax:  [phone]  Physical Therapy Treatment  Patient Details  Name: [name] MRN: [id number] Date of Birth: 03/06/1961 Referring Provider:  Melina Schools, MD  Encounter Date: 09/23/2014      PT End of Session - 09/23/14 0953    Visit Number 8   Number of Visits 16   Date for PT Re-Evaluation 10/24/14   Authorization Type BCBS   Authorization - Visit Number 8   Authorization - Number of Visits 16   PT Start Time [phone]   PT Stop Time 1017   PT Time Calculation (min) 41 min   Activity Tolerance Patient tolerated treatment well   Behavior During Therapy [facility] for tasks assessed/performed      Past Medical History  Diagnosis Date  . Diabetes mellitus     h/o dka  . Depression   . Constipation   . Fibromyalgia   . Anemia   . Anxiety   . Heart murmur     child  . Hypertension   . GERD (gastroesophageal reflux disease)     tums occ  . Arthritis     Past Surgical History  Procedure Laterality Date  . Knee surgery Right 99    screws  . Cystectomy  96    stomach  . Shoulder surgery  10    right rotator cuff  . Endometrial ablation  08  . Colonoscopy  02/26/2012    SLF: Internal hemorrhoids/TCS IN 10 YEARS WITH PROPOFOL  . Lumbar laminectomy/decompression microdiscectomy N/A 05/14/2013    Procedure: RIGHT L4-L5 DECOMPRESSION AND CYST REMOVAL;  Surgeon: Melina Schools, MD;  Location: [facility];  Service: Orthopedics;  Laterality: N/A;  . Lumbar percutaneous pedicle screw 1 level N/A 07/14/2014    Procedure: REMOVAL OF L5-S1 PEDICLE SCREWS,FUSION L5-S1, REINSERTION OF PEDICLE SCREWS ILIAC CREST BONE GRAFT;  Surgeon: Melina Schools, MD;  Location: [facility];  Service: Orthopedics;  Laterality: N/A;    There were no vitals taken for this visit.  Visit Diagnosis:  Bilateral low back pain with left-sided sciatica  Weakness of left hip  Difficulty  walking  Fusion of lumbar spine      Subjective Assessment - 09/23/14 0948    Symptoms Pt stated pain scale 3/10 in center of lower back   How long can you sit comfortably? Able to sit for10-15 comfortably (15 minutes at absolute most. )   How long can you stand comfortably? Able to stand 5 minutes at most due to increased pain (unable to to stand still due to discomfort. )   How long can you walk comfortably? Unable to walk 5 minutes due to increased pain   Currently in Pain? Yes   Pain Score 3    Pain Location Back   Pain Orientation Lower          [facility] PT Assessment - 09/23/14 0001    Assessment   Medical Diagnosis Low back pain s/p revision of fusion.    Onset Date 07/14/14   Next MD Visit Melina Schools, 10/05/14   Prior Therapy no   Precautions   Precautions Back   ROM / Strength   AROM / PROM / Strength AROM   AROM   AROM Assessment Site Hip;Knee   Right/Left Hip Right;Left   Right Hip External Rotation  58  was 50   Right Hip Internal Rotation  25  was 17   Left Hip  External Rotation  50  was 42   Left Hip Internal Rotation  25  was 22   Right/Left Knee Right;Left   Right/Left Ankle Right;Left   Strength   Strength Assessment Site Hip;Knee   Right/Left Hip Right;Left   Right Hip Flexion 5/5   Right Hip Extension 3+/5  was 3+/5   Right Hip ABduction 3+/5  was 2/5   Left Hip Flexion 5/5  was 4+/5   Left Hip Extension 3+/5  was 3+/5   Left Hip ABduction 3-/5  was 2/5   Right Knee Flexion 4+/5  was 4+/5   Right Knee Extension 5/5   Left Knee Flexion 4/5  was 4-/5   Left Knee Extension 5/5   Right/Left Ankle Right;Left   Right Ankle Dorsiflexion 5/5   Left Ankle Dorsiflexion 5/5   Flexibility   Quadriceps Rt: 105 degrees.  Rt: 95 degrees.                  [facility] Adult PT Treatment/Exercise - 09/23/14 0001    Exercises   Exercises Lumbar   Lumbar Exercises: Stretches   Active Hamstring Stretch 3 reps;30 seconds   Active Hamstring  Stretch Limitations 3 directions on 14in step   Hip Flexor Stretch 2 reps;20 seconds   Hip Flexor Stretch Limitations 10x 3" to 14" box with overhead reach   Quad Stretch 3 reps;30 seconds   Quad Stretch Limitations prone thomas stretch 3x 20 seconds   Piriformis Stretch 30 seconds;2 reps   Piriformis Stretch Limitations supine figure 4 with towel    Lumbar Exercises: Standing   Other Standing Lumbar Exercises 3D Hip Excursion split stance with 4in step   Other Standing Lumbar Exercises Sumo walk 21f 2 RT RTB cued for depth.    Lumbar Exercises: Supine   Bridge 10 reps  2 sets with arms across chest   Other Supine Lumbar Exercises Bicycle 2 sets x 10 each                  PT Short Term Goals - 09/23/14 04174   PT SHORT TERM GOAL #1   Title Patient will demsontrate increased hip internal rotation bilaterally to 30 degrees to improve deceleration mechanics of gait   Baseline 09/23/2014 Lt 25 degrees, Rt 25 degees   Status On-going   PT SHORT TERM GOAL #2   Title Patient will demonstrate increased hamstring strength to 4/5 MMT bilaterally to increased hip and knee stability to gait to decrease strain on lumbar spine.    Baseline 09/23/2014 Rt 4+/5, Lt 4/5   Status Achieved   PT SHORT TERM GOAL #3   Title Patient will demonstrate increased quadriceps flexibility bilaterally to 125 degrees or greater to increase gait efficiency.    Baseline 09/23/2014 Rt quad 105 degrees   PT SHORT TERM GOAL #4   Title Patient will dmeonstrate increased Glut med/max strength to 3/5 MMT bilaterally.    Baseline 09/23/2014 Bil 3+/5   Status Achieved           PT Long Term Goals - 09/23/14 00814   PT LONG TERM GOAL #1   Title Patient will be able to walk >327mutes withotu complain of pain.    Baseline Unable to walk greater than 5 minutes due to increased pain   Status Not Met   PT LONG TERM GOAL #2   Title Patient will demonstrate increased hamstring strength of 5/5 MMT to improve  ability to go up and down stairs  withtout hand rail assistance.    Status On-going   PT LONG TERM GOAL #3   Title Patient will demosntrate increased abdominal strength to 4/5 MMT by demosntrateing bilateral SLR to indicate improved trunk stability.    Status On-going   PT LONG TERM GOAL #4   Title Patient will demsontrate independence with HEP.    Baseline Compliant 2x daily   Status Achieved               Plan - 09/23/14 1538    Clinical Impression Statement Reassessment complete wtih the following findings:  Pt has achieved 2/4 STG and 1/4 LTGs.  Pt reports compliance with HEP 2x daily and able to verbalize or demonstrate appropriate tehcniques with all exercises.  Pt continues to be limited by pain.  Overall hip mobilty is making great gains and strength is progressing.  Pt does continues to demonstrate significant gluteal and trunk weakness and instabiltiy due to decreased abdominal strength.  Pt will continues to benefit from skiled intervention to address goals unmet.     PT Next Visit Plan Recommend continuing OPPT for 4 miore weeks to address goals not achieved.  Continue rectus femoris mobility exercises and abdominal strengthening. Focus to be on increasing glut and abdominal strength for improved hip and trunk stability. Continue with split stance hip excursions to a 4" box to increase hip extension.          Problem List Patient Active Problem List   Diagnosis Date Noted  . Lumbar pseudoarthrosis 07/14/2014  . Chronic low back pain 12/08/2013  . Nasal congestion 12/08/2013  . DM (diabetes mellitus) type 2, uncontrolled, with ketoacidosis 11/25/2013  . HTN (hypertension) 11/25/2013  . Headache(784.0) 11/25/2013  . Hyperkalemia 04/13/2012  . Anemia 04/13/2012  . Depression 04/13/2012  . Diabetic neuropathy 04/13/2012  . Constipation 02/16/2012  . Pharyngitis 05/02/2011  . Uterine bleeding 05/02/2011  . Diabetes mellitus high risk 05/02/2011  . Hypokalemia  05/02/2011   Ihor Austin, [place]  Aldona Lento 09/23/2014, 3:46 PM  [place] [address] [place], [name], [address] Phone: [phone]   Fax:  [phone]     Devona Konig PT DPT [phone]

## 2014-09-28 ENCOUNTER — Ambulatory Visit (HOSPITAL_COMMUNITY): Payer: BLUE CROSS/BLUE SHIELD

## 2014-09-30 ENCOUNTER — Ambulatory Visit (HOSPITAL_COMMUNITY): Payer: BLUE CROSS/BLUE SHIELD | Admitting: Physical Therapy

## 2014-10-01 ENCOUNTER — Ambulatory Visit (HOSPITAL_COMMUNITY): Payer: BLUE CROSS/BLUE SHIELD | Admitting: Physical Therapy

## 2014-10-01 DIAGNOSIS — R262 Difficulty in walking, not elsewhere classified: Secondary | ICD-10-CM

## 2014-10-01 DIAGNOSIS — M545 Low back pain: Secondary | ICD-10-CM | POA: Diagnosis not present

## 2014-10-01 DIAGNOSIS — M5442 Lumbago with sciatica, left side: Secondary | ICD-10-CM

## 2014-10-01 DIAGNOSIS — R29898 Other symptoms and signs involving the musculoskeletal system: Secondary | ICD-10-CM

## 2014-10-01 DIAGNOSIS — M4326 Fusion of spine, lumbar region: Secondary | ICD-10-CM

## 2014-10-01 NOTE — Therapy (Signed)
[place], [name], [address] Phone: [phone]   Fax:  [phone]  Physical Therapy Treatment  Patient Details  Name: [name] MRN: [id number] Date of Birth: 1961-03-16 Referring Provider:  Melina Schools, MD  Encounter Date: 10/01/2014      PT End of Session - 10/01/14 0844    Visit Number 9   Number of Visits 16   Date for PT Re-Evaluation 10/24/14   Authorization Type BCBS   Authorization - Visit Number 9   Authorization - Number of Visits 16   PT Start Time 0800   PT Stop Time 0845   PT Time Calculation (min) 45 min   Activity Tolerance Patient tolerated treatment well   Behavior During Therapy [facility] for tasks assessed/performed      Past Medical History  Diagnosis Date  . Diabetes mellitus     h/o dka  . Depression   . Constipation   . Fibromyalgia   . Anemia   . Anxiety   . Heart murmur     child  . Hypertension   . GERD (gastroesophageal reflux disease)     tums occ  . Arthritis     Past Surgical History  Procedure Laterality Date  . Knee surgery Right 99    screws  . Cystectomy  96    stomach  . Shoulder surgery  10    right rotator cuff  . Endometrial ablation  08  . Colonoscopy  02/26/2012    SLF: Internal hemorrhoids/TCS IN 10 YEARS WITH PROPOFOL  . Lumbar laminectomy/decompression microdiscectomy N/A 05/14/2013    Procedure: RIGHT L4-L5 DECOMPRESSION AND CYST REMOVAL;  Surgeon: Melina Schools, MD;  Location: [facility];  Service: Orthopedics;  Laterality: N/A;  . Lumbar percutaneous pedicle screw 1 level N/A 07/14/2014    Procedure: REMOVAL OF L5-S1 PEDICLE SCREWS,FUSION L5-S1, REINSERTION OF PEDICLE SCREWS ILIAC CREST BONE GRAFT;  Surgeon: Melina Schools, MD;  Location: [facility];  Service: Orthopedics;  Laterality: N/A;    There were no vitals filed for this visit.  Visit Diagnosis:  Bilateral low back pain with left-sided sciatica  Weakness of left hip  Difficulty  walking  Fusion of lumbar spine      Subjective Assessment - 10/01/14 0811    Symptoms Pateient states having increased pain today noting that she may have over done it. patient was able to tolerate sitting in car over the weekend for 4 straight hours but notes increased pain today.    Currently in Pain? Yes   Pain Score 3    Pain Location Back   Pain Orientation Lower   Pain Radiating Towards Left buttocks, stabbing in foot and knee            [facility] PT Assessment - 10/01/14 0001    Strength   Strength Assessment Site Lumbar   Lumbar Flexion 3-/5   Lumbar Extension 3-/5                   [facility] Adult PT Treatment/Exercise - 10/01/14 0001    Lumbar Exercises: Stretches   Hip Flexor Stretch 2 reps;20 seconds   Hip Flexor Stretch Limitations 10x 3" to 14" box with overhead reach   Quad Stretch 3 reps;30 seconds   Quad Stretch Limitations prone thomas stretch 3x 20 seconds   Piriformis Stretch 30 seconds;2 reps   Piriformis Stretch Limitations supine figure 4 with towel    Lumbar Exercises: Standing   Row 10 reps  Theraband Level (Row) Level 4 (Blue)   Row Limitations Split stance squat row 2x 10 (4way) , punch 2x 10 (4way)   Other Standing Lumbar Exercises 3D Hip Excursion split stance with 4in step; Overhead ball slam yellow 10x; yellow ball tos to trampoline 10x with rotation.    Other Standing Lumbar Exercises Sumo walk 84ft 1 RT GTB cued for depth with yellow ball same side rotation   Lumbar Exercises: Seated   Other Seated Lumbar Exercises On exercise ball overhead dumbbell matrix 5x each with 2lb   Lumbar Exercises: Supine   Bent Knee Raise 10 reps   Bent Knee Raise Limitations Bilateral bent knee raise (2 sets of 10reps), bilateral bent knee raised with unilateral lowering 10x each, Bilateral bent knee raised with straightleg lower  10 reps 1 set   Bridge 10 reps  2 sets with arms across chest                  PT Short Term Goals - 09/23/14  0952    PT SHORT TERM GOAL #1   Title Patient will demsontrate increased hip internal rotation bilaterally to 30 degrees to improve deceleration mechanics of gait   Baseline 09/23/2014 Lt 25 degrees, Rt 25 degees   Status On-going   PT SHORT TERM GOAL #2   Title Patient will demonstrate increased hamstring strength to 4/5 MMT bilaterally to increased hip and knee stability to gait to decrease strain on lumbar spine.    Baseline 09/23/2014 Rt 4+/5, Lt 4/5   Status Achieved   PT SHORT TERM GOAL #3   Title Patient will demonstrate increased quadriceps flexibility bilaterally to 125 degrees or greater to increase gait efficiency.    Baseline 09/23/2014 Rt quad 105 degrees   PT SHORT TERM GOAL #4   Title Patient will dmeonstrate increased Glut med/max strength to 3/5 MMT bilaterally.    Baseline 09/23/2014 Bil 3+/5   Status Achieved           PT Long Term Goals - 10/01/14 0825    PT LONG TERM GOAL #1   Title Patient will be able to walk >34minutes withotu complain of pain.    Status On-going   PT LONG TERM GOAL #2   Title Patient will demonstrate increased hamstring strength of 5/5 MMT to improve ability to go up and down stairs withtout hand rail assistance.    Status On-going   PT LONG TERM GOAL #3   Title Patient will demosntrate increased abdominal strength to 4/5 MMT by demosntrateing bilateral SLR to indicate improved trunk stability.    Status On-going   PT LONG TERM GOAL #4   Title Patient will demsontrate independence with HEP.    Status On-going   PT LONG TERM GOAL #5   Title Patient will demonstrate 5/5 MMT for lumbar flexion and extension strength to improve trunk stability.    Status On-going               Plan - 10/01/14 0844    Clinical Impression Statement Session focused on abdominal strengtheing follwoign LE stretches to improve trunk stability and improve posterior pelvic tilting posture. Patient statesd no pain at end of session.    PT Next Visit Plan  Continue focus on iincreasing bilateral posterior pelvic tilting by increasing illiopsoas and rectus femoris flexibility and increased rectus abdominis and trunk stength.         Problem List Patient Active Problem List   Diagnosis Date Noted  . Lumbar pseudoarthrosis  07/14/2014  . Chronic low back pain 12/08/2013  . Nasal congestion 12/08/2013  . DM (diabetes mellitus) type 2, uncontrolled, with ketoacidosis 11/25/2013  . HTN (hypertension) 11/25/2013  . Headache(784.0) 11/25/2013  . Hyperkalemia 04/13/2012  . Anemia 04/13/2012  . Depression 04/13/2012  . Diabetic neuropathy 04/13/2012  . Constipation 02/16/2012  . Pharyngitis 05/02/2011  . Uterine bleeding 05/02/2011  . Diabetes mellitus high risk 05/02/2011  . Hypokalemia 05/02/2011   Devona Konig PT DPT [place], [name], [address] Phone: [phone]   Fax:  [phone]

## 2014-10-05 ENCOUNTER — Ambulatory Visit (HOSPITAL_COMMUNITY): Payer: BLUE CROSS/BLUE SHIELD

## 2014-10-07 ENCOUNTER — Ambulatory Visit (HOSPITAL_COMMUNITY): Payer: BLUE CROSS/BLUE SHIELD | Admitting: Physical Therapy

## 2014-10-07 ENCOUNTER — Ambulatory Visit (INDEPENDENT_AMBULATORY_CARE_PROVIDER_SITE_OTHER): Payer: Medicaid Other | Admitting: Internal Medicine

## 2014-10-07 ENCOUNTER — Telehealth (HOSPITAL_COMMUNITY): Payer: Self-pay | Admitting: Physical Therapy

## 2014-10-07 ENCOUNTER — Encounter: Payer: Self-pay | Admitting: Dietician

## 2014-10-07 ENCOUNTER — Encounter: Payer: Self-pay | Admitting: Internal Medicine

## 2014-10-07 VITALS — BP 122/80 | HR 74 | Temp 97.6°F | Resp 12 | Wt 199.0 lb

## 2014-10-07 DIAGNOSIS — E1110 Type 2 diabetes mellitus with ketoacidosis without coma: Secondary | ICD-10-CM

## 2014-10-07 DIAGNOSIS — E1310 Other specified diabetes mellitus with ketoacidosis without coma: Secondary | ICD-10-CM

## 2014-10-07 LAB — HEMOGLOBIN A1C: HEMOGLOBIN A1C: 9 % — AB (ref 4.6–6.5)

## 2014-10-07 MED ORDER — INSULIN PEN NEEDLE 32G X 4 MM MISC: Status: DC

## 2014-10-07 MED ORDER — INSULIN GLARGINE 300 UNIT/ML ~~LOC~~ SOPN: 30.0000 [IU] | PEN_INJECTOR | Freq: Every day | SUBCUTANEOUS | Status: DC

## 2014-10-07 NOTE — Progress Notes (Signed)
Patient ID: [name], female   DOB: 06/02/1961, 54 y.o.   MRN: [id number]  HPI: STEPHANEY Mcdowell is a 55 y.o.-year-old female, returning for f/u for DM2, dx in 2001, previously GDM in 1996, non-insulin-dependent, uncontrolled, with complications (2x DKA episodes, last in 11/2013 - Glu 696, CO2 12). Last visit 1.5 mo ago.  Last hemoglobin A1c was: Lab Results  Component Value Date   HGBA1C 10.2* 07/14/2014   HGBA1C 10.0 07/02/2014   HGBA1C 9.4 03/10/2014  She gets steroid inj in knee >> last was 06/2014. She gets them q3-4 mo. Last inj (knee and hip) in yesterday. Sugars in the 200s since the inj.  Since she could again afford Lantus and Novolog, we changed to: - Lantus 30 units daily at bedtime. - Novolog pen - ICR 1:5 >> ends up with 12-16 units - JanuMet 50-1000 mg 2x a day with meals >> now Kombiglyze XR 2.11-998 2x a day. Used to be on Lantus 2 years ago >> lost insurance >> Novolog 70/30.  Pt checks her sugars 4-8x a day >> fluctuating CBGs, higher sugars at bedtime: - am: 90-140 >> 57-181  >> 40 x1, 42 x1,78x1, 90-120 - 2h after b'fast: n/c >> 46 (activity), 53, 111-228 >> n/c - before lunch: 200-260 >> 116-210 >> 113, 140-160, 180 - 2h after lunch: n/c >> 40 x1, 104-176, 222 >> 70x1 - before dinner: low 100s >> 41-160, 190 >> 49x1, 160-180 - 2h after dinner: n/c >> 103-172 >> n/c - bedtime: low 200s >> 51, 74, 179-254 >> 110-289 - nighttime: n/c >> 156, 172 >> n/c She has lows mostly at night or in am. Lowest sugar was 55 (in am) >> 40 x1; she has hypoglycemia awareness at 100.  Highest sugar was 320s >> 300s  Glucometer: ReliOn   Saw nutrition 09/09/2014.  - no CKD, last BUN/creatinine:  Lab Results  Component Value Date   BUN 10 07/20/2014   CREATININE 0.86 07/20/2014  On Losartan 100. - last set of lipids: Lab Results  Component Value Date   CHOL 231* 07/20/2014   HDL 50 07/20/2014   LDLCALC 154* 07/20/2014   TRIG 136 07/20/2014   CHOLHDL 4.6  07/20/2014  On Lipitor 40. - last eye exam was in 10/2013. No DR.  - no numbness and tingling in her feet.  She had 2 back surgeries: 04/2013 and 07/14/2014.   ROS: Constitutional: no weight loss or gain, no fatigue, + hot flushes, + poor sleep Eyes: no blurry vision, no xerophthalmia ENT: no sore throat, no nodules palpated in throat, no dysphagia/odynophagia, no hoarseness Cardiovascular: no CP/SOB/palpitations/leg swelling Respiratory: no cough/no SOB Gastrointestinal: no N/V/D/C Musculoskeletal: no muscle aches/+ joint aches Skin: no rashes Neurological: no tremors/numbness/tingling/dizziness  I reviewed pt's medications, allergies, PMH, social hx, family hx, and changes were documented in the history of present illness. Otherwise, unchanged from my initial visit note: Past Medical History  Diagnosis Date  . Diabetes mellitus     h/o dka  . Depression   . Constipation   . Fibromyalgia   . Anemia   . Anxiety   . Heart murmur     child  . Hypertension   . GERD (gastroesophageal reflux disease)     tums occ  . Arthritis    Past Surgical History  Procedure Laterality Date  . Knee surgery Right 99    screws  . Cystectomy  96    stomach  . Shoulder surgery  10    right rotator  cuff  . Endometrial ablation  08  . Colonoscopy  02/26/2012    SLF: Internal hemorrhoids/TCS IN 10 YEARS WITH PROPOFOL  . Lumbar laminectomy/decompression microdiscectomy N/A 05/14/2013    Procedure: RIGHT L4-L5 DECOMPRESSION AND CYST REMOVAL;  Surgeon: Melina Schools, MD;  Location: [facility];  Service: Orthopedics;  Laterality: N/A;  . Lumbar percutaneous pedicle screw 1 level N/A 07/14/2014    Procedure: REMOVAL OF L5-S1 PEDICLE SCREWS,FUSION L5-S1, REINSERTION OF PEDICLE SCREWS ILIAC CREST BONE GRAFT;  Surgeon: Melina Schools, MD;  Location: [facility];  Service: Orthopedics;  Laterality: N/A;   History   Social History  . Marital Status: Divorced    Spouse Name: N/A    Number of Children: 4    Occupational History  . [employer]   Social History Main Topics  . Smoking status: Former Smoker -- 0.50 packs/day for 15 years    Types: Cigarettes    Quit date: 04/29/1991  . Smokeless tobacco: Never Used  . Alcohol Use: 0.6 oz/week    1 Glasses of wine per week     Comment: occ wine; not now  . Drug Use: No     Comment: crack/cocaine 20 yrs ago   Current Outpatient Prescriptions on File Prior to Visit  Medication Sig Dispense Refill  . amLODipine (NORVASC) 2.5 MG tablet Take 1 tablet (2.5 mg total) by mouth daily. 90 tablet 3  . aspirin EC 81 MG tablet Take 81 mg by mouth daily.     Marland Kitchen atorvastatin (LIPITOR) 40 MG tablet Take 40 mg by mouth at bedtime.     . docusate sodium (COLACE) 100 MG capsule Take 1 capsule (100 mg total) by mouth 3 (three) times daily as needed for constipation. 30 capsule 0  . doxycycline (VIBRAMYCIN) 50 MG capsule Take 2 capsules (100 mg total) by mouth 2 (two) times daily. (Patient not taking: Reported on 09/09/2014) 20 capsule 0  . fexofenadine (ALLEGRA) 180 MG tablet Take 180 mg by mouth daily.     . fluticasone (FLONASE) 50 MCG/ACT nasal spray Place 1 spray into both nostrils daily. 16 g 3  . gabapentin (NEURONTIN) 300 MG capsule Take 1 capsule (300 mg total) by mouth 3 (three) times daily. 90 capsule 0  . insulin aspart (NOVOLOG FLEXPEN) 100 UNIT/ML FlexPen Inject 12-15 Units into the skin 3 (three) times daily with meals. 15 mL 2  . Insulin Glargine (LANTUS SOLOSTAR) 100 UNIT/ML Solostar Pen Inject 30 Units into the skin at bedtime. 15 mL 2  . Insulin Pen Needle (BD PEN NEEDLE NANO U/F) 32G X 4 MM MISC Use 4x a day 200 each 11  . losartan-hydrochlorothiazide (HYZAAR) 100-12.5 MG per tablet Take 1 tablet by mouth daily. 30 tablet 3  . meloxicam (MOBIC) 15 MG tablet Take 15 mg by mouth daily.   0  . meloxicam (MOBIC) 7.5 MG tablet Take 1 tablet (7.5 mg total) by mouth daily. (Patient not taking: Reported on 08/30/2014) 30 tablet 1  . methocarbamol (ROBAXIN)  500 MG tablet Take 1 tablet (500 mg total) by mouth 3 (three) times daily as needed for muscle spasms. 60 tablet 0  . metoCLOPramide (REGLAN) 10 MG tablet Take 1 tablet (10 mg total) by mouth every 8 (eight) hours as needed (headache with nausea). (Patient not taking: Reported on 09/09/2014) 20 tablet 0  . metroNIDAZOLE (FLAGYL) 500 MG tablet Take 1 tablet (500 mg total) by mouth 2 (two) times daily. 14 tablet 0  . ondansetron (ZOFRAN) 4 MG tablet Take 1 tablet (  4 mg total) by mouth every 8 (eight) hours as needed for nausea or vomiting. 20 tablet 0  . oxyCODONE-acetaminophen (PERCOCET) 10-325 MG per tablet Take 1 tablet by mouth every 4 (four) hours as needed for pain. 60 tablet 0  . Saxagliptin-Metformin 2.11-998 MG TB24 Take 2 tablets by mouth every morning. 60 tablet 2  . traZODone (DESYREL) 100 MG tablet Take 100 mg by mouth at bedtime as needed for sleep.     . TRUETEST TEST test strip 1 each by Other route 4 (four) times daily.   12   No current facility-administered medications on file prior to visit.   No Known Allergies Family History  Problem Relation Age of Onset  . Hypertension Mother   . Cancer Mother   . Asthma Sister   . Asthma Brother   . Stomach cancer      distant relative on dad's side of family  . Colon cancer Neg Hx   . Diabetes Paternal Grandmother    PE: BP 122/80 mmHg  Pulse 74  Temp(Src) 97.6 F (36.4 C) (Oral)  Resp 12  Wt 199 lb (90.266 kg)  SpO2 95% Body mass index is 33.12 kg/(m^2). Wt Readings from Last 3 Encounters:  10/07/14 199 lb (90.266 kg)  09/09/14 195 lb (88.451 kg)  08/30/14 199 lb (90.266 kg)   Constitutional: overweight, in NAD Eyes: PERRLA, EOMI, no exophthalmos ENT: moist mucous membranes, no thyromegaly, no cervical lymphadenopathy Cardiovascular: RRR, No MRG Respiratory: CTA B Gastrointestinal: abdomen soft, NT, ND, BS+ Musculoskeletal: no deformities, strength intact in all 4 Skin: moist, warm, no rashes Neurological: no tremor  with outstretched hands, DTR normal in all 4  ASSESSMENT: 1. 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 Patient with long-standing, uncontrolled diabetes. We switched her from premixed to basal-bolus + oral antidiabetic regimen >> sugars very variable, with both highs and lows, with higher sugars at that time. We will try to switch to toujeo which offers some protection from lows. I will leave the dose of the basal insulin the same, we may need to decrease in the future if she continues to have lows. We'll continue the same insulin to carb ratio but will add a sliding scale as described below Patient Instructions  Please stop Lantus and start Toujeo 30 units at bedtime.  Please let me know if you restart having lows in am. Please continue: - Kombiglyze XR 2.11-998 2x a day - Novolog pen - ICR 1:5, but add a NovoLog Sliding scale: - 150-175: + 1 unit  - 176-200: + 2 units  - 201-225: + 3 units  - 226-250: + 4 units  - >250: + 5 units  Please stop at the lab.  Please return in 1 month with your sugar log.   - continue checking sugars at different times of the day - check 4 times a day, rotating checks - advised for yearly eye exams >> she is UTD - Will check a hemoglobin A1c today  - Return to clinic in 1 mo with sugar log     Office Visit on 10/07/2014  Component Date Value Ref Range Status  . Hgb A1c MFr Bld 10/07/2014 9.0* 4.6 - 6.5 % Final   Glycemic Control Guidelines for People with Diabetes:Non Diabetic:  <6%Goal of Therapy: <7%Additional Action Suggested:  >8%    HbA1c improved!

## 2014-10-07 NOTE — Telephone Encounter (Signed)
Phone call made to discern why patient missed appointment and advised patient to return call. Patient informed of next appointment on 10/12/14 at 8:45am

## 2014-10-07 NOTE — Patient Instructions (Signed)
Please stop Lantus and start Toujeo 30 units at bedtime.  Please let me know if you restart having lows in am. Please continue: - Kombiglyze XR 2.11-998 2x a day - Novolog pen - ICR 1:5, but add a NovoLog Sliding scale: - 150-175: + 1 unit  - 176-200: + 2 units  - 201-225: + 3 units  - 226-250: + 4 units  - >250: + 5 units  Please stop at the lab.  Please return in 1 month with your sugar log.

## 2014-10-12 ENCOUNTER — Telehealth (HOSPITAL_COMMUNITY): Payer: Self-pay

## 2014-10-12 ENCOUNTER — Ambulatory Visit (HOSPITAL_COMMUNITY): Payer: BLUE CROSS/BLUE SHIELD

## 2014-10-14 ENCOUNTER — Ambulatory Visit (HOSPITAL_COMMUNITY): Payer: BLUE CROSS/BLUE SHIELD | Admitting: Physical Therapy

## 2014-10-15 ENCOUNTER — Encounter (HOSPITAL_COMMUNITY): Payer: Self-pay | Admitting: Physical Therapy

## 2014-10-15 ENCOUNTER — Telehealth (HOSPITAL_COMMUNITY): Payer: Self-pay | Admitting: Physical Therapy

## 2014-10-15 IMAGING — CR DG CHEST 2V
2 series · 2 of 2 positions shown · non-contrast
Comparison: 01/11/2010

CLINICAL DATA: Preop for lumbar spine decompression

EXAM:
CHEST  2 VIEW

[w chest pa]
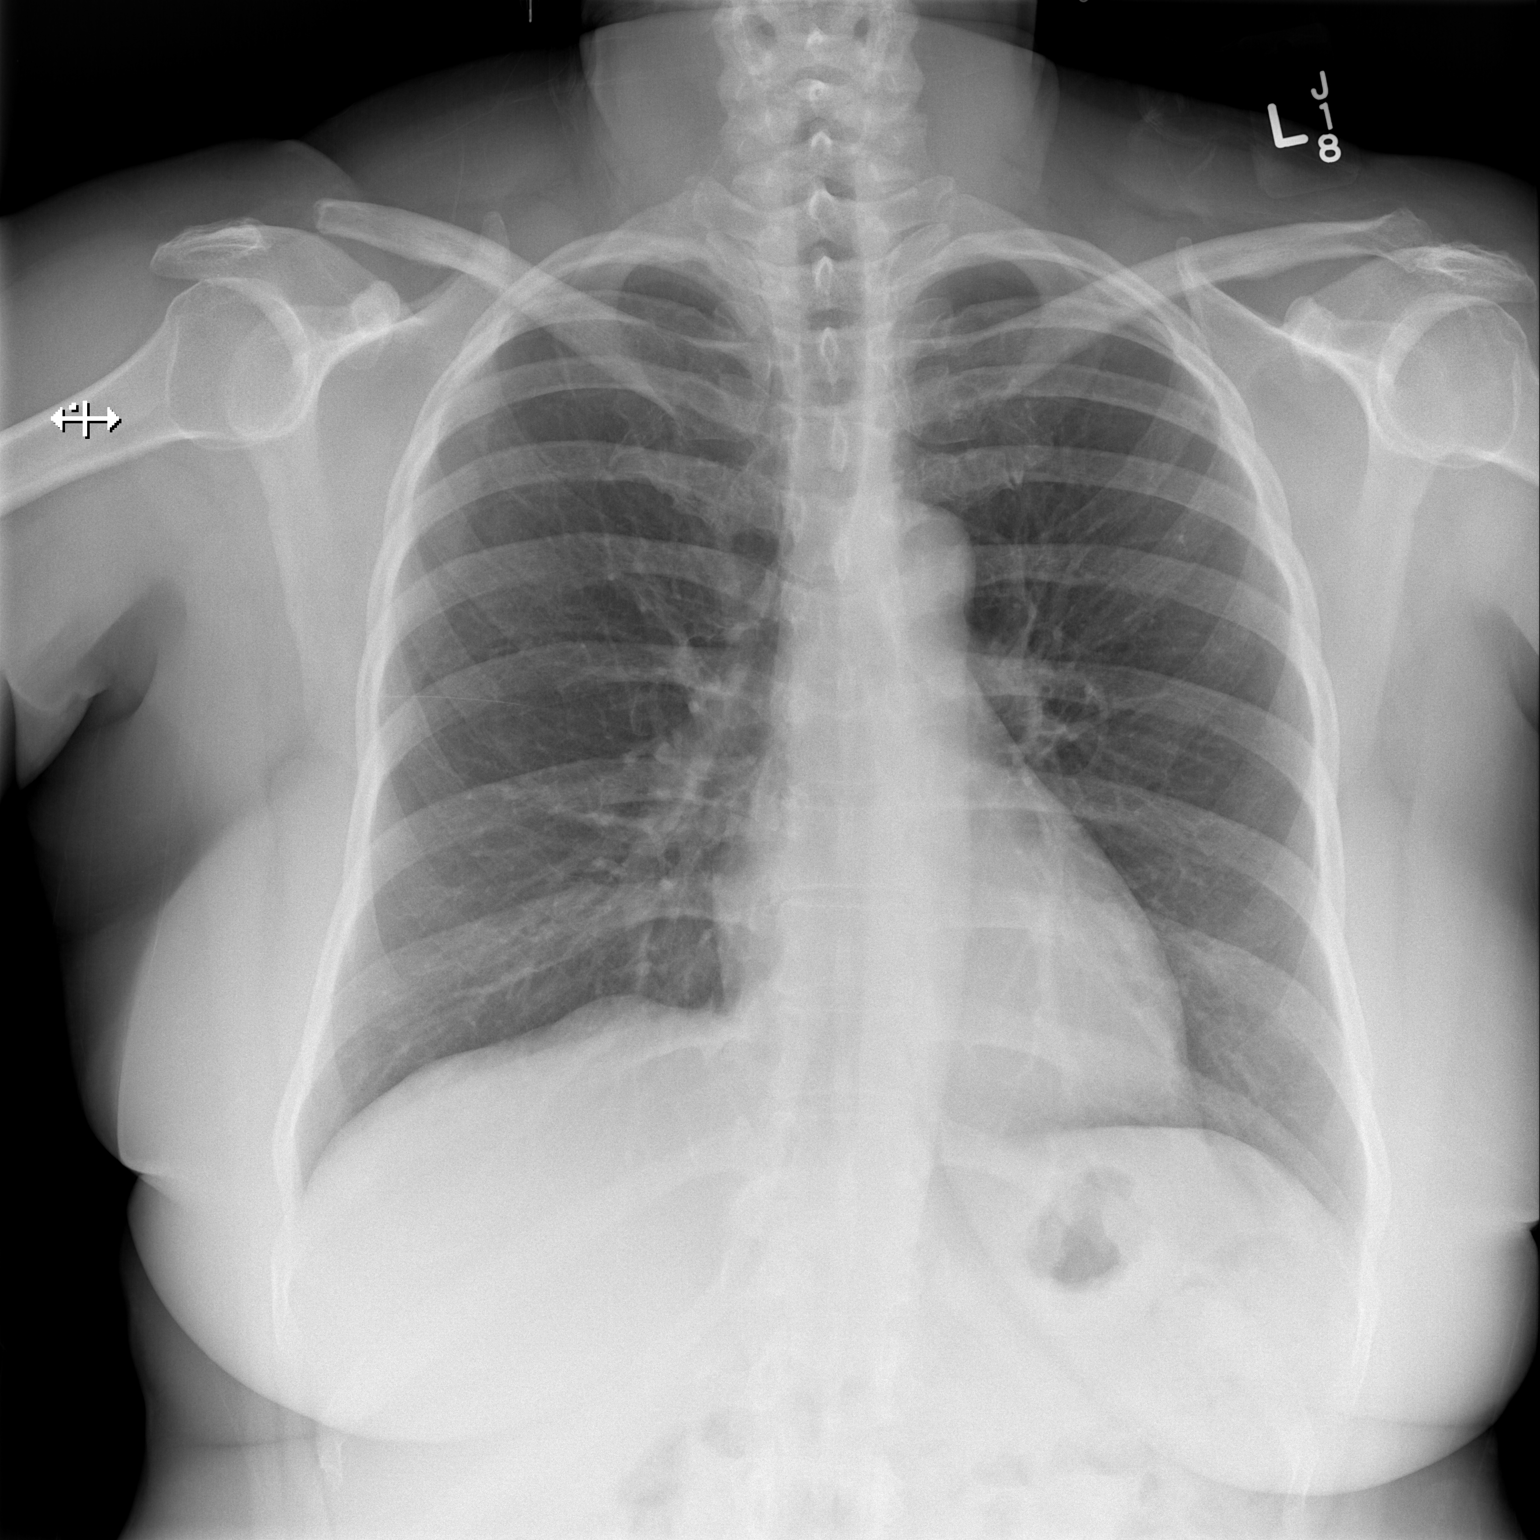

[w chest lat]
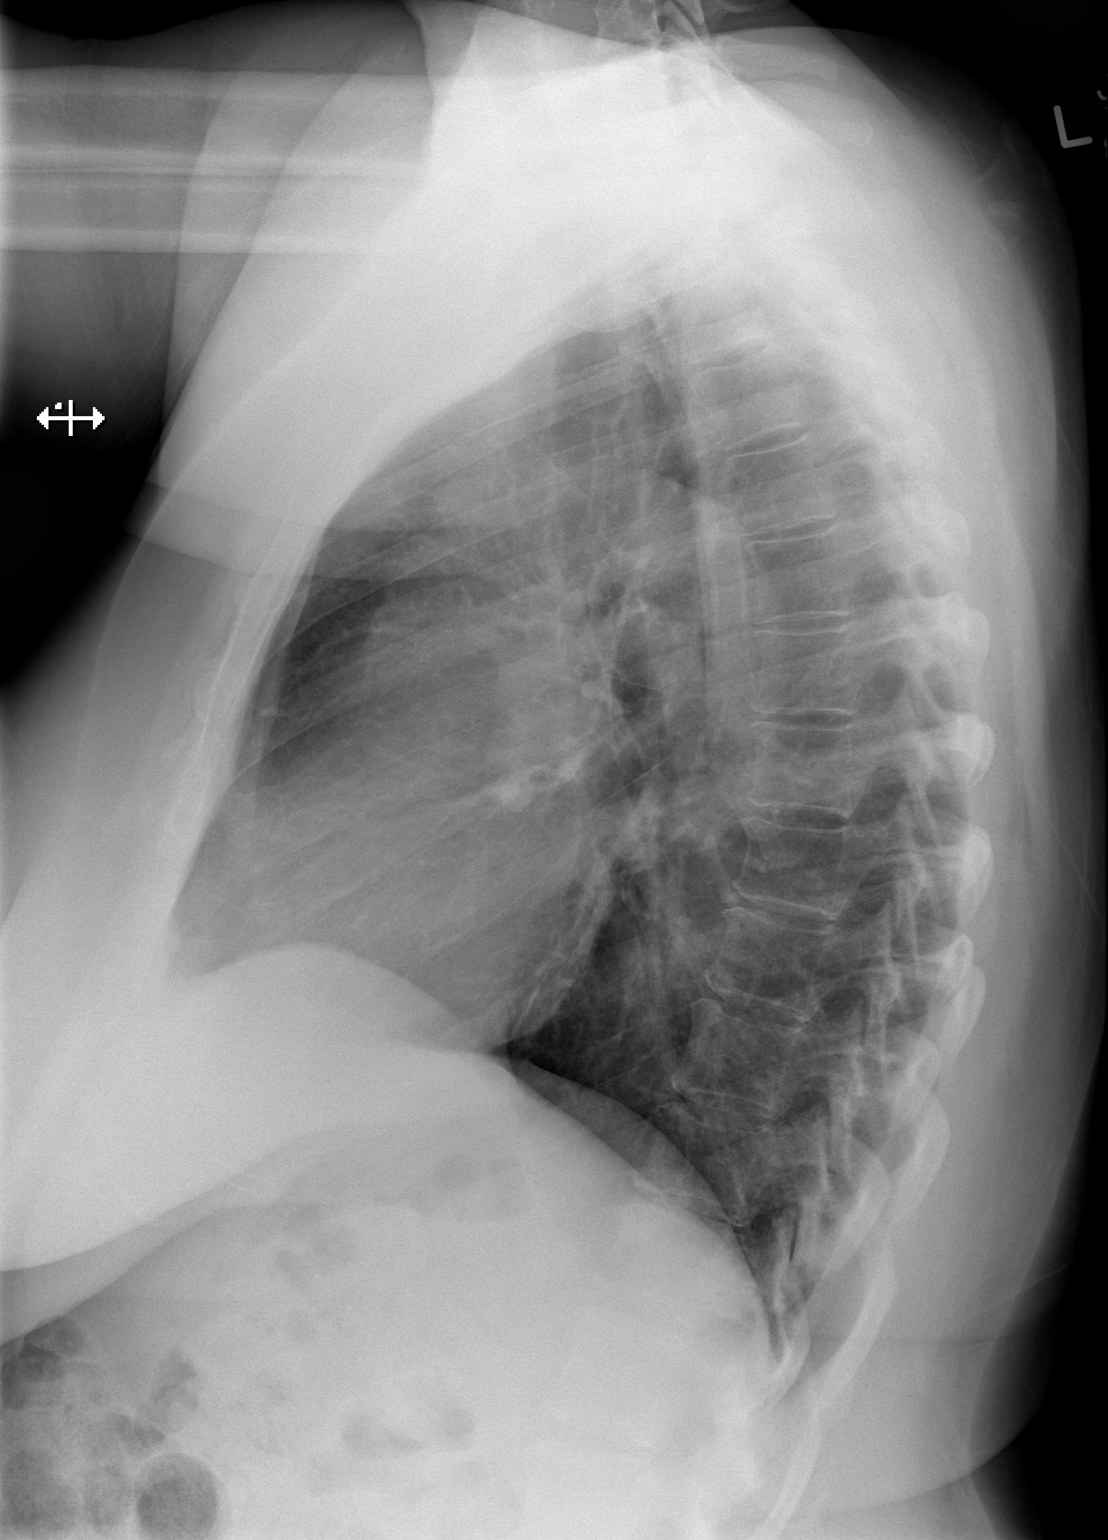

[2 of 2 positions shown; findings below may reference images not displayed]

FINDINGS: Cardiomediastinal silhouette is stable. No acute infiltrate or
pleural effusion. No pulmonary edema. Stable minimal degenerative
changes thoracic spine.
IMPRESSION: No active cardiopulmonary disease.

## 2014-10-15 NOTE — Therapy (Signed)
[facility] [place], [name], [address] Phone: [phone]   Fax:  [phone]  Patient Details  Name: [name] MRN: [id number] Date of Birth: 11-27-1960 Referring Provider:  No ref. provider found  Encounter Date: 10/15/2014  PHYSICAL THERAPY DISCHARGE SUMMARY  Visits from Start of Care: 9  Current functional level related to goals / functional outcomes: PT SHORT TERM GOAL #1    Title Patient will demsontrate increased hip internal rotation bilaterally to 30 degrees to improve deceleration mechanics of gait   Baseline 09/23/2014 Lt 25 degrees, Rt 25 degees   Status On-going   PT SHORT TERM GOAL #2   Title Patient will demonstrate increased hamstring strength to 4/5 MMT bilaterally to increased hip and knee stability to gait to decrease strain on lumbar spine.    Baseline 09/23/2014 Rt 4+/5, Lt 4/5   Status Achieved   PT SHORT TERM GOAL #3   Title Patient will demonstrate increased quadriceps flexibility bilaterally to 125 degrees or greater to increase gait efficiency.    Baseline 09/23/2014 Rt quad 105 degrees   PT SHORT TERM GOAL #4   Title Patient will dmeonstrate increased Glut med/max strength to 3/5 MMT bilaterally.    Baseline 09/23/2014 Bil 3+/5   Status Achieved           PT Long Term Goals - 10/01/14 0825    PT LONG TERM GOAL #1   Title Patient will be able to walk >52mnutes withotu complain of pain.    Status On-going   PT LONG TERM GOAL #2   Title Patient will demonstrate increased hamstring strength of 5/5 MMT to improve ability to go up and down stairs withtout hand rail assistance.    Status On-going   PT LONG TERM GOAL #3   Title Patient will demosntrate increased abdominal strength to 4/5 MMT by demosntrateing bilateral SLR to indicate improved trunk stability.    Status On-going   PT LONG TERM GOAL #4   Title  Patient will demsontrate independence with HEP.    Status On-going   PT LONG TERM GOAL #5   Title Patient will demonstrate 5/5 MMT for lumbar flexion and extension strength to improve trunk stability.    Status On-going          Plan: Patient agrees to discharge.  Patient goals were partially met. Patient is being discharged due to not returning since the last visit.  ?????       CDevona KonigPT DPT [id number] [address]Phone: [phone]  Fax:  [phone]

## 2014-10-15 NOTE — Telephone Encounter (Signed)
Patient called by therapist, message left informing patient that she is discharged for non-compliance as patient has missed 3 appointments in a row.Marland Kitchen

## 2014-10-18 IMAGING — RF DG LUMBAR SPINE 2-3V
1 series · 3 of 3 positions shown · non-contrast
Comparison: Radiographs 05/14/2013. Abdominal CT 04/29/2011.

CLINICAL DATA: L5-S1 TLIF with cyst removal.

EXAM:
LUMBAR SPINE - 2-3 VIEW; DG C-ARM GT 120 MIN

[Series 1: run · 3 of 3 slices shown]
[im 1/3]
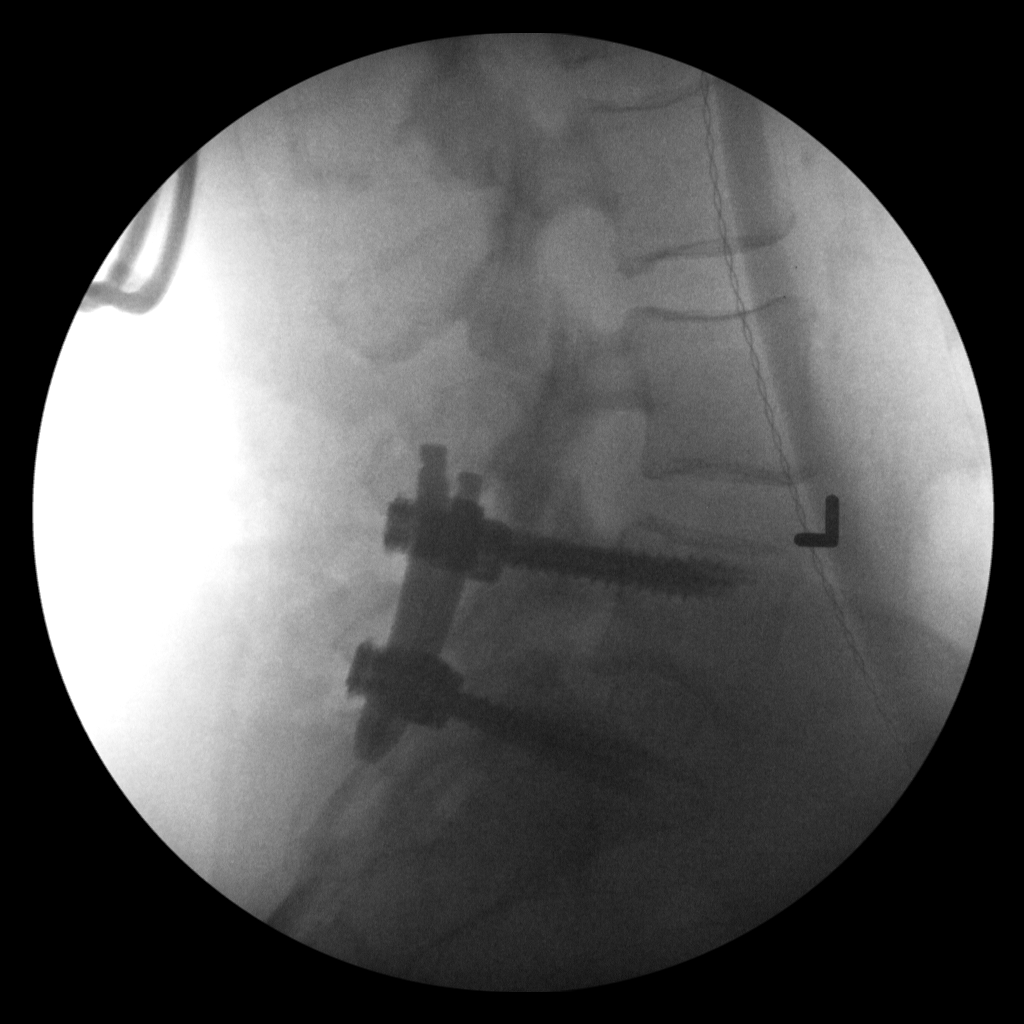
[im 2/3]
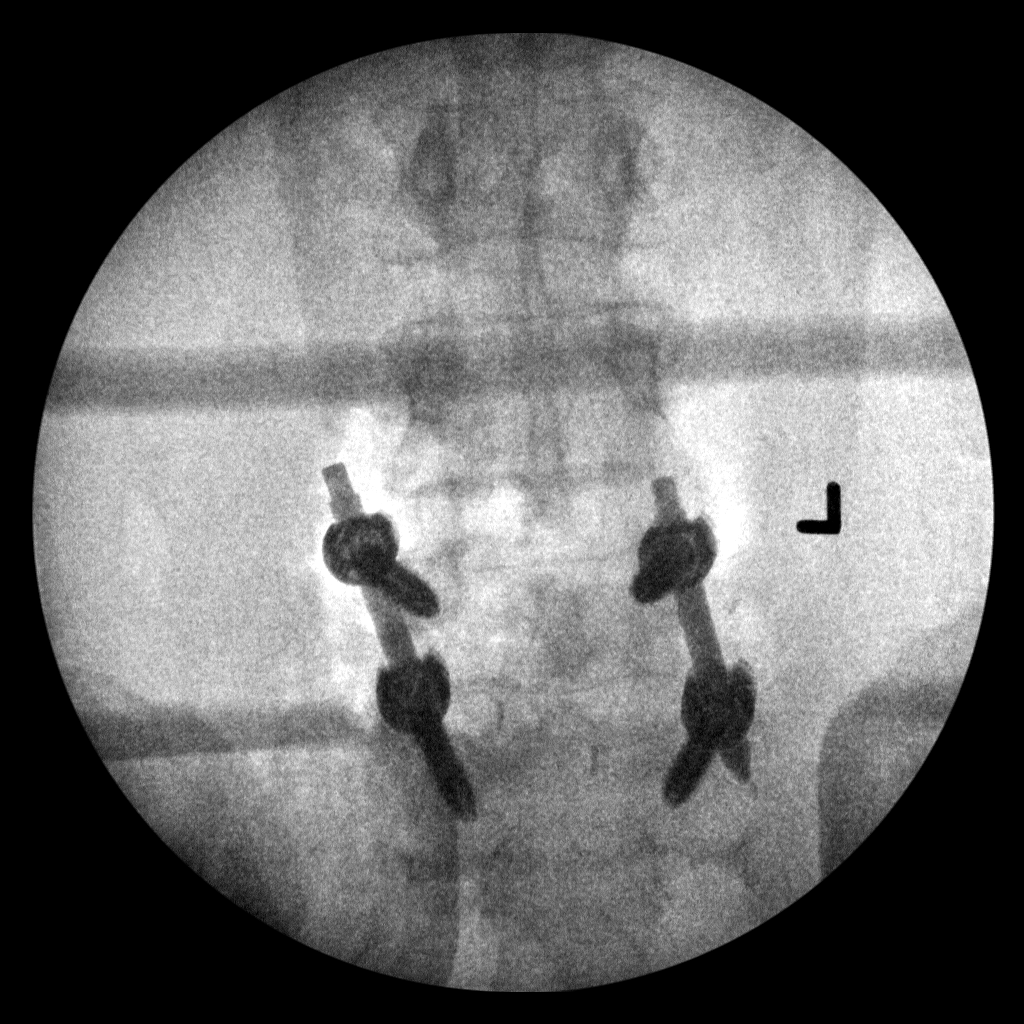
[im 3/3]
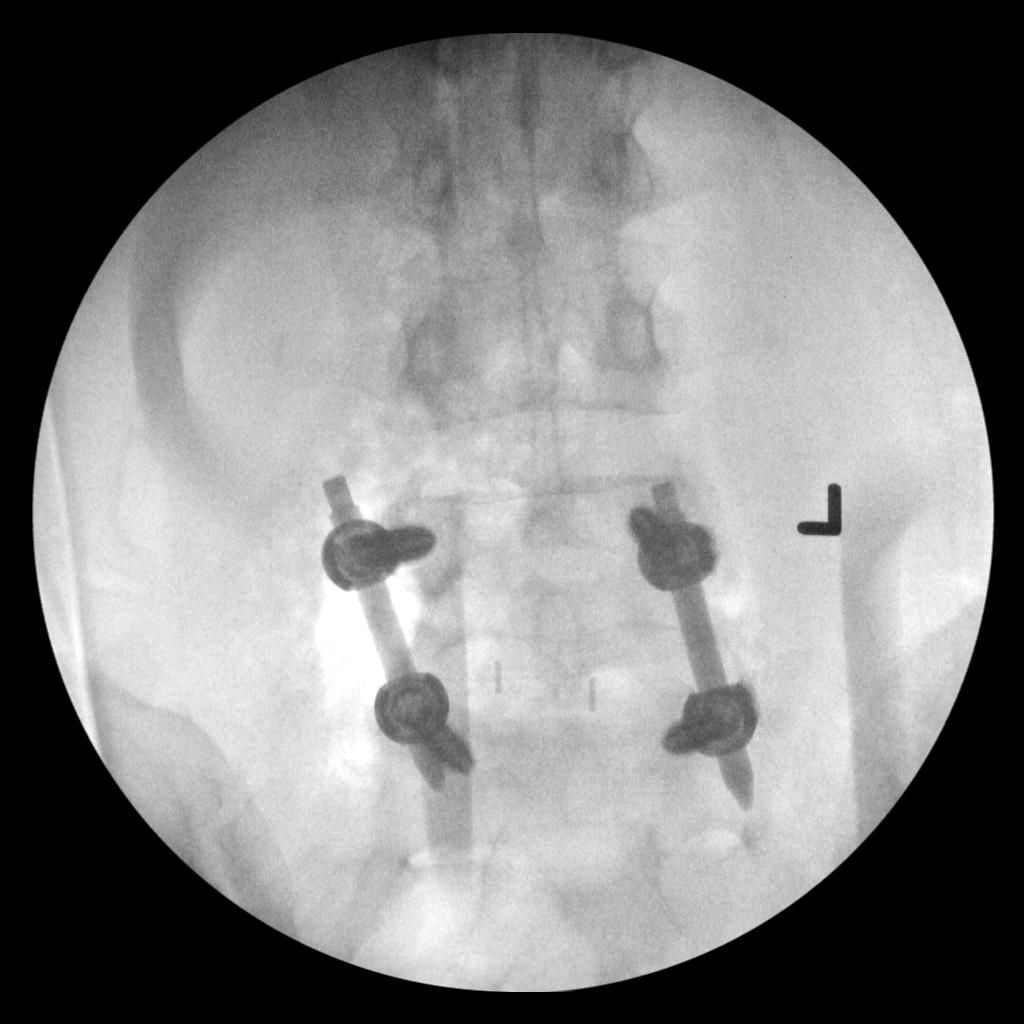

[3 of 3 positions shown; findings below may reference images not displayed]

FINDINGS: Spot PA and lateral fluoroscopic images of the lower lumbar spine
demonstrate interval pedicle screw and interbody fusion at L5-S1. No
complications are identified.
IMPRESSION: Intraoperative views following L5-S1 fusion.

## 2014-10-19 IMAGING — CR DG LUMBAR SPINE 2-3V
3 series · 3 of 3 positions shown · non-contrast
Comparison: Intraoperative images [id number] and earlier.

CLINICAL DATA: 51-year-old female status post lumbar fusion
yesterday. Initial encounter.

EXAM:
LUMBAR SPINE - 2-3 VIEW

[t lumbar spine ap]
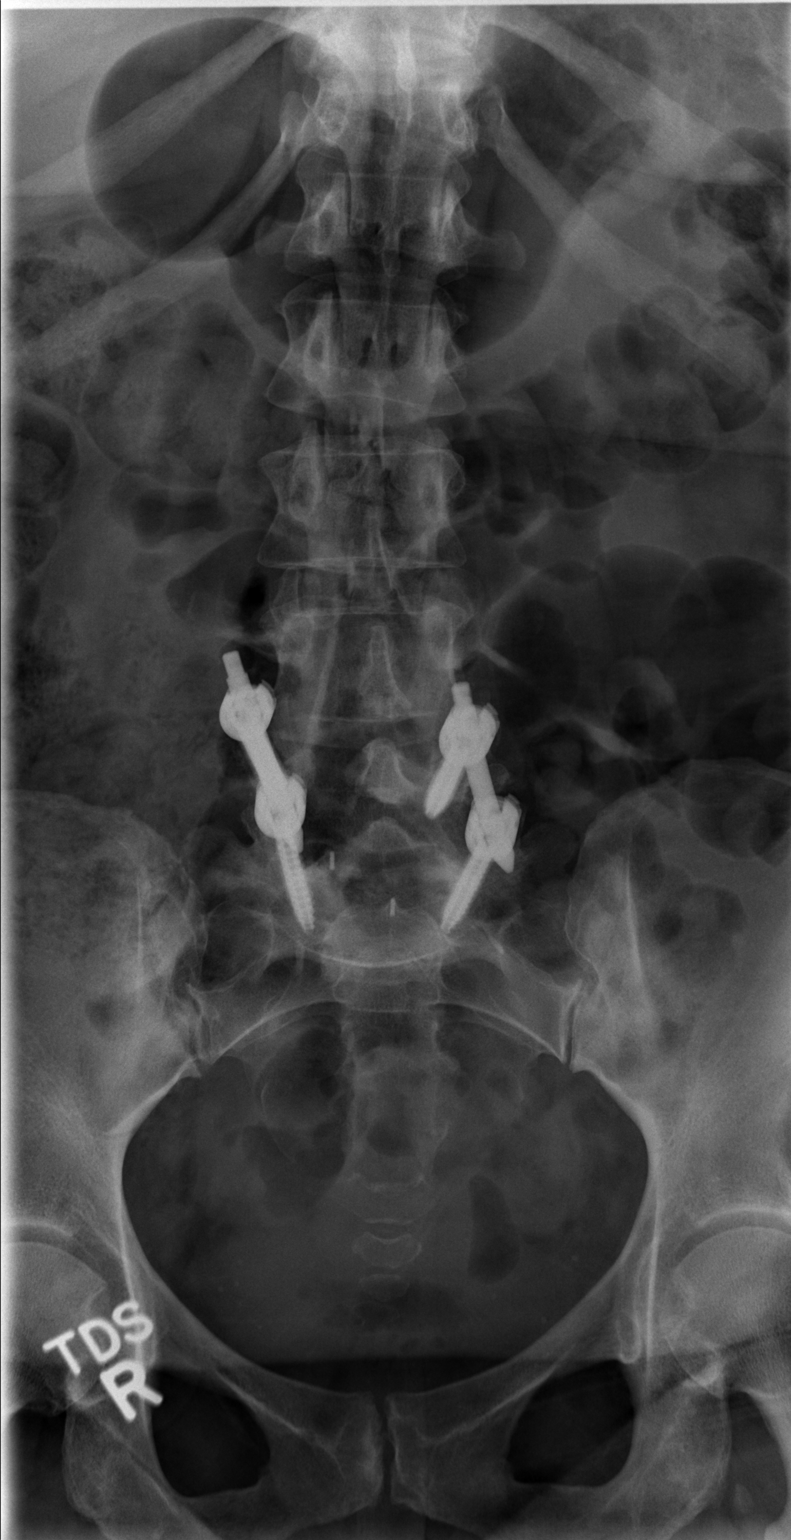

[t lumbar spine lat]
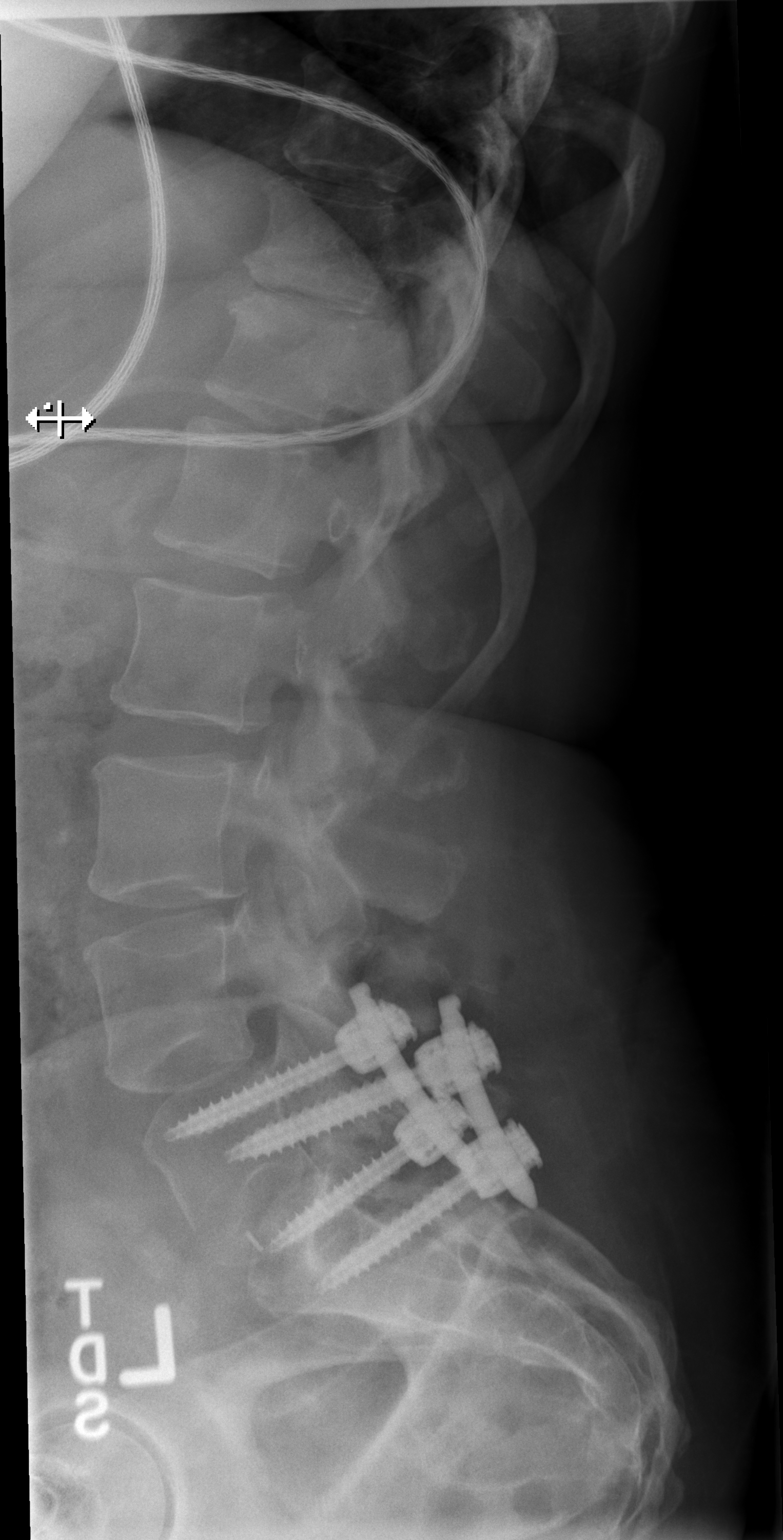

[t lumbar l-5 s-1 spot]
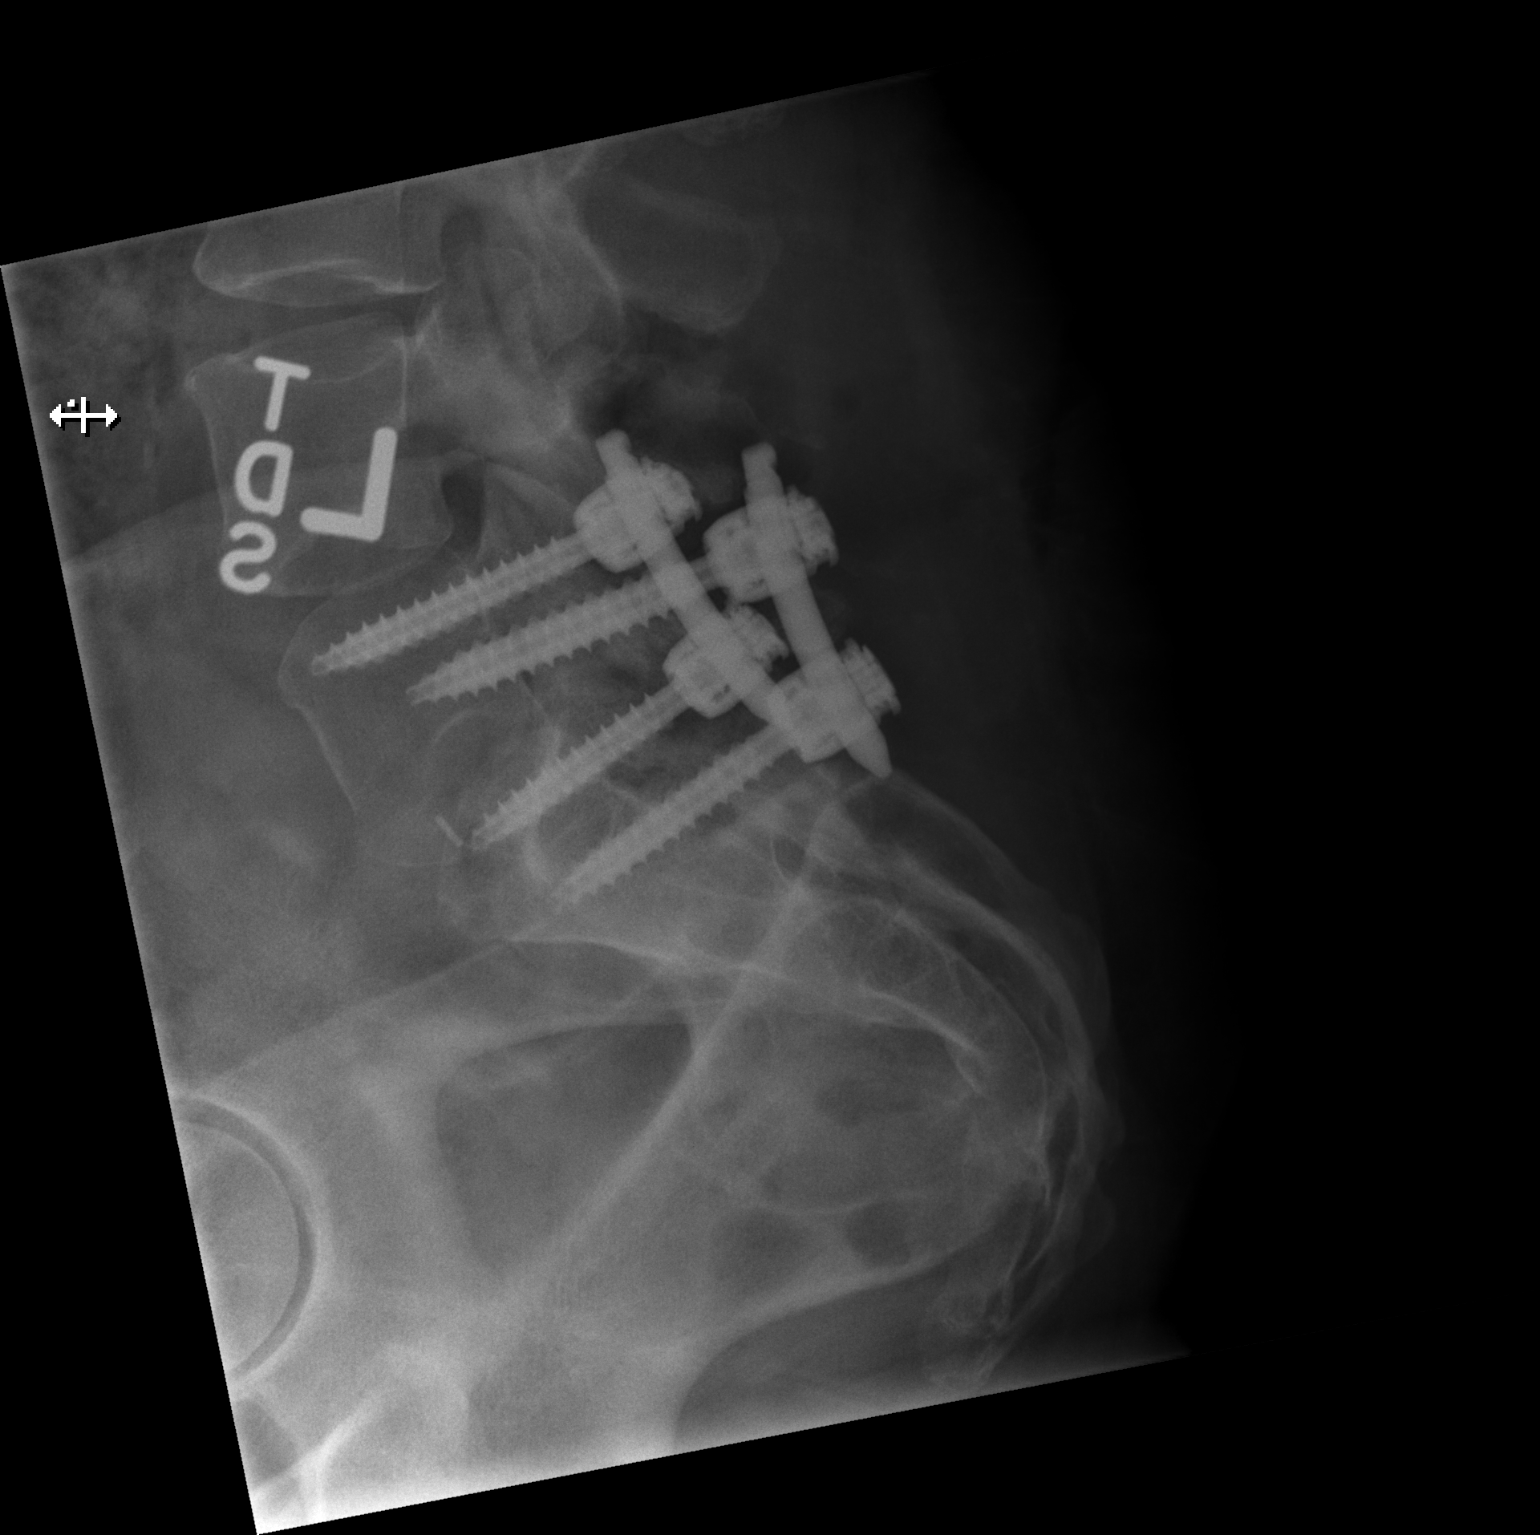

[3 of 3 positions shown; findings below may reference images not displayed]

FINDINGS: Same numbering system as on 05/14/2013 at 3433 hrs. Interval L5-S1
posterior and interbody fusion. Hardware appears stable and intact.
Stable vertebral height and alignment. Other lumbar levels
unchanged. Sacral ala and SI joints appear stable.
IMPRESSION: L5-S1 posterior and interbody fusion with no adverse features
identified.

## 2014-10-20 IMAGING — CR DG PORTABLE PELVIS
1 series · 1 of 1 positions shown · non-contrast
Comparison: None.

CLINICAL DATA: Fell last night with pain and left hip

EXAM:
PORTABLE PELVIS

[AP]
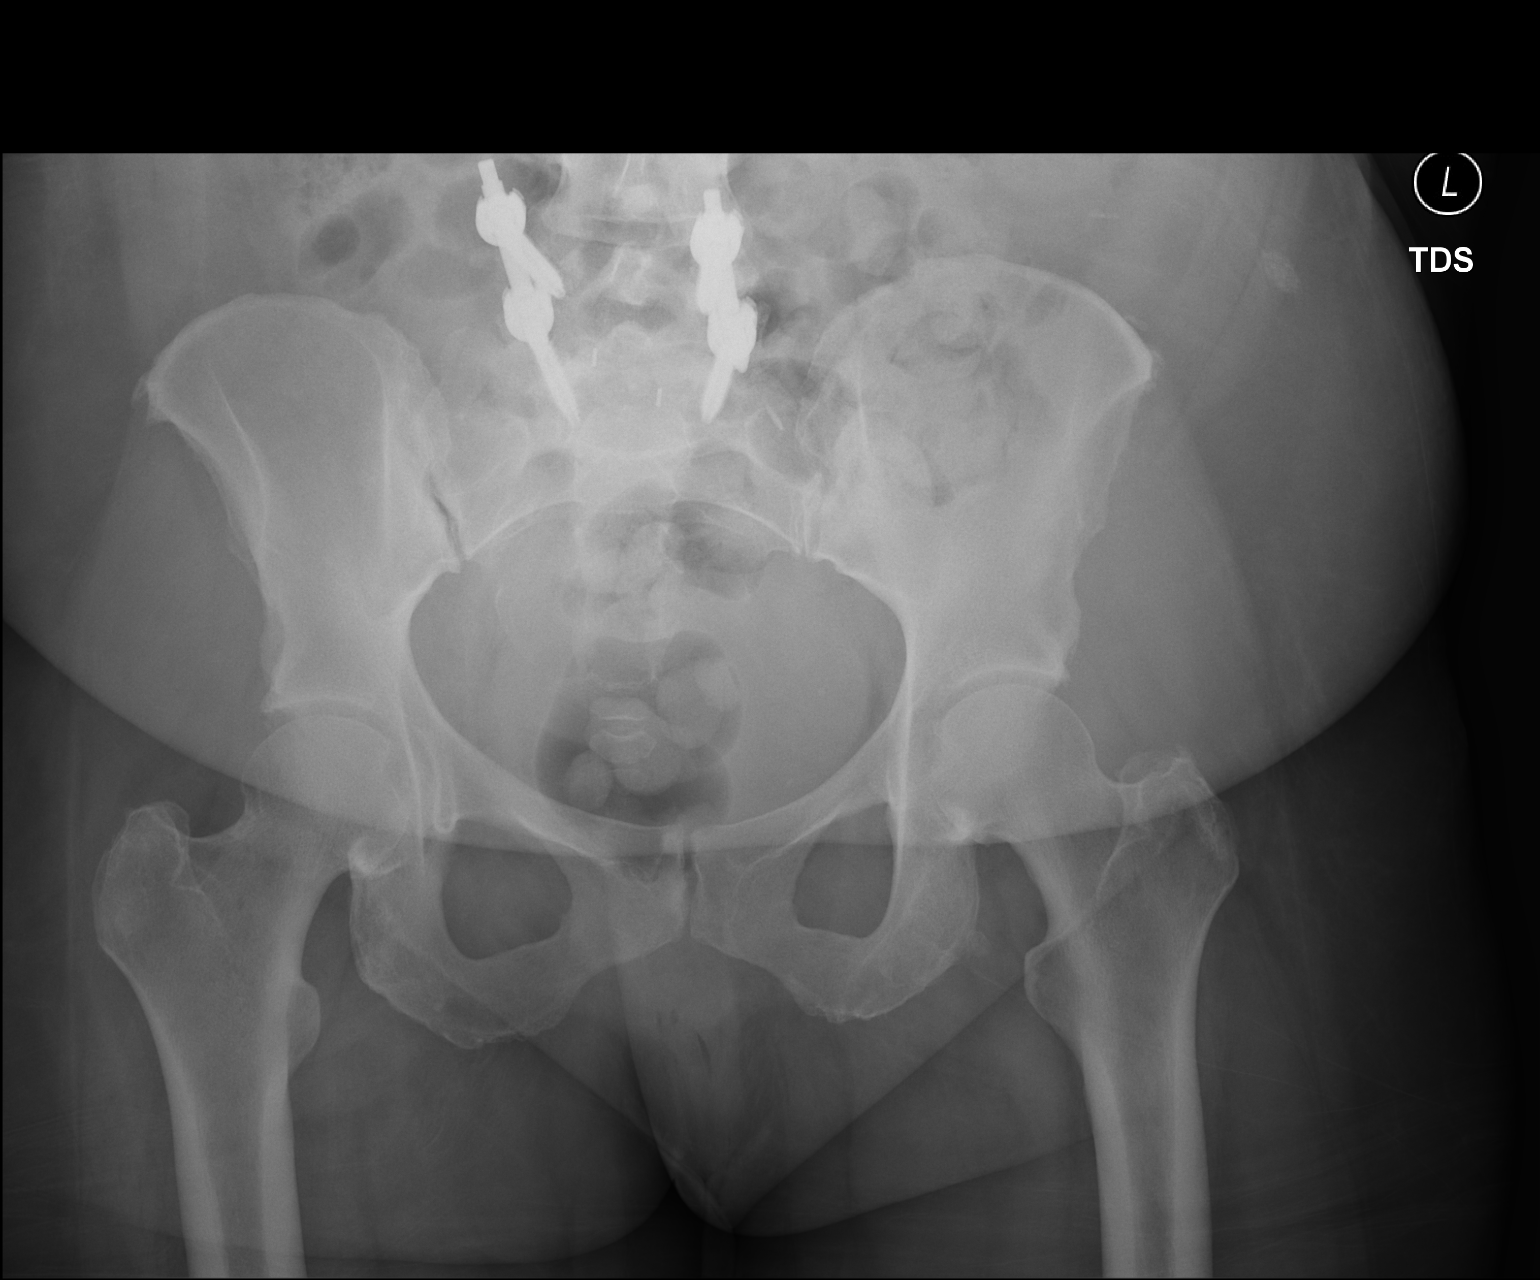

[1 of 1 positions shown; findings below may reference images not displayed]

FINDINGS: Bones of the pelvis are intact. No evidence of proximal femur
fracture or dislocation. Lower lumbar spine posterior fixation
noted.
IMPRESSION: No acute finding.

## 2014-11-02 ENCOUNTER — Ambulatory Visit: Payer: Self-pay | Admitting: Internal Medicine

## 2014-11-03 ENCOUNTER — Telehealth: Payer: Self-pay | Admitting: Internal Medicine

## 2014-11-03 NOTE — Telephone Encounter (Signed)
Pt has been started on new insulin med toujeo solostar she started on the 6th of April and she is still waking up with BS in the 200-300 range

## 2014-11-04 NOTE — Telephone Encounter (Signed)
Please read message below and advise.  

## 2014-11-04 NOTE — Telephone Encounter (Signed)
Called pt and advised her per Dr Arman Filter message. Pt stated that she did not think that the Toujeo was helping her at all. She decided to do Lantus 30 units last night and her blood sugar was 145 this morning. She stated that on Toujeo her blood sugars have been high every morning. Please advise.

## 2014-11-04 NOTE — Telephone Encounter (Signed)
Please increase Toujeo to 36 units. Call back on Mon. With sugars.

## 2014-11-04 NOTE — Telephone Encounter (Signed)
OK, no pb, we can continue with Lantus. Toujeo takes about 3-4 days to kick in, but she is past that. Continue Lantus.

## 2014-11-04 NOTE — Telephone Encounter (Signed)
Advised pt per Dr Arman Filter message below. Pt to call back on Monday to let us know how her blood sugars are. Be advised.

## 2014-11-09 ENCOUNTER — Ambulatory Visit: Payer: Self-pay | Admitting: Internal Medicine

## 2014-11-12 ENCOUNTER — Ambulatory Visit (INDEPENDENT_AMBULATORY_CARE_PROVIDER_SITE_OTHER): Payer: BLUE CROSS/BLUE SHIELD | Admitting: Obstetrics & Gynecology

## 2014-11-12 ENCOUNTER — Encounter: Payer: Self-pay | Admitting: Obstetrics & Gynecology

## 2014-11-12 VITALS — BP 130/70 | HR 76 | Wt 200.0 lb

## 2014-11-12 DIAGNOSIS — A5901 Trichomonal vulvovaginitis: Secondary | ICD-10-CM

## 2014-11-12 MED ORDER — METRONIDAZOLE 500 MG PO TABS: 500.0000 mg | ORAL_TABLET | Freq: Two times a day (BID) | ORAL | Status: DC

## 2014-11-12 MED ORDER — FLUCONAZOLE 150 MG PO TABS: 150.0000 mg | ORAL_TABLET | Freq: Once | ORAL | Status: DC

## 2014-11-12 NOTE — Progress Notes (Signed)
Patient ID: [name], female   DOB: 1961-04-14, 54 y.o.   MRN: [id number] Chief Complaint  Patient presents with  . gyn visit    vaginal discharge. yellow in color.   Blood pressure 130/70, pulse 76, weight 200 lb (90.719 kg).  Patient comes in complaining of a yellowish vaginal discharge It has been present for a couple of weeks No odor Irritation but no real itching She is diabetic and had many yeast infections before  No burning with urination, frequency or urgency No nausea, vomiting or diarrhea Nor fever chills or other constitutional symptoms   Exam Normal external female genitalia no external yeast present Vagina frothy gray discharge present Cervix no lesions  Wet Prep:   A sample of vaginal discharge was obtained from the posterior fornix using a cotton swab. 2 drops of saline were placed on a slide and the cotton swab was immersed in the saline. Microscopic evaluation was performed and results were as follows:  Negative  for yeast  Negative for clue cells , consistent with Bacterial vaginosis Positive for trichomonas  Normal WBC population   Whiff test: Negative  Impression  Trichomonas vaginalis: Metronidazole 500 mg twice a day for 7 days Test of cure evaluation in 3 weeks No sex until test of cure evaluation Diflucan if needed for post metronidazole yeast infection as requested by patient    Past Medical History  Diagnosis Date  . Diabetes mellitus     h/o dka  . Depression   . Constipation   . Fibromyalgia   . Anemia   . Anxiety   . Heart murmur     child  . Hypertension   . GERD (gastroesophageal reflux disease)     tums occ  . Arthritis     Past Surgical History  Procedure Laterality Date  . Knee surgery Right 99    screws  . Cystectomy  96    stomach  . Shoulder surgery  10    right rotator cuff  . Endometrial ablation  08  . Colonoscopy  02/26/2012    SLF: Internal hemorrhoids/TCS IN 10 YEARS WITH PROPOFOL  . Lumbar  laminectomy/decompression microdiscectomy N/A 05/14/2013    Procedure: RIGHT L4-L5 DECOMPRESSION AND CYST REMOVAL;  Surgeon: Melina Schools, MD;  Location: [facility];  Service: Orthopedics;  Laterality: N/A;  . Lumbar percutaneous pedicle screw 1 level N/A 07/14/2014    Procedure: REMOVAL OF L5-S1 PEDICLE SCREWS,FUSION L5-S1, REINSERTION OF PEDICLE SCREWS ILIAC CREST BONE GRAFT;  Surgeon: Melina Schools, MD;  Location: [facility];  Service: Orthopedics;  Laterality: N/A;    OB History    Gravida Para Term Preterm AB TAB SAB Ectopic Multiple Living   5 4 4  1  1   4       No Known Allergies  History   Social History  . Marital Status: Divorced    Spouse Name: N/A  . Number of Children: N/A  . Years of Education: N/A   Social History Main Topics  . Smoking status: Former Smoker -- 0.50 packs/day for 15 years    Types: Cigarettes    Quit date: 04/29/1991  . Smokeless tobacco: Never Used  . Alcohol Use: 0.6 oz/week    1 Glasses of wine per week     Comment: occ wine; not now  . Drug Use: No     Comment: crack/cocaine 20 yrs ago  . Sexual Activity: No   Other Topics Concern  . None   Social History Narrative  Family History  Problem Relation Age of Onset  . Hypertension Mother   . Cancer Mother   . Asthma Sister   . Asthma Brother   . Stomach cancer      distant relative on dad's side of family  . Colon cancer Neg Hx   . Diabetes Paternal Grandmother

## 2014-11-23 ENCOUNTER — Encounter: Payer: Self-pay | Admitting: Internal Medicine

## 2014-12-03 ENCOUNTER — Ambulatory Visit (INDEPENDENT_AMBULATORY_CARE_PROVIDER_SITE_OTHER): Payer: BLUE CROSS/BLUE SHIELD | Admitting: Obstetrics & Gynecology

## 2014-12-03 ENCOUNTER — Encounter: Payer: Self-pay | Admitting: Obstetrics & Gynecology

## 2014-12-03 VITALS — BP 120/70 | HR 68 | Wt 197.0 lb

## 2014-12-03 DIAGNOSIS — N72 Inflammatory disease of cervix uteri: Secondary | ICD-10-CM

## 2014-12-03 MED ORDER — DOXYCYCLINE HYCLATE 50 MG PO CAPS: 100.0000 mg | ORAL_CAPSULE | Freq: Two times a day (BID) | ORAL | Status: DC

## 2014-12-03 NOTE — Progress Notes (Signed)
Patient ID: [name], female   DOB: [birth year], 54 y.o.   MRN: [id number]  Test of cure: trichomonas, s/p metronidazole 500   Wet Prep:   A sample of vaginal discharge was obtained from the posterior fornix using a cotton swab. 2 drops of saline were placed on a slide and the cotton swab was immersed in the saline. Microscopic evaluation was performed and results were as follows:  Negative  for yeast  Negative for clue cells , consistent with Bacterial vaginosis Negative for trichomonas  very heavy WBC population   Whiff test: Negative  Cervicitis   Meds: Doxycycline 100 BID x 10 days  Follow up 3 weeks

## 2014-12-22 ENCOUNTER — Ambulatory Visit: Payer: Self-pay | Admitting: Internal Medicine

## 2014-12-24 ENCOUNTER — Ambulatory Visit: Payer: BLUE CROSS/BLUE SHIELD | Admitting: Obstetrics & Gynecology

## 2014-12-24 ENCOUNTER — Other Ambulatory Visit: Payer: Self-pay

## 2014-12-24 ENCOUNTER — Telehealth: Payer: Self-pay | Admitting: Internal Medicine

## 2014-12-24 ENCOUNTER — Telehealth: Payer: Self-pay

## 2014-12-24 MED ORDER — GLUCOSE BLOOD VI STRP: 1.0000 | ORAL_STRIP | Freq: Four times a day (QID) | Status: DC

## 2014-12-24 NOTE — Telephone Encounter (Signed)
Patient called requesting a refill on her test strips [facility]

## 2014-12-24 NOTE — Telephone Encounter (Signed)
Patient called requesting TRUETEST TEST test strip refills. Patient states she is completely out. Patient would like test strips sent [facility] on [address] In [place]  Please f/u with patient

## 2014-12-27 ENCOUNTER — Ambulatory Visit (INDEPENDENT_AMBULATORY_CARE_PROVIDER_SITE_OTHER): Payer: BLUE CROSS/BLUE SHIELD | Admitting: Obstetrics & Gynecology

## 2014-12-27 ENCOUNTER — Encounter: Payer: Self-pay | Admitting: Obstetrics & Gynecology

## 2014-12-27 VITALS — BP 100/60 | HR 80 | Wt 193.0 lb

## 2014-12-27 DIAGNOSIS — N760 Acute vaginitis: Secondary | ICD-10-CM

## 2014-12-27 NOTE — Progress Notes (Signed)
Patient ID: [name], female   DOB: [birth year], 54 y.o.   MRN: [id number]  Repeat wet prep due to vaginitis  Wet Prep:   A sample of vaginal discharge was obtained from the posterior fornix using a cotton swab. 2 drops of saline were placed on a slide and the cotton swab was immersed in the saline. Microscopic evaluation was performed and results were as follows:  Negative  for yeast  Negative for clue cells , consistent with Bacterial vaginosis Negative for trichomonas  Normal WBC population   Whiff test: Negative  Normal vaginal microenvironment  Follow up prn

## 2014-12-28 ENCOUNTER — Telehealth: Payer: Self-pay | Admitting: Internal Medicine

## 2014-12-28 MED ORDER — INSULIN ASPART 100 UNIT/ML FLEXPEN: 12.0000 [IU] | PEN_INJECTOR | Freq: Three times a day (TID) | SUBCUTANEOUS | Status: DC

## 2014-12-28 MED ORDER — LANTUS SOLOSTAR 100 UNIT/ML ~~LOC~~ SOPN: 36.0000 [IU] | PEN_INJECTOR | Freq: Every day | SUBCUTANEOUS | Status: DC

## 2014-12-28 NOTE — Telephone Encounter (Signed)
Sent first refill to wrong pharmacy. Resending.

## 2014-12-28 NOTE — Telephone Encounter (Signed)
[facility] in [place] can we call in her rx for novolog and lantus please

## 2015-01-03 ENCOUNTER — Other Ambulatory Visit: Payer: Self-pay | Admitting: Obstetrics & Gynecology

## 2015-01-18 ENCOUNTER — Telehealth: Payer: Self-pay | Admitting: Internal Medicine

## 2015-01-18 MED ORDER — SAXAGLIPTIN-METFORMIN ER 2.5-1000 MG PO TB24: 2.0000 | ORAL_TABLET | ORAL | Status: DC

## 2015-01-18 NOTE — Telephone Encounter (Signed)
Pt needs refill kombiglyze

## 2015-02-17 ENCOUNTER — Telehealth: Payer: Self-pay

## 2015-02-17 ENCOUNTER — Telehealth: Payer: Self-pay | Admitting: Internal Medicine

## 2015-02-17 MED ORDER — LOSARTAN POTASSIUM-HCTZ 100-12.5 MG PO TABS: 1.0000 | ORAL_TABLET | Freq: Every day | ORAL | Status: DC

## 2015-02-17 NOTE — Telephone Encounter (Signed)
Patient called requesting a refill on her losartan Patient has not been seen in office in a while A one month supply was sent to the pharmacy and patient is aware She needs to make an appointment to follow up here in the office

## 2015-02-17 NOTE — Telephone Encounter (Signed)
Patient called to request a med refill for losartan-hydrochlorothiazide (HYZAAR) 100-12.5 MG per tablet . Patient uses [facility] on [address] Patient also stated that she does not have any more medication. Please f/u

## 2015-02-23 ENCOUNTER — Encounter: Payer: Self-pay | Admitting: Internal Medicine

## 2015-02-23 ENCOUNTER — Ambulatory Visit: Payer: BLUE CROSS/BLUE SHIELD | Attending: Internal Medicine | Admitting: Internal Medicine

## 2015-02-23 VITALS — BP 140/70 | HR 65 | Temp 98.0°F | Resp 16 | Wt 194.6 lb

## 2015-02-23 DIAGNOSIS — N898 Other specified noninflammatory disorders of vagina: Secondary | ICD-10-CM | POA: Diagnosis not present

## 2015-02-23 DIAGNOSIS — M797 Fibromyalgia: Secondary | ICD-10-CM | POA: Diagnosis not present

## 2015-02-23 DIAGNOSIS — I10 Essential (primary) hypertension: Secondary | ICD-10-CM | POA: Diagnosis not present

## 2015-02-23 DIAGNOSIS — R0981 Nasal congestion: Secondary | ICD-10-CM | POA: Insufficient documentation

## 2015-02-23 DIAGNOSIS — Z794 Long term (current) use of insulin: Secondary | ICD-10-CM | POA: Diagnosis not present

## 2015-02-23 DIAGNOSIS — E119 Type 2 diabetes mellitus without complications: Secondary | ICD-10-CM | POA: Diagnosis present

## 2015-02-23 DIAGNOSIS — Z87891 Personal history of nicotine dependence: Secondary | ICD-10-CM | POA: Diagnosis not present

## 2015-02-23 DIAGNOSIS — M25561 Pain in right knee: Secondary | ICD-10-CM | POA: Diagnosis not present

## 2015-02-23 DIAGNOSIS — F419 Anxiety disorder, unspecified: Secondary | ICD-10-CM | POA: Insufficient documentation

## 2015-02-23 DIAGNOSIS — F329 Major depressive disorder, single episode, unspecified: Secondary | ICD-10-CM | POA: Diagnosis not present

## 2015-02-23 DIAGNOSIS — E785 Hyperlipidemia, unspecified: Secondary | ICD-10-CM | POA: Insufficient documentation

## 2015-02-23 DIAGNOSIS — Z7982 Long term (current) use of aspirin: Secondary | ICD-10-CM | POA: Diagnosis not present

## 2015-02-23 DIAGNOSIS — K219 Gastro-esophageal reflux disease without esophagitis: Secondary | ICD-10-CM | POA: Diagnosis not present

## 2015-02-23 DIAGNOSIS — Z79899 Other long term (current) drug therapy: Secondary | ICD-10-CM | POA: Diagnosis not present

## 2015-02-23 LAB — POCT URINALYSIS DIPSTICK
BILIRUBIN UA: NEGATIVE
Glucose, UA: 500
Ketones, UA: NEGATIVE
LEUKOCYTES UA: NEGATIVE
NITRITE UA: NEGATIVE
PH UA: 6.5
Protein, UA: NEGATIVE
RBC UA: NEGATIVE
SPEC GRAV UA: 1.02
UROBILINOGEN UA: 0.2

## 2015-02-23 LAB — POCT GLYCOSYLATED HEMOGLOBIN (HGB A1C): HEMOGLOBIN A1C: 8.5

## 2015-02-23 LAB — GLUCOSE, POCT (MANUAL RESULT ENTRY): POC Glucose: 307 mg/dl — AB (ref 70–99)

## 2015-02-23 MED ORDER — ATORVASTATIN CALCIUM 40 MG PO TABS: 40.0000 mg | ORAL_TABLET | Freq: Every day | ORAL | Status: DC

## 2015-02-23 MED ORDER — AMLODIPINE BESYLATE 2.5 MG PO TABS: 2.5000 mg | ORAL_TABLET | Freq: Every day | ORAL | Status: DC

## 2015-02-23 MED ORDER — LOSARTAN POTASSIUM-HCTZ 100-12.5 MG PO TABS: 1.0000 | ORAL_TABLET | Freq: Every day | ORAL | Status: DC

## 2015-02-23 MED ORDER — METRONIDAZOLE 500 MG PO TABS: 500.0000 mg | ORAL_TABLET | Freq: Two times a day (BID) | ORAL | Status: DC

## 2015-02-23 MED ORDER — FLUTICASONE PROPIONATE 50 MCG/ACT NA SUSP: 1.0000 | Freq: Every day | NASAL | Status: DC

## 2015-02-23 MED ORDER — FLUCONAZOLE 150 MG PO TABS: 150.0000 mg | ORAL_TABLET | Freq: Once | ORAL | Status: DC

## 2015-02-23 NOTE — Progress Notes (Signed)
Patient here for follow up on her diabetes and for medication refills Patient was told she needed an appointment in order to get her Medication refilled Patient also complains of right knee pain And has some discomfort after eating but states if she drinks a ginger Ale and burch the discomfort subsides Patient also complains of vaginal discharge with no odor

## 2015-02-23 NOTE — Patient Instructions (Signed)
Diabetes Mellitus and Food It is important for you to manage your blood sugar (glucose) level. Your blood glucose level can be greatly affected by what you eat. Eating healthier foods in the appropriate amounts throughout the day at about the same time each day will help you control your blood glucose level. It can also help slow or prevent worsening of your diabetes mellitus. Healthy eating may even help you improve the level of your blood pressure and reach or maintain a healthy weight.  HOW CAN FOOD AFFECT ME? Carbohydrates Carbohydrates affect your blood glucose level more than any other type of food. Your dietitian will help you determine how many carbohydrates to eat at each meal and teach you how to count carbohydrates. Counting carbohydrates is important to keep your blood glucose at a healthy level, especially if you are using insulin or taking certain medicines for diabetes mellitus. Alcohol Alcohol can cause sudden decreases in blood glucose (hypoglycemia), especially if you use insulin or take certain medicines for diabetes mellitus. Hypoglycemia can be a life-threatening condition. Symptoms of hypoglycemia (sleepiness, dizziness, and disorientation) are similar to symptoms of having too much alcohol.  If your health care provider has given you approval to drink alcohol, do so in moderation and use the following guidelines:  Women should not have more than one drink per day, and men should not have more than two drinks per day. One drink is equal to:  12 oz of beer.  5 oz of wine.  1 oz of hard liquor.  Do not drink on an empty stomach.  Keep yourself hydrated. Have water, diet soda, or unsweetened iced tea.  Regular soda, juice, and other mixers might contain a lot of carbohydrates and should be counted. WHAT FOODS ARE NOT RECOMMENDED? As you make food choices, it is important to remember that all foods are not the same. Some foods have fewer nutrients per serving than other  foods, even though they might have the same number of calories or carbohydrates. It is difficult to get your body what it needs when you eat foods with fewer nutrients. Examples of foods that you should avoid that are high in calories and carbohydrates but low in nutrients include:  Trans fats (most processed foods list trans fats on the Nutrition Facts label).  Regular soda.  Juice.  Candy.  Sweets, such as cake, pie, doughnuts, and cookies.  Fried foods. WHAT FOODS CAN I EAT? Have nutrient-rich foods, which will nourish your body and keep you healthy. The food you should eat also will depend on several factors, including:  The calories you need.  The medicines you take.  Your weight.  Your blood glucose level.  Your blood pressure level.  Your cholesterol level. You also should eat a variety of foods, including:  Protein, such as meat, poultry, fish, tofu, nuts, and seeds (lean animal proteins are best).  Fruits.  Vegetables.  Dairy products, such as milk, cheese, and yogurt (low fat is best).  Breads, grains, pasta, cereal, rice, and beans.  Fats such as olive oil, trans fat-free margarine, canola oil, avocado, and olives. DOES EVERYONE WITH DIABETES MELLITUS HAVE THE SAME MEAL PLAN? Because every person with diabetes mellitus is different, there is not one meal plan that works for everyone. It is very important that you meet with a dietitian who will help you create a meal plan that is just right for you. Document Released: 04/05/2005 Document Revised: 07/14/2013 Document Reviewed: 06/05/2013 ExitCare Patient Information 2015 [employer]. This   information is not intended to replace advice given to you by your health care provider. Make sure you discuss any questions you have with your health care provider. DASH Eating Plan DASH stands for "Dietary Approaches to Stop Hypertension." The DASH eating plan is a healthy eating plan that has been shown to reduce high  blood pressure (hypertension). Additional health benefits may include reducing the risk of type 2 diabetes mellitus, heart disease, and stroke. The DASH eating plan may also help with weight loss. WHAT DO I NEED TO KNOW ABOUT THE DASH EATING PLAN? For the DASH eating plan, you will follow these general guidelines:  Choose foods with a percent daily value for sodium of less than 5% (as listed on the food label).  Use salt-free seasonings or herbs instead of table salt or sea salt.  Check with your health care provider or pharmacist before using salt substitutes.  Eat lower-sodium products, often labeled as "lower sodium" or "no salt added."  Eat fresh foods.  Eat more vegetables, fruits, and low-fat dairy products.  Choose whole grains. Look for the word "whole" as the first word in the ingredient list.  Choose fish and skinless chicken or turkey more often than red meat. Limit fish, poultry, and meat to 6 oz (170 g) each day.  Limit sweets, desserts, sugars, and sugary drinks.  Choose heart-healthy fats.  Limit cheese to 1 oz (28 g) per day.  Eat more home-cooked food and less restaurant, buffet, and fast food.  Limit fried foods.  Cook foods using methods other than frying.  Limit canned vegetables. If you do use them, rinse them well to decrease the sodium.  When eating at a restaurant, ask that your food be prepared with less salt, or no salt if possible. WHAT FOODS CAN I EAT? Seek help from a dietitian for individual calorie needs. Grains Whole grain or whole wheat bread. Brown rice. Whole grain or whole wheat pasta. Quinoa, bulgur, and whole grain cereals. Low-sodium cereals. Corn or whole wheat flour tortillas. Whole grain cornbread. Whole grain crackers. Low-sodium crackers. Vegetables Fresh or frozen vegetables (raw, steamed, roasted, or grilled). Low-sodium or reduced-sodium tomato and vegetable juices. Low-sodium or reduced-sodium tomato sauce and paste. Low-sodium  or reduced-sodium canned vegetables.  Fruits All fresh, canned (in natural juice), or frozen fruits. Meat and Other Protein Products Ground beef (85% or leaner), grass-fed beef, or beef trimmed of fat. Skinless chicken or turkey. Ground chicken or turkey. Pork trimmed of fat. All fish and seafood. Eggs. Dried beans, peas, or lentils. Unsalted nuts and seeds. Unsalted canned beans. Dairy Low-fat dairy products, such as skim or 1% milk, 2% or reduced-fat cheeses, low-fat ricotta or cottage cheese, or plain low-fat yogurt. Low-sodium or reduced-sodium cheeses. Fats and Oils Tub margarines without trans fats. Light or reduced-fat mayonnaise and salad dressings (reduced sodium). Avocado. Safflower, olive, or canola oils. Natural peanut or almond butter. Other Unsalted popcorn and pretzels. The items listed above may not be a complete list of recommended foods or beverages. Contact your dietitian for more options. WHAT FOODS ARE NOT RECOMMENDED? Grains White bread. White pasta. White rice. Refined cornbread. Bagels and croissants. Crackers that contain trans fat. Vegetables Creamed or fried vegetables. Vegetables in a cheese sauce. Regular canned vegetables. Regular canned tomato sauce and paste. Regular tomato and vegetable juices. Fruits Dried fruits. Canned fruit in light or heavy syrup. Fruit juice. Meat and Other Protein Products Fatty cuts of meat. Ribs, chicken wings, bacon, sausage, bologna, salami, chitterlings, fatback, hot   dogs, bratwurst, and packaged luncheon meats. Salted nuts and seeds. Canned beans with salt. Dairy Whole or 2% milk, cream, half-and-half, and cream cheese. Whole-fat or sweetened yogurt. Full-fat cheeses or blue cheese. Nondairy creamers and whipped toppings. Processed cheese, cheese spreads, or cheese curds. Condiments Onion and garlic salt, seasoned salt, table salt, and sea salt. Canned and packaged gravies. Worcestershire sauce. Tartar sauce. Barbecue sauce.  Teriyaki sauce. Soy sauce, including reduced sodium. Steak sauce. Fish sauce. Oyster sauce. Cocktail sauce. Horseradish. Ketchup and mustard. Meat flavorings and tenderizers. Bouillon cubes. Hot sauce. Tabasco sauce. Marinades. Taco seasonings. Relishes. Fats and Oils Butter, stick margarine, lard, shortening, ghee, and bacon fat. Coconut, palm kernel, or palm oils. Regular salad dressings. Other Pickles and olives. Salted popcorn and pretzels. The items listed above may not be a complete list of foods and beverages to avoid. Contact your dietitian for more information. WHERE CAN I FIND MORE INFORMATION? National Heart, Lung, and Blood Institute: www.nhlbi.nih.gov/health/health-topics/topics/dash/ Document Released: 06/28/2011 Document Revised: 11/23/2013 Document Reviewed: 05/13/2013 ExitCare Patient Information 2015 [employer]. This information is not intended to replace advice given to you by your health care provider. Make sure you discuss any questions you have with your health care provider.  

## 2015-02-23 NOTE — Progress Notes (Signed)
MRN: [id number] Name: [name]  Sex: female Age: 54 y.o. DOB: 04-19-1961  Allergies: Review of patient's allergies indicates no known allergies.  Chief Complaint  Patient presents with  . Follow-up    HPI: Patient is 54 y.o. female who has to of diabetes, hypertension, hyperlipidemia, patient comes today for followup as per patient she needs refill on her medications, she is also following up with endocrinologist and is supposed to take Lantus 36 units, she reported that she is taking only 30 units at night as well as on NovoLog and taking oral hypoglycemics, she currently denies any hypoglycemic symptoms, has been having high blood sugar levels recently, her hemoglobin A1c has trended down, she also has history of right knee surgery and has some right knee pain currently following up with orthopedics and getting injections in the joint, she denies any recent fall or trauma, she also had symptoms of vaginal discharge, as per patient in the past she was treated with Flagyl which helped her with the symptoms currently denies any urinary symptoms denies any nausea vomiting.  Past Medical History  Diagnosis Date  . Diabetes mellitus     h/o dka  . Depression   . Constipation   . Fibromyalgia   . Anemia   . Anxiety   . Heart murmur     child  . Hypertension   . GERD (gastroesophageal reflux disease)     tums occ  . Arthritis     Past Surgical History  Procedure Laterality Date  . Knee surgery Right 99    screws  . Cystectomy  96    stomach  . Shoulder surgery  10    right rotator cuff  . Endometrial ablation  08  . Colonoscopy  02/26/2012    SLF: Internal hemorrhoids/TCS IN 10 YEARS WITH PROPOFOL  . Lumbar laminectomy/decompression microdiscectomy N/A 05/14/2013    Procedure: RIGHT L4-L5 DECOMPRESSION AND CYST REMOVAL;  Surgeon: Melina Schools, MD;  Location: [facility];  Service: Orthopedics;  Laterality: N/A;  . Lumbar percutaneous pedicle screw 1 level N/A  07/14/2014    Procedure: REMOVAL OF L5-S1 PEDICLE SCREWS,FUSION L5-S1, REINSERTION OF PEDICLE SCREWS ILIAC CREST BONE GRAFT;  Surgeon: Melina Schools, MD;  Location: [facility];  Service: Orthopedics;  Laterality: N/A;      Medication List       This list is accurate as of: 02/23/15  1:01 PM.  Always use your most recent med list.               amLODipine 2.5 MG tablet  Commonly known as:  NORVASC  Take 1 tablet (2.5 mg total) by mouth daily.     aspirin EC 81 MG tablet  Take 81 mg by mouth daily.     atorvastatin 40 MG tablet  Commonly known as:  LIPITOR  Take 1 tablet (40 mg total) by mouth at bedtime.     docusate sodium 100 MG capsule  Commonly known as:  COLACE  Take 1 capsule (100 mg total) by mouth 3 (three) times daily as needed for constipation.     doxycycline 50 MG capsule  Commonly known as:  VIBRAMYCIN  Take 2 capsules (100 mg total) by mouth 2 (two) times daily.     doxycycline 50 MG capsule  Commonly known as:  VIBRAMYCIN  Take 2 capsules (100 mg total) by mouth 2 (two) times daily.     etodolac 400 MG tablet  Commonly known as:  LODINE  Take 400  mg by mouth 2 (two) times daily.     fexofenadine 180 MG tablet  Commonly known as:  ALLEGRA  Take 180 mg by mouth daily.     fluconazole 150 MG tablet  Commonly known as:  DIFLUCAN  Take 1 tablet (150 mg total) by mouth once.     fluticasone 50 MCG/ACT nasal spray  Commonly known as:  FLONASE  Place 1 spray into both nostrils daily.     gabapentin 300 MG capsule  Commonly known as:  NEURONTIN  Take 1 capsule (300 mg total) by mouth 3 (three) times daily.     glucose blood test strip  Commonly known as:  TRUETEST TEST  1 each by Other route 4 (four) times daily.     ibuprofen 800 MG tablet  Commonly known as:  ADVIL,MOTRIN     insulin aspart 100 UNIT/ML FlexPen  Commonly known as:  NOVOLOG FLEXPEN  Inject 12-15 Units into the skin 3 (three) times daily with meals.     Insulin Pen Needle 32G X 4 MM  Misc  Commonly known as:  BD PEN NEEDLE NANO U/F  Use 4x a day     LANTUS SOLOSTAR 100 UNIT/ML Solostar Pen  Generic drug:  Insulin Glargine  Inject 36 Units into the skin at bedtime.     losartan-hydrochlorothiazide 100-12.5 MG per tablet  Commonly known as:  HYZAAR  Take 1 tablet by mouth daily.     meloxicam 15 MG tablet  Commonly known as:  MOBIC  Take 15 mg by mouth daily.     meloxicam 7.5 MG tablet  Commonly known as:  MOBIC  Take 1 tablet (7.5 mg total) by mouth daily.     methocarbamol 500 MG tablet  Commonly known as:  ROBAXIN  Take 1 tablet (500 mg total) by mouth 3 (three) times daily as needed for muscle spasms.     metoCLOPramide 10 MG tablet  Commonly known as:  REGLAN  Take 1 tablet (10 mg total) by mouth every 8 (eight) hours as needed (headache with nausea).     metroNIDAZOLE 500 MG tablet  Commonly known as:  FLAGYL  Take 1 tablet (500 mg total) by mouth 2 (two) times daily.     metroNIDAZOLE 500 MG tablet  Commonly known as:  FLAGYL  Take 1 tablet (500 mg total) by mouth 2 (two) times daily.     ondansetron 4 MG tablet  Commonly known as:  ZOFRAN  Take 1 tablet (4 mg total) by mouth every 8 (eight) hours as needed for nausea or vomiting.     oxyCODONE-acetaminophen 10-325 MG per tablet  Commonly known as:  PERCOCET  Take 1 tablet by mouth every 4 (four) hours as needed for pain.     Saxagliptin-Metformin 2.11-998 MG Tb24  Take 2 tablets by mouth every morning.     traZODone 100 MG tablet  Commonly known as:  DESYREL  Take 100 mg by mouth at bedtime as needed for sleep.        Meds ordered this encounter  Medications  . amLODipine (NORVASC) 2.5 MG tablet    Sig: Take 1 tablet (2.5 mg total) by mouth daily.    Dispense:  90 tablet    Refill:  3  . fluticasone (FLONASE) 50 MCG/ACT nasal spray    Sig: Place 1 spray into both nostrils daily.    Dispense:  16 g    Refill:  3  . losartan-hydrochlorothiazide (HYZAAR) 100-12.5 MG per tablet  Sig: Take 1 tablet by mouth daily.    Dispense:  30 tablet    Refill:  3  . fluconazole (DIFLUCAN) 150 MG tablet    Sig: Take 1 tablet (150 mg total) by mouth once.    Dispense:  1 tablet    Refill:  1  . metroNIDAZOLE (FLAGYL) 500 MG tablet    Sig: Take 1 tablet (500 mg total) by mouth 2 (two) times daily.    Dispense:  14 tablet    Refill:  0  . atorvastatin (LIPITOR) 40 MG tablet    Sig: Take 1 tablet (40 mg total) by mouth at bedtime.    Dispense:  90 tablet    Refill:  1    Immunization History  Administered Date(s) Administered  . Influenza Split 04/14/2012  . Influenza,inj,Quad PF,36+ Mos 07/15/2014  . Pneumococcal Polysaccharide-23 08/02/2009, 05/18/2013  . Tdap 05/18/2012    Family History  Problem Relation Age of Onset  . Hypertension Mother   . Cancer Mother   . Asthma Sister   . Asthma Brother   . Stomach cancer      distant relative on dad's side of family  . Colon cancer Neg Hx   . Diabetes Paternal Grandmother     History  Substance Use Topics  . Smoking status: Former Smoker -- 0.50 packs/day for 15 years    Types: Cigarettes    Quit date: 04/29/1991  . Smokeless tobacco: Never Used  . Alcohol Use: 0.6 oz/week    1 Glasses of wine per week     Comment: occ wine; not now    Review of Systems   As noted in HPI  Filed Vitals:   02/23/15 1257  BP: 140/70  Pulse:   Temp:   Resp:     Physical Exam  Physical Exam  Constitutional: No distress.  Eyes: EOM are normal. Pupils are equal, round, and reactive to light.  Cardiovascular: Normal rate and regular rhythm.   Pulmonary/Chest: Breath sounds normal. No respiratory distress. She has no wheezes. She has no rales.  Abdominal: Soft. She exhibits no distension. There is no tenderness. There is no rebound.  Musculoskeletal:  Right knee old surgical scar, minimal tenderness    Labs   Lab Results  Component Value Date   WBC 7.0 07/12/2014   HGB 11.7* 07/12/2014   HCT 36.8 07/12/2014    PLT 394 07/12/2014   GLUCOSE 325* 07/20/2014   CHOL 231* 07/20/2014   TRIG 136 07/20/2014   HDL 50 07/20/2014   LDLCALC 154* 07/20/2014   ALT 10 07/20/2014   AST 13 07/20/2014   NA 137 07/20/2014   K 4.6 07/20/2014   CL 99 07/20/2014   CREATININE 0.86 07/20/2014   BUN 10 07/20/2014   CO2 28 07/20/2014   TSH 0.909 12/08/2013   INR 0.9 09/03/2008   HGBA1C 8.50 02/23/2015    Lab Results  Component Value Date   HGBA1C 8.50 02/23/2015   HGBA1C 9.0* 10/07/2014   HGBA1C 10.2* 07/14/2014     Assessment and Plan  Type 2 diabetes mellitus without complication - Plan:  Results for orders placed or performed in visit on 02/23/15  Glucose (CBG)  Result Value Ref Range   POC Glucose 307.0 (A) 70 - 99 mg/dl  HgB A1c  Result Value Ref Range   Hemoglobin A1C 8.50   Urinalysis Dipstick  Result Value Ref Range   Color, UA yellow    Clarity, UA clear    Glucose, UA 500  Bilirubin, UA neg    Ketones, UA neg    Spec Grav, UA 1.020    Blood, UA neg    pH, UA 6.5    Protein, UA neg    Urobilinogen, UA 0.2    Nitrite, UA neg    Leukocytes, UA Negative Negative   Hemoglobin A1c is trending down, as per patient she is taking Lantus 30 units, she'll increase to 32 units for now continue with NovoLog and oral hypoglycemics, patient follow with her endocrinologist. Continue low carbohydrate diet.   Vaginal discharge - Plan: Urinalysis Dipstick is negative for infection,, metroNIDAZOLE (FLAGYL) 500 MG tablet, and Diflucan, patient follow with  her gynecologist.  Essential hypertension - Plan:advise patient for DASH diet, continue with  amLODipine (NORVASC) 2.5 MG tablet, losartan-hydrochlorothiazide (HYZAAR) 100-12.5 MG per tablet  Nasal congestion - Plan: fluticasone (FLONASE) 50 MCG/ACT nasal spray,   Hyperlipidemia Patient used to be on Lipitor in the past as per patient she is not taking, will resume her back on Lipitor 40 mg daily, check fasting lipid panel on the following  visit  Right knee pain Continue to follow with orthopedics.  Fasting blood work on the following visit.  Return in about 3 months (around 05/26/2015), or if symptoms worsen or fail to improve.   This note has been created with Surveyor, quantity. Any transcriptional errors are unintentional.    Lorayne Marek, MD

## 2015-03-10 ENCOUNTER — Ambulatory Visit: Payer: BLUE CROSS/BLUE SHIELD | Admitting: Obstetrics & Gynecology

## 2015-03-15 ENCOUNTER — Ambulatory Visit: Payer: BLUE CROSS/BLUE SHIELD | Admitting: Obstetrics & Gynecology

## 2015-03-15 ENCOUNTER — Encounter: Payer: Self-pay | Admitting: Obstetrics & Gynecology

## 2015-04-20 ENCOUNTER — Encounter: Payer: Self-pay | Admitting: Obstetrics & Gynecology

## 2015-04-20 ENCOUNTER — Ambulatory Visit (INDEPENDENT_AMBULATORY_CARE_PROVIDER_SITE_OTHER): Payer: BLUE CROSS/BLUE SHIELD | Admitting: Adult Health

## 2015-04-20 VITALS — BP 128/70 | HR 72 | Ht 66.0 in | Wt 189.4 lb

## 2015-04-20 DIAGNOSIS — N760 Acute vaginitis: Secondary | ICD-10-CM

## 2015-04-20 DIAGNOSIS — B369 Superficial mycosis, unspecified: Secondary | ICD-10-CM | POA: Diagnosis not present

## 2015-04-20 DIAGNOSIS — R102 Pelvic and perineal pain: Secondary | ICD-10-CM | POA: Diagnosis not present

## 2015-04-20 DIAGNOSIS — N898 Other specified noninflammatory disorders of vagina: Secondary | ICD-10-CM | POA: Diagnosis not present

## 2015-04-20 DIAGNOSIS — A499 Bacterial infection, unspecified: Secondary | ICD-10-CM | POA: Diagnosis not present

## 2015-04-20 DIAGNOSIS — B9689 Other specified bacterial agents as the cause of diseases classified elsewhere: Secondary | ICD-10-CM

## 2015-04-20 HISTORY — DX: Superficial mycosis, unspecified: B36.9

## 2015-04-20 HISTORY — DX: Other specified bacterial agents as the cause of diseases classified elsewhere: B96.89

## 2015-04-20 HISTORY — DX: Pelvic and perineal pain: R10.2

## 2015-04-20 HISTORY — DX: Other specified noninflammatory disorders of vagina: N89.8

## 2015-04-20 LAB — POCT WET PREP (WET MOUNT): WBC, Wet Prep HPF POC: POSITIVE

## 2015-04-20 MED ORDER — METRONIDAZOLE 500 MG PO TABS: 500.0000 mg | ORAL_TABLET | Freq: Two times a day (BID) | ORAL | Status: DC

## 2015-04-20 MED ORDER — NYSTATIN-TRIAMCINOLONE 100000-0.1 UNIT/GM-% EX CREA: 1.0000 "application " | TOPICAL_CREAM | Freq: Two times a day (BID) | CUTANEOUS | Status: AC

## 2015-04-20 NOTE — Patient Instructions (Signed)
Pelvic Pain Female pelvic pain can be caused by many different things and start from a variety of places. Pelvic pain refers to pain that is located in the lower half of the abdomen and between your hips. The pain may occur over a short period of time (acute) or may be reoccurring (chronic). The cause of pelvic pain may be related to disorders affecting the female reproductive organs (gynecologic), but it may also be related to the bladder, kidney stones, an intestinal complication, or muscle or skeletal problems. Getting help right away for pelvic pain is important, especially if there has been severe, sharp, or a sudden onset of unusual pain. It is also important to get help right away because some types of pelvic pain can be life threatening.  CAUSES  Below are only some of the causes of pelvic pain. The causes of pelvic pain can be in one of several categories.  Gynecologic. Pelvic inflammatory disease. Sexually transmitted infection. Ovarian cyst or a twisted ovarian ligament (ovarian torsion). Uterine lining that grows outside the uterus (endometriosis). Fibroids, cysts, or tumors. Ovulation. Pregnancy. Pregnancy that occurs outside the uterus (ectopic pregnancy). Miscarriage. Labor. Abruption of the placenta or ruptured uterus. Infection. Uterine infection (endometritis). Bladder infection. Diverticulitis. Miscarriage related to a uterine infection (septic abortion). Bladder. Inflammation of the bladder (cystitis). Kidney stone(s). Gastrointestinal. Constipation. Diverticulitis. Neurologic. Trauma. Feeling pelvic pain because of mental or emotional causes (psychosomatic). Cancers of the bowel or pelvis. EVALUATION  Your caregiver will want to take a careful history of your concerns. This includes recent changes in your health, a careful gynecologic history of your periods (menses), and a sexual history. Obtaining your family history and medical history is also important. Your  caregiver may suggest a pelvic exam. A pelvic exam will help identify the location and severity of the pain. It also helps in the evaluation of which organ system may be involved. In order to identify the cause of the pelvic pain and be properly treated, your caregiver may order tests. These tests may include:  A pregnancy test. Pelvic ultrasonography. An X-ray exam of the abdomen. A urinalysis or evaluation of vaginal discharge. Blood tests. HOME CARE INSTRUCTIONS  Only take over-the-counter or prescription medicines for pain, discomfort, or fever as directed by your caregiver.  Rest as directed by your caregiver.  Eat a balanced diet.  Drink enough fluids to make your urine clear or pale yellow, or as directed.  Avoid sexual intercourse if it causes pain.  Apply warm or cold compresses to the lower abdomen depending on which one helps the pain.  Avoid stressful situations.  Keep a journal of your pelvic pain. Write down when it started, where the pain is located, and if there are things that seem to be associated with the pain, such as food or your menstrual cycle. Follow up with your caregiver as directed.  SEEK MEDICAL CARE IF: Your medicine does not help your pain. You have abnormal vaginal discharge. SEEK IMMEDIATE MEDICAL CARE IF:  You have heavy bleeding from the vagina.  Your pelvic pain increases.  You feel light-headed or faint.  You have chills.  You have pain with urination or blood in your urine.  You have uncontrolled diarrhea or vomiting.  You have a fever or persistent symptoms for more than 3 days. You have a fever and your symptoms suddenly get worse.  You are being physically or sexually abused.  MAKE SURE YOU: Understand these instructions. Will watch your condition. Will get help if you  are not doing well or get worse. Document Released: 06/05/2004 Document Revised: 11/23/2013 Document Reviewed: 10/29/2011 [facility] Patient Information 2015  [place], [place]. This information is not intended to replace advice given to you by your health care provider. Make sure you discuss any questions you have with your health care provider. Bacterial Vaginosis Bacterial vaginosis is a vaginal infection that occurs when the normal balance of bacteria in the vagina is disrupted. It results from an overgrowth of certain bacteria. This is the most common vaginal infection in women of childbearing age. Treatment is important to prevent complications, especially in pregnant women, as it can cause a premature delivery. CAUSES  Bacterial vaginosis is caused by an increase in harmful bacteria that are normally present in smaller amounts in the vagina. Several different kinds of bacteria can cause bacterial vaginosis. However, the reason that the condition develops is not fully understood. RISK FACTORS Certain activities or behaviors can put you at an increased risk of developing bacterial vaginosis, including:  Having a new sex partner or multiple sex partners.  Douching.  Using an intrauterine device (IUD) for contraception. Women do not get bacterial vaginosis from toilet seats, bedding, swimming pools, or contact with objects around them. SIGNS AND SYMPTOMS  Some women with bacterial vaginosis have no signs or symptoms. Common symptoms include:  Grey vaginal discharge.  A fishlike odor with discharge, especially after sexual intercourse.  Itching or burning of the vagina and vulva.  Burning or pain with urination. DIAGNOSIS  Your health care provider will take a medical history and examine the vagina for signs of bacterial vaginosis. A sample of vaginal fluid may be taken. Your health care provider will look at this sample under a microscope to check for bacteria and abnormal cells. A vaginal pH test may also be done.  TREATMENT  Bacterial vaginosis may be treated with antibiotic medicines. These may be given in the form of a pill or a vaginal  cream. A second round of antibiotics may be prescribed if the condition comes back after treatment.  HOME CARE INSTRUCTIONS   Only take over-the-counter or prescription medicines as directed by your health care provider.  If antibiotic medicine was prescribed, take it as directed. Make sure you finish it even if you start to feel better.  Do not have sex until treatment is completed.  Tell all sexual partners that you have a vaginal infection. They should see their health care provider and be treated if they have problems, such as a mild rash or itching.  Practice safe sex by using condoms and only having one sex partner. SEEK MEDICAL CARE IF:   Your symptoms are not improving after 3 days of treatment.  You have increased discharge or pain.  You have a fever. MAKE SURE YOU:   Understand these instructions.  Will watch your condition.  Will get help right away if you are not doing well or get worse. FOR MORE INFORMATION  Centers for Disease Control and Prevention, Division of STD Prevention: AppraiserFraud.fi American Sexual Health Association (ASHA): www.ashastd.org  Document Released: 07/09/2005 Document Revised: 04/29/2013 Document Reviewed: 02/18/2013 [facility] Patient Information 2015 [place], [place]. This information is not intended to replace advice given to you by your health care provider. Make sure you discuss any questions you have with your health care provider. Return in 1 week for [place]

## 2015-04-20 NOTE — Progress Notes (Signed)
Subjective:     Patient ID: [name], female   DOB: [birth year], 54 y.o.   MRN: [id number]  HPI [name] is a 54 year old black female in complaining of pain in low abdomen and odor at navel.Has had back surgery and has issues with back now.  Review of Systems Patient denies any headaches, hearing loss, fatigue, blurred vision, shortness of breath, chest pain,  problems with bowel movements, urination, or intercourse. No joint pain or mood swings.See HPI for positives.  Reviewed past medical,surgical, social and family history. Reviewed medications and allergies.     Objective:   Physical Exam BP 128/70 mmHg  Pulse 72  Ht 5\' 6"  (1.676 m)  Wt 189 lb 6.4 oz (85.911 kg)  BMI 30.58 kg/m2   Skin warm and dry.Pelvic: external genitalia is normal in appearance no lesions, vagina: white,yellowish discharge with slight odor,urethra has no lesions or masses noted, cervix:smooth and bulbous, uterus: normal size, shape and contour, + tender, no masses felt, adnexa: no masses or tenderness noted. Bladder is non tender and no masses felt. Wet prep: + for clue cells and +WBCs. Has skin fungus in navel, with odor.  Assessment:     Vaginal discharge BV Skin fungus Pelvic pain    Plan:     Rx flagyl 500 mg 1 bid x 7 days, no alcohol, review handout on BV   Rx mytrex use 2-3 x daily prn Return in 1 week for gyn [place] Review handout on pelvic pain

## 2015-04-26 ENCOUNTER — Other Ambulatory Visit: Payer: BLUE CROSS/BLUE SHIELD

## 2015-04-27 ENCOUNTER — Other Ambulatory Visit: Payer: BLUE CROSS/BLUE SHIELD

## 2015-04-29 ENCOUNTER — Other Ambulatory Visit: Payer: BLUE CROSS/BLUE SHIELD

## 2015-05-02 ENCOUNTER — Other Ambulatory Visit: Payer: BLUE CROSS/BLUE SHIELD

## 2015-05-04 ENCOUNTER — Other Ambulatory Visit: Payer: BLUE CROSS/BLUE SHIELD

## 2015-05-04 ENCOUNTER — Ambulatory Visit (INDEPENDENT_AMBULATORY_CARE_PROVIDER_SITE_OTHER): Payer: BLUE CROSS/BLUE SHIELD

## 2015-05-04 DIAGNOSIS — R102 Pelvic and perineal pain: Secondary | ICD-10-CM | POA: Diagnosis not present

## 2015-05-04 NOTE — Progress Notes (Addendum)
PELVIC US TA/TV: normal anteverted uterus,EEC 3.17mm. Left ovary appears normal and mobile. Right ovary not visualized today.

## 2015-05-05 ENCOUNTER — Telehealth: Payer: Self-pay | Admitting: Adult Health

## 2015-05-05 NOTE — Telephone Encounter (Signed)
Pt aware [place] was normal

## 2015-05-06 ENCOUNTER — Other Ambulatory Visit: Payer: Self-pay | Admitting: *Deleted

## 2015-05-06 MED ORDER — SAXAGLIPTIN-METFORMIN ER 2.5-1000 MG PO TB24: 2.0000 | ORAL_TABLET | ORAL | Status: DC

## 2015-05-11 ENCOUNTER — Other Ambulatory Visit: Payer: Self-pay | Admitting: *Deleted

## 2015-05-11 NOTE — Telephone Encounter (Signed)
Opened encounter in error  

## 2015-05-20 ENCOUNTER — Encounter: Payer: Self-pay | Admitting: *Deleted

## 2015-05-20 ENCOUNTER — Ambulatory Visit: Payer: Self-pay | Admitting: Internal Medicine

## 2015-05-20 DIAGNOSIS — Z0289 Encounter for other administrative examinations: Secondary | ICD-10-CM

## 2015-05-24 ENCOUNTER — Other Ambulatory Visit: Payer: Self-pay | Admitting: *Deleted

## 2015-05-24 MED ORDER — INSULIN ASPART 100 UNIT/ML FLEXPEN: 12.0000 [IU] | PEN_INJECTOR | Freq: Three times a day (TID) | SUBCUTANEOUS | Status: DC

## 2015-06-01 NOTE — Telephone Encounter (Signed)
Called concerning apt. Date and time.  [address] [place]; [facility] [phone]

## 2015-06-20 ENCOUNTER — Telehealth: Payer: Self-pay | Admitting: Internal Medicine

## 2015-06-20 NOTE — Telephone Encounter (Signed)
Please read message and advise

## 2015-06-20 NOTE — Telephone Encounter (Signed)
Patient stated that her medication Kombiglyze 25/1000 is to expensive is there another alternative or do you have any discount cards, please advise

## 2015-06-21 ENCOUNTER — Telehealth: Payer: Self-pay | Admitting: *Deleted

## 2015-06-21 MED ORDER — METFORMIN HCL ER 500 MG PO TB24: 1000.0000 mg | ORAL_TABLET | Freq: Two times a day (BID) | ORAL | Status: DC

## 2015-06-21 NOTE — Telephone Encounter (Signed)
Called pt and advised her per Dr Arman Filter message below. Pt voiced understanding. Pt stated she will make an appt. Sent Metformin XR to the pharmacy.

## 2015-06-21 NOTE — Telephone Encounter (Signed)
I have not seen the patient in almost 9 months. She needs a new appointment before Christmas. Until then, let's send metformin extended-release 500 mg tablets to her pharmacy and advise her to start at 1 tablet twice a day for 2 days and then increase to 2 tablets twice a day. Stay just on this + her insulin regimen until I see her at next visit.

## 2015-06-21 NOTE — Telephone Encounter (Signed)
Pt called again asking if there is an alternative to the [facility] or a discount card. It is expensive and she cannot afford it. Please advise.

## 2015-07-06 ENCOUNTER — Other Ambulatory Visit: Payer: Self-pay | Admitting: Obstetrics & Gynecology

## 2015-07-06 MED ORDER — FLUCONAZOLE 150 MG PO TABS: 150.0000 mg | ORAL_TABLET | Freq: Once | ORAL | Status: DC

## 2015-08-01 ENCOUNTER — Telehealth: Payer: Self-pay | Admitting: Internal Medicine

## 2015-08-01 NOTE — Telephone Encounter (Signed)
Patient stated that the hope program will be faxing over a request for her medication Novalog pens and Lantus . To help her pay for it.

## 2015-08-05 ENCOUNTER — Telehealth: Payer: Self-pay | Admitting: Internal Medicine

## 2015-08-05 MED ORDER — LANTUS SOLOSTAR 100 UNIT/ML ~~LOC~~ SOPN: 36.0000 [IU] | PEN_INJECTOR | Freq: Every day | SUBCUTANEOUS | Status: DC

## 2015-08-05 MED ORDER — INSULIN ASPART 100 UNIT/ML FLEXPEN: 12.0000 [IU] | PEN_INJECTOR | Freq: Three times a day (TID) | SUBCUTANEOUS | Status: DC

## 2015-08-05 NOTE — Telephone Encounter (Signed)
Refill sent to pt's pharmacy. 

## 2015-08-05 NOTE — Telephone Encounter (Signed)
Patient is call ing back state she is totally out of her meds she need a refill of her medication Novalog pens and Lantus pens, please advise

## 2015-08-10 ENCOUNTER — Telehealth: Payer: Self-pay | Admitting: Internal Medicine

## 2015-08-12 NOTE — Telephone Encounter (Signed)
Did we receive the form from [place] in [place] office? That needs to be completed and mailed back to [facility] to the address on the form

## 2015-08-15 MED ORDER — LANTUS SOLOSTAR 100 UNIT/ML ~~LOC~~ SOPN: 36.0000 [IU] | PEN_INJECTOR | Freq: Every day | SUBCUTANEOUS | Status: DC

## 2015-08-15 MED ORDER — INSULIN ASPART 100 UNIT/ML FLEXPEN: 12.0000 [IU] | PEN_INJECTOR | Freq: Three times a day (TID) | SUBCUTANEOUS | Status: DC

## 2015-08-15 NOTE — Telephone Encounter (Signed)
Pt calling again for status of the paperwork we have the paperwork, Dr. Cruzita Lederer has completed her portion, the pt has a portion she needs to complete. Larene Beach will call and let her know

## 2015-08-15 NOTE — Telephone Encounter (Signed)
Forms completed and faxed along with rx for both medications/Lantus and Novolog.

## 2015-08-16 NOTE — Telephone Encounter (Signed)
Patient came this morning to pick up forms. Dawn (front office staff member) advised pt that they were faxed yesterday. Pt stated that they can't be faxed, but she was shown that they could be. Pt upset. Pt has an appt with Dr Cruzita Lederer tomorrow.

## 2015-08-17 ENCOUNTER — Ambulatory Visit (INDEPENDENT_AMBULATORY_CARE_PROVIDER_SITE_OTHER): Payer: BLUE CROSS/BLUE SHIELD | Admitting: Internal Medicine

## 2015-08-17 ENCOUNTER — Encounter: Payer: Self-pay | Admitting: Internal Medicine

## 2015-08-17 VITALS — BP 112/70 | HR 67 | Temp 98.2°F | Resp 12 | Wt 192.0 lb

## 2015-08-17 DIAGNOSIS — E119 Type 2 diabetes mellitus without complications: Secondary | ICD-10-CM | POA: Diagnosis not present

## 2015-08-17 DIAGNOSIS — Z794 Long term (current) use of insulin: Secondary | ICD-10-CM | POA: Diagnosis not present

## 2015-08-17 LAB — HEMOGLOBIN A1C: Hgb A1c MFr Bld: 9.8 % — ABNORMAL HIGH (ref 4.6–6.5)

## 2015-08-17 LAB — LIPID PANEL
CHOL/HDL RATIO: 3
Cholesterol: 219 mg/dL — ABNORMAL HIGH (ref 0–200)
HDL: 79.9 mg/dL (ref >39.00)
LDL Cholesterol: 127 mg/dL — ABNORMAL HIGH (ref 0–99)
NONHDL: 139.55
Triglycerides: 61 mg/dL (ref 0.0–149.0)
VLDL: 12.2 mg/dL (ref 0.0–40.0)

## 2015-08-17 LAB — COMPLETE METABOLIC PANEL WITH GFR
ALBUMIN: 3.7 g/dL (ref 3.6–5.1)
ALK PHOS: 90 U/L (ref 33–130)
ALT: 9 U/L (ref 6–29)
AST: 11 U/L (ref 10–35)
BILIRUBIN TOTAL: 0.2 mg/dL (ref 0.2–1.2)
BUN: 9 mg/dL (ref 7–25)
CO2: 28 mmol/L (ref 20–31)
CREATININE: 0.6 mg/dL (ref 0.50–1.05)
Calcium: 9.2 mg/dL (ref 8.6–10.4)
Chloride: 103 mmol/L (ref 98–110)
GFR, Est Non African American: >89 mL/min (ref >=60)
GLUCOSE: 143 mg/dL — AB (ref 65–99)
Potassium: 4.2 mmol/L (ref 3.5–5.3)
SODIUM: 141 mmol/L (ref 135–146)
TOTAL PROTEIN: 6.2 g/dL (ref 6.1–8.1)

## 2015-08-17 NOTE — Patient Instructions (Signed)
Please stay on Lantus 20 at bedtime. Split Metformin to 500 mg 3-4x a day, with a meal. Continue NovoLog: - ICR 1:5 Sliding scale:  - 150-175: + 1 unit  - 176-200: + 2 units  - 201-225: + 3 units  - 226-250: + 4 units  - >250: + 5 units  Please check if the following meds are covered: - Tradjenta - Onglyza - Nesina - Januvia  Please return in 3 months with your sugar log.   Please stop at the lab.

## 2015-08-17 NOTE — Progress Notes (Signed)
Patient ID: [name], female   DOB: Oct 13, 1960, 55 y.o.   MRN: [id number]  [name] is a 55 y.o.-year-old female, returning for f/u for DM2, dx in 2001, previously GDM in 1996, non-insulin-dependent, uncontrolled, with complications (2x DKA episodes, last in 11/2013 - Glu 696, CO2 12). Last visit 10 mo ago. She moved to [facility].   Last hemoglobin A1c was: Lab Results  Component Value Date   HGBA1C 8.50 02/23/2015   HGBA1C 9.0* 10/07/2014   HGBA1C 10.2* 07/14/2014  She gets steroid inj in knee >> last was 06/2014. She gets them q3-4 mo. Last inj (knee and hip) in yesterday. Sugars in the 200s since the inj.  She is on: - Lantus 30 >> 15-20 (rarely 30) units daily at bedtime. - Metformin 1000 2x a day. - Novolog pen - ICR 1:5 + add a NovoLog Sliding scale: (>> ends up with 12-15 units) - 150-175: + 1 unit  - 176-200: + 2 units  - 201-225: + 3 units  - 226-250: + 4 units  - >250: + 5 units We had to stop Kombiglyze and this helped (but 11-06/2015: 300-400s) Used to be on Lantus 2 years ago >> lost insurance >> Novolog 70/30.  Pt checks her sugars 4x: - am: 90-140 >> 57-181  >> 40 x1, 42 x1,78x1, 90-120 >> 60, 120-175 - 2h after b'fast: n/c >> 46 (activity), 53, 111-228 >> n/c - before lunch: 200-260 >> 116-210 >> 113, 140-160, 180 >> 135 - 2h after lunch: n/c >> 40 x1, 104-176, 222 >> 70x1 >> n/c - before dinner: low 100s >> 41-160, 190 >> 49x1, 160-180 >> 150-160 - 2h after dinner: n/c >> 103-172 >> n/c - bedtime: low 200s >> 51, 74, 179-254 >> 110-289 >> 130-180 - nighttime: n/c >> 156, 172 >> n/c She has lows mostly at night or in am. Lowest sugar was 55 (in am) >> 40 x1 >> 60s; she has hypoglycemia awareness at 100.  Highest sugar was 320s >> 300s  Glucometer: ReliOn   Saw nutrition 09/09/2014.  She changed her diet since last visit >> smaller portions, fruit and veggies.   - no CKD, last BUN/creatinine:  Lab Results  Component Value  Date   BUN 10 07/20/2014   CREATININE 0.86 07/20/2014  On Losartan 100. - last set of lipids: Lab Results  Component Value Date   CHOL 231* 07/20/2014   HDL 50 07/20/2014   LDLCALC 154* 07/20/2014   TRIG 136 07/20/2014   CHOLHDL 4.6 07/20/2014  On Lipitor 40. - last eye exam was in 08/2014. No DR.  - no numbness and tingling in her feet.  She had 2 back surgeries: 04/2013 and 07/14/2014.   ROS: Constitutional: no weight loss or gain, no fatigue, + hot flushes Eyes: no blurry vision, no xerophthalmia ENT: no sore throat, no nodules palpated in throat, no dysphagia/odynophagia, no hoarseness Cardiovascular: no CP/SOB/palpitations/leg swelling Respiratory: no cough/no SOB Gastrointestinal: no N/V/D/C Musculoskeletal: no muscle aches/joint aches Skin: no rashes Neurological: no tremors/numbness/tingling/dizziness  I reviewed pt's medications, allergies, PMH, social hx, family hx, and changes were documented in the history of present illness. Otherwise, unchanged from my initial visit note: Past Medical History  Diagnosis Date  . Diabetes mellitus     h/o dka  . Depression   . Constipation   . Fibromyalgia   . Anemia   . Anxiety   . Heart murmur     child  . Hypertension   . GERD (  gastroesophageal reflux disease)     tums occ  . Arthritis   . Vaginal discharge 04/20/2015  . BV (bacterial vaginosis) 04/20/2015  . Superficial fungus infection of skin 04/20/2015  . Pelvic pain in female 04/20/2015   Past Surgical History  Procedure Laterality Date  . Knee surgery Right 99    screws  . Cystectomy  96    stomach  . Shoulder surgery  10    right rotator cuff  . Endometrial ablation  08  . Colonoscopy  02/26/2012    SLF: Internal hemorrhoids/TCS IN 10 YEARS WITH PROPOFOL  . Lumbar laminectomy/decompression microdiscectomy N/A 05/14/2013    Procedure: RIGHT L4-L5 DECOMPRESSION AND CYST REMOVAL;  Surgeon: Melina Schools, MD;  Location: [place];  Service: Orthopedics;   Laterality: N/A;  . Lumbar percutaneous pedicle screw 1 level N/A 07/14/2014    Procedure: REMOVAL OF L5-S1 PEDICLE SCREWS,FUSION L5-S1, REINSERTION OF PEDICLE SCREWS ILIAC CREST BONE GRAFT;  Surgeon: Melina Schools, MD;  Location: [place];  Service: Orthopedics;  Laterality: N/A;   History   Social History  . Marital Status: Divorced    Spouse Name: N/A    Number of Children: 4   Occupational History  . [employer]   Social History Main Topics  . Smoking status: Former Smoker -- 0.50 packs/day for 15 years    Types: Cigarettes    Quit date: 04/29/1991  . Smokeless tobacco: Never Used  . Alcohol Use: 0.6 oz/week    1 Glasses of wine per week     Comment: occ wine; not now  . Drug Use: No     Comment: crack/cocaine 20 yrs ago   Current Outpatient Prescriptions on File Prior to Visit  Medication Sig Dispense Refill  . amLODipine (NORVASC) 2.5 MG tablet Take 1 tablet (2.5 mg total) by mouth daily. 90 tablet 3  . aspirin EC 81 MG tablet Take 81 mg by mouth daily.     Marland Kitchen atorvastatin (LIPITOR) 40 MG tablet Take 1 tablet (40 mg total) by mouth at bedtime. 90 tablet 1  . docusate sodium (COLACE) 100 MG capsule Take 1 capsule (100 mg total) by mouth 3 (three) times daily as needed for constipation. 30 capsule 0  . etodolac (LODINE) 400 MG tablet Take 400 mg by mouth 2 (two) times daily.   0  . fexofenadine (ALLEGRA) 180 MG tablet Take 180 mg by mouth daily.     . fluticasone (FLONASE) 50 MCG/ACT nasal spray Place 1 spray into both nostrils daily. 16 g 3  . gabapentin (NEURONTIN) 300 MG capsule Take 1 capsule (300 mg total) by mouth 3 (three) times daily. 90 capsule 0  . glucose blood (TRUETEST TEST) test strip 1 each by Other route 4 (four) times daily. 100 each 12  . ibuprofen (ADVIL,MOTRIN) 800 MG tablet   0  . insulin aspart (NOVOLOG FLEXPEN) 100 UNIT/ML FlexPen Inject 12-15 Units into the skin 3 (three) times daily with meals. 45 mL 3  . Insulin Pen Needle (BD PEN NEEDLE NANO U/F) 32G X 4  MM MISC Use 4x a day 200 each 11  . LANTUS SOLOSTAR 100 UNIT/ML Solostar Pen Inject 36 Units into the skin at bedtime. 30 mL 3  . losartan-hydrochlorothiazide (HYZAAR) 100-12.5 MG per tablet Take 1 tablet by mouth daily. 30 tablet 3  . meloxicam (MOBIC) 15 MG tablet Take 15 mg by mouth daily.   0  . metFORMIN (GLUCOPHAGE XR) 500 MG 24 hr tablet Take 2 tablets (1,000 mg total) by  mouth 2 (two) times daily with a meal. 120 tablet 1  . methocarbamol (ROBAXIN) 500 MG tablet Take 1 tablet (500 mg total) by mouth 3 (three) times daily as needed for muscle spasms. 60 tablet 0  . metoCLOPramide (REGLAN) 10 MG tablet Take 1 tablet (10 mg total) by mouth every 8 (eight) hours as needed (headache with nausea). 20 tablet 0  . nystatin-triamcinolone (MYCOLOG II) cream Apply 1 application topically 2 (two) times daily. 30 g 0  . ondansetron (ZOFRAN) 4 MG tablet Take 1 tablet (4 mg total) by mouth every 8 (eight) hours as needed for nausea or vomiting. 20 tablet 0  . traZODone (DESYREL) 100 MG tablet Take 100 mg by mouth at bedtime as needed for sleep.      No current facility-administered medications on file prior to visit.   No Known Allergies Family History  Problem Relation Age of Onset  . Hypertension Mother   . Cancer Mother   . Asthma Sister   . Asthma Brother   . Stomach cancer      distant relative on dad's side of family  . Colon cancer Neg Hx   . Diabetes Paternal Grandmother    PE: BP 112/70 mmHg  Pulse 67  Temp(Src) 98.2 F (36.8 C) (Oral)  Resp 12  Wt 192 lb (87.091 kg)  SpO2 98% Body mass index is 31 kg/(m^2). Wt Readings from Last 3 Encounters:  08/17/15 192 lb (87.091 kg)  04/20/15 189 lb 6.4 oz (85.911 kg)  02/23/15 194 lb 9.6 oz (88.27 kg)   Constitutional: overweight, in NAD Eyes: PERRLA, EOMI, no exophthalmos ENT: moist mucous membranes, no thyromegaly, no cervical lymphadenopathy Cardiovascular: RRR, No MRG Respiratory: CTA B Gastrointestinal: abdomen soft, NT, ND,  BS+ Musculoskeletal: no deformities, strength intact in all 4 Skin: moist, warm, no rashes Neurological: no tremor with outstretched hands, DTR normal in all 4  ASSESSMENT: 1. 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 Patient with long-standing, uncontrolled diabetes, returning after a long absence.She started to change her diet since last visit and started to be more active. She is feeling better! Her sugars increased during the Holidays as she had to come off [place] (we started Metformin ER only) >> now started to improve. I advised her to try to find out if a DPP4 inh is covered by insurance in the new year and I also advised her not to vary the Lantus dose so much. Also, as she is not tolerating Metformin very well >> will split the doses throughout the day more.  Patient Instructions  Please stay on Lantus 20 at bedtime. Split Metformin to 500 mg 3-4x a day, with a meal. Continue NovoLog: - ICR 1:5 Sliding scale:  - 150-175: + 1 unit  - 176-200: + 2 units  - 201-225: + 3 units  - 226-250: + 4 units  - >250: + 5 units  Please check if the following meds are covered: - Tradjenta - Onglyza - Nesina - Januvia  Please return in 3 months with your sugar log.   Please stop at the lab.  - continue checking sugars at different times of the day - check 4 times a day, rotating checks - advised for yearly eye exams >> she is UTD - Will check a hemoglobin A1c today along with a CMP and Lipids - Return to clinic in 3 mo with sugar log   Office Visit on 08/17/2015  Component Date  Value Ref Range Status  . Sodium 08/17/2015 141  135 - 146 mmol/L Final  . Potassium 08/17/2015 4.2  3.5 - 5.3 mmol/L Final  . Chloride 08/17/2015 103  98 - 110 mmol/L Final  . CO2 08/17/2015 28  20 - 31 mmol/L Final  . Glucose, Bld 08/17/2015 143* 65 - 99 mg/dL Final  . BUN 08/17/2015 9  7 - 25 mg/dL Final  . Creat 08/17/2015  0.60  0.50 - 1.05 mg/dL Final  . Total Bilirubin 08/17/2015 0.2  0.2 - 1.2 mg/dL Final  . Alkaline Phosphatase 08/17/2015 90  33 - 130 U/L Final  . AST 08/17/2015 11  10 - 35 U/L Final  . ALT 08/17/2015 9  6 - 29 U/L Final  . Total Protein 08/17/2015 6.2  6.1 - 8.1 g/dL Final  . Albumin 08/17/2015 3.7  3.6 - 5.1 g/dL Final  . Calcium 08/17/2015 9.2  8.6 - 10.4 mg/dL Final  . GFR, Est African American 08/17/2015 >89  >=60 mL/min Final  . GFR, Est Non African American 08/17/2015 >89  >=60 mL/min Final   Comment:   The estimated GFR is a calculation valid for adults (>=48 years old) that uses the CKD-EPI algorithm to adjust for age and sex. It is   not to be used for children, pregnant women, hospitalized patients,    patients on dialysis, or with rapidly changing kidney function. According to the NKDEP, eGFR >89 is normal, 60-89 shows mild impairment, 30-59 shows moderate impairment, 15-29 shows severe impairment and <15 is ESRD.     Marland Kitchen Cholesterol 08/17/2015 219* 0 - 200 mg/dL Final   ATP III Classification       Desirable:  < 200 mg/dL               Borderline High:  200 - 239 mg/dL          High:  > = 240 mg/dL  . Triglycerides 08/17/2015 61.0  0.0 - 149.0 mg/dL Final   Normal:  <150 mg/dLBorderline High:  150 - 199 mg/dL  . HDL 08/17/2015 79.90  >39.00 mg/dL Final  . VLDL 08/17/2015 12.2  0.0 - 40.0 mg/dL Final  . LDL Cholesterol 08/17/2015 127* 0 - 99 mg/dL Final  . Total CHOL/HDL Ratio 08/17/2015 3   Final                  Men          Women1/2 Average Risk     3.4          3.3Average Risk          5.0          4.42X Average Risk          9.6          7.13X Average Risk          15.0          11.0                      . NonHDL 08/17/2015 139.55   Final   NOTE:  Non-HDL goal should be 30 mg/dL higher than patient's LDL goal (i.e. LDL goal of < 70 mg/dL, would have non-HDL goal of < 100 mg/dL)  . Hgb A1c MFr Bld 08/17/2015 9.8* 4.6 - 6.5 % Final   Glycemic Control Guidelines for  People with Diabetes:Non Diabetic:  <6%Goal of Therapy: <7%Additional Action Suggested:  >8%    Hemoglobin A1c  is higher, otherwise labs are improved.her A1c could be high due to her previous very high blood sugars over the holidays. As of now, they have greatly improved. I believe her next hemoglobin A1c will be much better.

## 2015-09-05 ENCOUNTER — Other Ambulatory Visit: Payer: Self-pay | Admitting: Internal Medicine

## 2015-09-09 ENCOUNTER — Telehealth: Payer: Self-pay

## 2015-09-09 ENCOUNTER — Telehealth: Payer: Self-pay | Admitting: Internal Medicine

## 2015-09-09 DIAGNOSIS — I10 Essential (primary) hypertension: Secondary | ICD-10-CM

## 2015-09-09 MED ORDER — MELOXICAM 15 MG PO TABS: 15.0000 mg | ORAL_TABLET | Freq: Every day | ORAL | Status: AC

## 2015-09-09 MED ORDER — LOSARTAN POTASSIUM-HCTZ 100-12.5 MG PO TABS: 1.0000 | ORAL_TABLET | Freq: Every day | ORAL | Status: DC

## 2015-09-09 NOTE — Telephone Encounter (Signed)
Returned call to patient  Patient requesting a refill on her losartan and mobic Two week supply sent If patient is no show for he appointment -no more refills

## 2015-09-09 NOTE — Telephone Encounter (Signed)
Pt. Is requesting medication refills for losartan and mobic.Marland KitchenMarland KitchenMarland KitchenMarland Kitchenpatient has not been seen since August of last year....  Pt. Has appointment to re-establish care 09/21/15.....  Please follow up with patient

## 2015-09-14 ENCOUNTER — Ambulatory Visit: Payer: Self-pay | Admitting: Internal Medicine

## 2015-09-16 DIAGNOSIS — M65342 Trigger finger, left ring finger: Secondary | ICD-10-CM | POA: Diagnosis not present

## 2015-09-21 ENCOUNTER — Ambulatory Visit: Payer: Self-pay | Admitting: Family Medicine

## 2015-09-21 DIAGNOSIS — M1712 Unilateral primary osteoarthritis, left knee: Secondary | ICD-10-CM | POA: Diagnosis not present

## 2015-09-21 DIAGNOSIS — M170 Bilateral primary osteoarthritis of knee: Secondary | ICD-10-CM | POA: Diagnosis not present

## 2015-09-21 DIAGNOSIS — M1711 Unilateral primary osteoarthritis, right knee: Secondary | ICD-10-CM | POA: Diagnosis not present

## 2015-09-22 DIAGNOSIS — M533 Sacrococcygeal disorders, not elsewhere classified: Secondary | ICD-10-CM | POA: Diagnosis not present

## 2015-09-29 DIAGNOSIS — M1712 Unilateral primary osteoarthritis, left knee: Secondary | ICD-10-CM | POA: Diagnosis not present

## 2015-10-04 ENCOUNTER — Other Ambulatory Visit: Payer: Self-pay | Admitting: *Deleted

## 2015-10-04 MED ORDER — FLUCONAZOLE 150 MG PO TABS: ORAL_TABLET | ORAL | Status: DC

## 2015-10-05 DIAGNOSIS — M1712 Unilateral primary osteoarthritis, left knee: Secondary | ICD-10-CM | POA: Diagnosis not present

## 2015-10-13 DIAGNOSIS — H1132 Conjunctival hemorrhage, left eye: Secondary | ICD-10-CM | POA: Diagnosis not present

## 2015-10-13 DIAGNOSIS — M65342 Trigger finger, left ring finger: Secondary | ICD-10-CM | POA: Diagnosis not present

## 2015-10-19 ENCOUNTER — Ambulatory Visit: Payer: Self-pay | Admitting: Family Medicine

## 2015-10-31 DIAGNOSIS — Z4789 Encounter for other orthopedic aftercare: Secondary | ICD-10-CM | POA: Diagnosis not present

## 2015-11-02 DIAGNOSIS — M1712 Unilateral primary osteoarthritis, left knee: Secondary | ICD-10-CM | POA: Diagnosis not present

## 2015-11-07 ENCOUNTER — Ambulatory Visit (INDEPENDENT_AMBULATORY_CARE_PROVIDER_SITE_OTHER): Payer: BLUE CROSS/BLUE SHIELD | Admitting: Obstetrics & Gynecology

## 2015-11-07 ENCOUNTER — Encounter: Payer: Self-pay | Admitting: Obstetrics & Gynecology

## 2015-11-07 ENCOUNTER — Other Ambulatory Visit (HOSPITAL_COMMUNITY)
Admission: RE | Admit: 2015-11-07 | Discharge: 2015-11-07 | Disposition: A | Discharge status: Home / Self Care | Payer: BLUE CROSS/BLUE SHIELD | Source: Ambulatory Visit | Attending: Obstetrics & Gynecology | Admitting: Obstetrics & Gynecology

## 2015-11-07 VITALS — BP 120/70 | HR 72 | Ht 65.0 in

## 2015-11-07 DIAGNOSIS — Z01419 Encounter for gynecological examination (general) (routine) without abnormal findings: Secondary | ICD-10-CM | POA: Insufficient documentation

## 2015-11-07 DIAGNOSIS — Z01411 Encounter for gynecological examination (general) (routine) with abnormal findings: Secondary | ICD-10-CM | POA: Diagnosis not present

## 2015-11-07 DIAGNOSIS — B9689 Other specified bacterial agents as the cause of diseases classified elsewhere: Secondary | ICD-10-CM

## 2015-11-07 DIAGNOSIS — R102 Pelvic and perineal pain: Secondary | ICD-10-CM

## 2015-11-07 DIAGNOSIS — A499 Bacterial infection, unspecified: Secondary | ICD-10-CM | POA: Diagnosis not present

## 2015-11-07 DIAGNOSIS — Z1212 Encounter for screening for malignant neoplasm of rectum: Secondary | ICD-10-CM | POA: Diagnosis not present

## 2015-11-07 DIAGNOSIS — N760 Acute vaginitis: Secondary | ICD-10-CM

## 2015-11-07 DIAGNOSIS — Z1211 Encounter for screening for malignant neoplasm of colon: Secondary | ICD-10-CM

## 2015-11-07 MED ORDER — FLUCONAZOLE 150 MG PO TABS: 150.0000 mg | ORAL_TABLET | Freq: Once | ORAL | Status: DC

## 2015-11-07 MED ORDER — METRONIDAZOLE 500 MG PO TABS: 500.0000 mg | ORAL_TABLET | Freq: Two times a day (BID) | ORAL | Status: DC

## 2015-11-07 NOTE — Progress Notes (Signed)
Patient ID: [name], female   DOB: 1961/06/23, 55 y.o.   MRN: [id number] Subjective:     [name] is a 55 y.o. female here for a routine exam.  No LMP recorded. Patient has had an ablation. [id number] Birth Control Method:  Post menopausal Menstrual Calendar(currently): ablation  Current complaints: some occasional lower pelvic pain.   Current acute medical issues:  diabetes   Recent Gynecologic History No LMP recorded. Patient has had an ablation. Last Pap: 2016,  normal Last mammogram: 2016,  normal  Past Medical History  Diagnosis Date  . Diabetes mellitus     h/o dka  . Depression   . Constipation   . Fibromyalgia   . Anemia   . Anxiety   . Heart murmur     child  . Hypertension   . GERD (gastroesophageal reflux disease)     tums occ  . Arthritis   . Vaginal discharge 04/20/2015  . BV (bacterial vaginosis) 04/20/2015  . Superficial fungus infection of skin 04/20/2015  . Pelvic pain in female 04/20/2015    Past Surgical History  Procedure Laterality Date  . Knee surgery Right 99    screws  . Cystectomy  96    stomach  . Shoulder surgery  10    right rotator cuff  . Endometrial ablation  08  . Colonoscopy  02/26/2012    SLF: Internal hemorrhoids/TCS IN 10 YEARS WITH PROPOFOL  . Lumbar laminectomy/decompression microdiscectomy N/A 05/14/2013    Procedure: RIGHT L4-L5 DECOMPRESSION AND CYST REMOVAL;  Surgeon: Melina Schools, MD;  Location: [facility];  Service: Orthopedics;  Laterality: N/A;  . Lumbar percutaneous pedicle screw 1 level N/A 07/14/2014    Procedure: REMOVAL OF L5-S1 PEDICLE SCREWS,FUSION L5-S1, REINSERTION OF PEDICLE SCREWS ILIAC CREST BONE GRAFT;  Surgeon: Melina Schools, MD;  Location: [facility];  Service: Orthopedics;  Laterality: N/A;    OB History    Gravida Para Term Preterm AB TAB SAB Ectopic Multiple Living   5 4 4  1  1   4       Social History   Social History  . Marital Status: Divorced    Spouse Name: N/A  . Number of  Children: N/A  . Years of Education: N/A   Social History Main Topics  . Smoking status: Former Smoker -- 0.50 packs/day for 15 years    Types: Cigarettes    Quit date: 04/29/1991  . Smokeless tobacco: Never Used  . Alcohol Use: 0.6 oz/week    1 Glasses of wine per week     Comment: occ wine; not now  . Drug Use: No     Comment: crack/cocaine 20 yrs ago  . Sexual Activity: No   Other Topics Concern  . None   Social History Narrative    Family History  Problem Relation Age of Onset  . Hypertension Mother   . Cancer Mother   . Asthma Sister   . Asthma Brother   . Stomach cancer      distant relative on dad's side of family  . Colon cancer Neg Hx   . Diabetes Paternal Grandmother      Current outpatient prescriptions:  .  amLODipine (NORVASC) 2.5 MG tablet, Take 1 tablet (2.5 mg total) by mouth daily., Disp: 90 tablet, Rfl: 3 .  aspirin EC 81 MG tablet, Take 81 mg by mouth daily. , Disp: , Rfl:  .  atorvastatin (LIPITOR) 40 MG tablet, Take 1 tablet (40 mg total) by mouth  at bedtime., Disp: 90 tablet, Rfl: 1 .  docusate sodium (COLACE) 100 MG capsule, Take 1 capsule (100 mg total) by mouth 3 (three) times daily as needed for constipation., Disp: 30 capsule, Rfl: 0 .  etodolac (LODINE) 400 MG tablet, Take 400 mg by mouth 2 (two) times daily. , Disp: , Rfl: 0 .  fexofenadine (ALLEGRA) 180 MG tablet, Take 180 mg by mouth daily. , Disp: , Rfl:  .  fluconazole (DIFLUCAN) 150 MG tablet, Take 1 today and repeat in 3 days, Disp: 2 tablet, Rfl: 1 .  fluticasone (FLONASE) 50 MCG/ACT nasal spray, Place 1 spray into both nostrils daily., Disp: 16 g, Rfl: 3 .  gabapentin (NEURONTIN) 300 MG capsule, Take 1 capsule (300 mg total) by mouth 3 (three) times daily., Disp: 90 capsule, Rfl: 0 .  glucose blood (TRUETEST TEST) test strip, 1 each by Other route 4 (four) times daily., Disp: 100 each, Rfl: 12 .  ibuprofen (ADVIL,MOTRIN) 800 MG tablet, , Disp: , Rfl: 0 .  insulin aspart (NOVOLOG  FLEXPEN) 100 UNIT/ML FlexPen, Inject 12-15 Units into the skin 3 (three) times daily with meals., Disp: 45 mL, Rfl: 3 .  Insulin Pen Needle (BD PEN NEEDLE NANO U/F) 32G X 4 MM MISC, Use 4x a day, Disp: 200 each, Rfl: 11 .  LANTUS SOLOSTAR 100 UNIT/ML Solostar Pen, Inject 36 Units into the skin at bedtime., Disp: 30 mL, Rfl: 3 .  losartan-hydrochlorothiazide (HYZAAR) 100-12.5 MG tablet, Take 1 tablet by mouth daily., Disp: 15 tablet, Rfl: 0 .  meloxicam (MOBIC) 15 MG tablet, Take 1 tablet (15 mg total) by mouth daily., Disp: 15 tablet, Rfl: 0 .  metFORMIN (GLUCOPHAGE-XR) 500 MG 24 hr tablet, take 2 tablet by mouth twice a day with meals, Disp: 120 tablet, Rfl: 2 .  metoCLOPramide (REGLAN) 10 MG tablet, Take 1 tablet (10 mg total) by mouth every 8 (eight) hours as needed (headache with nausea)., Disp: 20 tablet, Rfl: 0 .  nystatin-triamcinolone (MYCOLOG II) cream, Apply 1 application topically 2 (two) times daily., Disp: 30 g, Rfl: 0 .  ondansetron (ZOFRAN) 4 MG tablet, Take 1 tablet (4 mg total) by mouth every 8 (eight) hours as needed for nausea or vomiting., Disp: 20 tablet, Rfl: 0 .  traZODone (DESYREL) 100 MG tablet, Take 100 mg by mouth at bedtime as needed for sleep. , Disp: , Rfl:  .  metroNIDAZOLE (FLAGYL) 500 MG tablet, Take 1 tablet (500 mg total) by mouth 2 (two) times daily., Disp: 14 tablet, Rfl: 0  Review of Systems  Review of Systems  Constitutional: Negative for fever, chills, weight loss, malaise/fatigue and diaphoresis.  HENT: Negative for hearing loss, ear pain, nosebleeds, congestion, sore throat, neck pain, tinnitus and ear discharge.   Eyes: Negative for blurred vision, double vision, photophobia, pain, discharge and redness.  Respiratory: Negative for cough, hemoptysis, sputum production, shortness of breath, wheezing and stridor.   Cardiovascular: Negative for chest pain, palpitations, orthopnea, claudication, leg swelling and PND.  Gastrointestinal: negative for abdominal  pain. Negative for heartburn, nausea, vomiting, diarrhea, constipation, blood in stool and melena.  Genitourinary: Negative for dysuria, urgency, frequency, hematuria and flank pain.  Musculoskeletal: Negative for myalgias, back pain, joint pain and falls.  Skin: Negative for itching and rash.  Neurological: Negative for dizziness, tingling, tremors, sensory change, speech change, focal weakness, seizures, loss of consciousness, weakness and headaches.  Endo/Heme/Allergies: Negative for environmental allergies and polydipsia. Does not bruise/bleed easily.  Psychiatric/Behavioral: Negative for depression, suicidal ideas, hallucinations,  memory loss and substance abuse. The patient is not nervous/anxious and does not have insomnia.        Objective:  Blood pressure 120/70, pulse 72, height 5\' 5"  (1.651 m).   Physical Exam  Vitals reviewed. Constitutional: She is oriented to person, place, and time. She appears well-developed and well-nourished.  HENT:  Head: Normocephalic and atraumatic.        Right Ear: External ear normal.  Left Ear: External ear normal.  Nose: Nose normal.  Mouth/Throat: Oropharynx is clear and moist.  Eyes: Conjunctivae and EOM are normal. Pupils are equal, round, and reactive to light. Right eye exhibits no discharge. Left eye exhibits no discharge. No scleral icterus.  Neck: Normal range of motion. Neck supple. No tracheal deviation present. No thyromegaly present.  Cardiovascular: Normal rate, regular rhythm, normal heart sounds and intact distal pulses.  Exam reveals no gallop and no friction rub.   No murmur heard. Respiratory: Effort normal and breath sounds normal. No respiratory distress. She has no wheezes. She has no rales. She exhibits no tenderness.  GI: Soft. Bowel sounds are normal. She exhibits no distension and no mass. There is no tenderness. There is no rebound and no guarding.  Genitourinary:  Breasts no masses skin changes or nipple changes  bilaterally      Vulva is normal without lesions Vagina is pink moist without discharge Cervix normal in appearance and pap is done Uterus is normal size shape and contour some midline and adnexal pain on exam Adnexa is negative with normal sized ovaries  {Rectal    hemoccult negative, normal tone, no masses  Musculoskeletal: Normal range of motion. She exhibits no edema and no tenderness.  Neurological: She is alert and oriented to person, place, and time. She has normal reflexes. She displays normal reflexes. No cranial nerve deficit. She exhibits normal muscle tone. Coordination normal.  Skin: Skin is warm and dry. No rash noted. No erythema. No pallor.  Psychiatric: She has a normal mood and affect. Her behavior is normal. Judgment and thought content normal.       Medications Ordered at today's visit: Meds ordered this encounter  Medications  . metroNIDAZOLE (FLAGYL) 500 MG tablet    Sig: Take 1 tablet (500 mg total) by mouth 2 (two) times daily.    Dispense:  14 tablet    Refill:  0    Other orders placed at today's visit: Orders Placed This Encounter  Procedures  . US Pelvis Complete  . US Transvaginal Non-OB      Assessment:    Healthy female exam.    Plan:    Mammogram ordered. Follow up in: 2 weeks.

## 2015-11-07 NOTE — Addendum Note (Signed)
Addended by: Florian Buff on: 11/07/2015 03:54 PM   Modules accepted: Orders

## 2015-11-10 DIAGNOSIS — M1712 Unilateral primary osteoarthritis, left knee: Secondary | ICD-10-CM | POA: Diagnosis not present

## 2015-11-10 LAB — CYTOLOGY - PAP

## 2015-11-16 ENCOUNTER — Ambulatory Visit (INDEPENDENT_AMBULATORY_CARE_PROVIDER_SITE_OTHER): Payer: BLUE CROSS/BLUE SHIELD | Admitting: Internal Medicine

## 2015-11-16 ENCOUNTER — Encounter: Payer: Self-pay | Admitting: Internal Medicine

## 2015-11-16 VITALS — BP 120/72 | HR 73 | Temp 97.9°F | Resp 12 | Wt 189.6 lb

## 2015-11-16 DIAGNOSIS — Z794 Long term (current) use of insulin: Secondary | ICD-10-CM

## 2015-11-16 DIAGNOSIS — E119 Type 2 diabetes mellitus without complications: Secondary | ICD-10-CM

## 2015-11-16 DIAGNOSIS — M1712 Unilateral primary osteoarthritis, left knee: Secondary | ICD-10-CM | POA: Diagnosis not present

## 2015-11-16 LAB — HEMOGLOBIN A1C: HEMOGLOBIN A1C: 10.3 % — AB (ref 4.6–6.5)

## 2015-11-16 MED ORDER — EXENATIDE ER 2 MG ~~LOC~~ PEN: 2.0000 mg | PEN_INJECTOR | SUBCUTANEOUS | Status: DC

## 2015-11-16 NOTE — Progress Notes (Signed)
Patient ID: [name], female   DOB: 1960-10-29, 55 y.o.   MRN: [id number]  HPI: [name] is a 55 y.o.-year-old female, returning for f/u for DM2, dx in 2001, previously GDM in 1996, insulin-dependent, uncontrolled, with complications (2x DKA episodes, last in 11/2013 - Glu 696, CO2 12). Last visit 3 mo ago.  She had 3 steroid injections since last visit >> sugars much higher (even 400s), now coming down in last week.  Last hemoglobin A1c was: Lab Results  Component Value Date   HGBA1C 9.8* 08/17/2015   HGBA1C 8.50 02/23/2015   HGBA1C 9.0* 10/07/2014  She gets steroid inj in knee >> last was 06/2014. She gets them q3-4 mo. Last inj (knee and hip) in yesterday. Sugars in the 200s since the inj.  She is on: - Lantus 40 units daily at bedtime. - Metformin 500 4x a day. - Novolog pen 12 units - 150-175: + 1 unit  - 176-200: + 2 units  - 201-225: + 3 units  - 226-250: + 4 units  - >250: + 5 units We had to stop Kombiglyze. Used to be on Novolog 70/30 b/c lost insurance, now back on Lantus.  Pt checks her sugars 4x: - am: 90-140 >> 57-181  >> 40 x1, 42 x1,78x1, 90-120 >> 60, 120-175 >> 140s - 2h after b'fast: n/c >> 46 (activity), 53, 111-228 >> n/c - before lunch: 200-260 >> 116-210 >> 113, 140-160, 180 >> 135 >> 90s, 200 - 2h after lunch: n/c >> 40 x1, 104-176, 222 >> 70x1 >> n/c - before dinner: low 100s >> 41-160, 190 >> 49x1, 160-180 >> 150-160 >> n/c - 2h after dinner: n/c >> 103-172 >> n/c - bedtime: low 200s >> 51, 74, 179-254 >> 110-289 >> 130-180 >> 160s - nighttime: n/c >> 156, 172 >> n/c She has lows mostly at night or in am. Lowest sugar was 55 (in am) >> 40 x1 >> 60s >> 90; she has hypoglycemia awareness at 100.  Highest sugar was 320s >> 300s >> 400 x1 (steroids).    Glucometer: ReliOn   Saw nutrition 09/09/2014.  She changed her diet since last visit >> smaller portions, fruit and veggies.   - no CKD, last BUN/creatinine:  Lab Results   Component Value Date   BUN 9 08/17/2015   CREATININE 0.60 08/17/2015  On Losartan 100. - last set of lipids: Lab Results  Component Value Date   CHOL 219* 08/17/2015   HDL 79.90 08/17/2015   LDLCALC 127* 08/17/2015   TRIG 61.0 08/17/2015   CHOLHDL 3 08/17/2015  On Lipitor 40. - last eye exam was in 08/2015. No DR.  - no numbness and tingling in her feet.  She had 2 back surgeries: 04/2013 and 07/14/2014.   No h/o pancreatitis or Medullary ThyCa.  ROS: Constitutional: no weight loss or gain, no fatigue Eyes: no blurry vision, no xerophthalmia ENT: no sore throat, no nodules palpated in throat, no dysphagia/odynophagia, no hoarseness Cardiovascular: no CP/SOB/palpitations/leg swelling Respiratory: no cough/no SOB Gastrointestinal: no N/V/D/C Musculoskeletal: no muscle aches/joint aches Skin: no rashes Neurological: no tremors/numbness/tingling/dizziness  I reviewed pt's medications, allergies, PMH, social hx, family hx, and changes were documented in the history of present illness. Otherwise, unchanged from my initial visit note: Past Medical History  Diagnosis Date  . Diabetes mellitus     h/o dka  . Depression   . Constipation   . Fibromyalgia   . Anemia   . Anxiety   . Heart murmur  child  . Hypertension   . GERD (gastroesophageal reflux disease)     tums occ  . Arthritis   . Vaginal discharge 04/20/2015  . BV (bacterial vaginosis) 04/20/2015  . Superficial fungus infection of skin 04/20/2015  . Pelvic pain in female 04/20/2015   Past Surgical History  Procedure Laterality Date  . Knee surgery Right 99    screws  . Cystectomy  96    stomach  . Shoulder surgery  10    right rotator cuff  . Endometrial ablation  08  . Colonoscopy  02/26/2012    SLF: Internal hemorrhoids/TCS IN 10 YEARS WITH PROPOFOL  . Lumbar laminectomy/decompression microdiscectomy N/A 05/14/2013    Procedure: RIGHT L4-L5 DECOMPRESSION AND CYST REMOVAL;  Surgeon: Melina Schools, MD;   Location: [facility];  Service: Orthopedics;  Laterality: N/A;  . Lumbar percutaneous pedicle screw 1 level N/A 07/14/2014    Procedure: REMOVAL OF L5-S1 PEDICLE SCREWS,FUSION L5-S1, REINSERTION OF PEDICLE SCREWS ILIAC CREST BONE GRAFT;  Surgeon: Melina Schools, MD;  Location: [facility];  Service: Orthopedics;  Laterality: N/A;   History   Social History  . Marital Status: Divorced    Spouse Name: N/A    Number of Children: 4   Occupational History  . [employer]   Social History Main Topics  . Smoking status: Former Smoker -- 0.50 packs/day for 15 years    Types: Cigarettes    Quit date: 04/29/1991  . Smokeless tobacco: Never Used  . Alcohol Use: 0.6 oz/week    1 Glasses of wine per week     Comment: occ wine; not now  . Drug Use: No     Comment: crack/cocaine 20 yrs ago   Current Outpatient Prescriptions on File Prior to Visit  Medication Sig Dispense Refill  . amLODipine (NORVASC) 2.5 MG tablet Take 1 tablet (2.5 mg total) by mouth daily. 90 tablet 3  . aspirin EC 81 MG tablet Take 81 mg by mouth daily.     Marland Kitchen atorvastatin (LIPITOR) 40 MG tablet Take 1 tablet (40 mg total) by mouth at bedtime. 90 tablet 1  . docusate sodium (COLACE) 100 MG capsule Take 1 capsule (100 mg total) by mouth 3 (three) times daily as needed for constipation. 30 capsule 0  . etodolac (LODINE) 400 MG tablet Take 400 mg by mouth 2 (two) times daily.   0  . fexofenadine (ALLEGRA) 180 MG tablet Take 180 mg by mouth daily.     . fluconazole (DIFLUCAN) 150 MG tablet Take 1 today and repeat in 3 days 2 tablet 1  . fluconazole (DIFLUCAN) 150 MG tablet Take 1 tablet (150 mg total) by mouth once. Take the second tablet 3 days after the first one. 2 tablet 0  . fluticasone (FLONASE) 50 MCG/ACT nasal spray Place 1 spray into both nostrils daily. 16 g 3  . gabapentin (NEURONTIN) 300 MG capsule Take 1 capsule (300 mg total) by mouth 3 (three) times daily. 90 capsule 0  . glucose blood (TRUETEST TEST) test strip 1 each by Other  route 4 (four) times daily. 100 each 12  . ibuprofen (ADVIL,MOTRIN) 800 MG tablet   0  . insulin aspart (NOVOLOG FLEXPEN) 100 UNIT/ML FlexPen Inject 12-15 Units into the skin 3 (three) times daily with meals. 45 mL 3  . Insulin Pen Needle (BD PEN NEEDLE NANO U/F) 32G X 4 MM MISC Use 4x a day 200 each 11  . LANTUS SOLOSTAR 100 UNIT/ML Solostar Pen Inject 36 Units into the skin  at bedtime. 30 mL 3  . losartan-hydrochlorothiazide (HYZAAR) 100-12.5 MG tablet Take 1 tablet by mouth daily. 15 tablet 0  . meloxicam (MOBIC) 15 MG tablet Take 1 tablet (15 mg total) by mouth daily. 15 tablet 0  . metFORMIN (GLUCOPHAGE-XR) 500 MG 24 hr tablet take 2 tablet by mouth twice a day with meals 120 tablet 2  . metoCLOPramide (REGLAN) 10 MG tablet Take 1 tablet (10 mg total) by mouth every 8 (eight) hours as needed (headache with nausea). 20 tablet 0  . metroNIDAZOLE (FLAGYL) 500 MG tablet Take 1 tablet (500 mg total) by mouth 2 (two) times daily. 14 tablet 0  . nystatin-triamcinolone (MYCOLOG II) cream Apply 1 application topically 2 (two) times daily. 30 g 0  . ondansetron (ZOFRAN) 4 MG tablet Take 1 tablet (4 mg total) by mouth every 8 (eight) hours as needed for nausea or vomiting. 20 tablet 0  . traZODone (DESYREL) 100 MG tablet Take 100 mg by mouth at bedtime as needed for sleep.      No current facility-administered medications on file prior to visit.   No Known Allergies Family History  Problem Relation Age of Onset  . Hypertension Mother   . Cancer Mother   . Asthma Sister   . Asthma Brother   . Stomach cancer      distant relative on dad's side of family  . Colon cancer Neg Hx   . Diabetes Paternal Grandmother    PE: BP 120/72 mmHg  Pulse 73  Temp(Src) 97.9 F (36.6 C) (Oral)  Resp 12  Wt 189 lb 9.6 oz (86.002 kg)  SpO2 97% Body mass index is 31.55 kg/(m^2). Wt Readings from Last 3 Encounters:  11/16/15 189 lb 9.6 oz (86.002 kg)  08/17/15 192 lb (87.091 kg)  04/20/15 189 lb 6.4 oz  (85.911 kg)   Constitutional: overweight, in NAD Eyes: PERRLA, EOMI, no exophthalmos ENT: moist mucous membranes, no thyromegaly, no cervical lymphadenopathy Cardiovascular: RRR, No MRG Respiratory: CTA B Gastrointestinal: abdomen soft, NT, ND, BS+ Musculoskeletal: no deformities, strength intact in all 4 Skin: moist, warm, no rashes Neurological: no tremor with outstretched hands, DTR normal in all 4  ASSESSMENT: 1. 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 Patient with long-standing, uncontrolled diabetes, with worse sugars since last visit 2/2 steroids. Now they started to decrease. She would like to try Bydureon, which I think is a great idea. I explained the mech. Of action and expected benefits. Patient Instructions  Please continue: - Lantus 40 units at bedtime - Novolog 12 units before meals + Sliding scale: - 150-175: + 1 unit  - 176-200: + 2 units  - 201-225: + 3 units  - 226-250: + 4 units  - >250: + 5 units  Please add: - Bydureon 2 mg once a week.  Please return in 1.5 months with your sugar log.   - continue checking sugars at different times of the day - check 3 times a day, rotating checks - advised for yearly eye exams >> she is UTD - Will check a hemoglobin A1c today  - Return to clinic in 1.5 mo with sugar log   Office Visit on 11/16/2015  Component Date Value Ref Range Status  . Hgb A1c MFr Bld 11/16/2015 10.3* 4.6 - 6.5 % Final   Glycemic Control Guidelines for People with Diabetes:Non Diabetic:  <6%Goal of Therapy: <7%Additional Action Suggested:  >8%    HbA1c higher likely  2/2 steroid use.

## 2015-11-16 NOTE — Patient Instructions (Addendum)
Please continue: - Lantus 40 units at bedtime - Novolog 12 units before meals + Sliding scale: - 150-175: + 1 unit  - 176-200: + 2 units  - 201-225: + 3 units  - 226-250: + 4 units  - >250: + 5 units  Please add: - Bydureon 2 mg once a week.  Please return in 1.5 months with your sugar log.

## 2015-11-21 ENCOUNTER — Encounter: Payer: Self-pay | Admitting: Obstetrics & Gynecology

## 2015-11-21 ENCOUNTER — Telehealth: Payer: Self-pay | Admitting: Internal Medicine

## 2015-11-21 ENCOUNTER — Ambulatory Visit: Payer: BLUE CROSS/BLUE SHIELD | Admitting: Obstetrics & Gynecology

## 2015-11-21 ENCOUNTER — Other Ambulatory Visit: Payer: BLUE CROSS/BLUE SHIELD

## 2015-11-21 NOTE — Telephone Encounter (Signed)
Yes, let's start a PA.

## 2015-11-21 NOTE — Telephone Encounter (Signed)
Please read message below. I am assuming she is talking about the Bydureon. Please advise.

## 2015-11-21 NOTE — Telephone Encounter (Signed)
PT said that her pharmacy needs a PA for a medication that was sent in and that they haven't heard back from [place], (pt did not know medication name) also would like to know if her insulin has came in yet.

## 2015-11-23 ENCOUNTER — Other Ambulatory Visit: Payer: Self-pay | Admitting: *Deleted

## 2015-11-23 MED ORDER — DULAGLUTIDE 0.75 MG/0.5ML ~~LOC~~ SOAJ: 0.7500 mg | SUBCUTANEOUS | Status: DC

## 2015-11-23 NOTE — Telephone Encounter (Signed)
Ins does not cover Bydureon. Switching to Trulicity per Dr Cruzita Lederer.

## 2015-11-23 NOTE — Telephone Encounter (Signed)
Pt just calling to see if we received her meds from PA - let her know not yet  Pt is requesting a call back about the PA status

## 2015-11-25 ENCOUNTER — Ambulatory Visit (INDEPENDENT_AMBULATORY_CARE_PROVIDER_SITE_OTHER): Payer: BLUE CROSS/BLUE SHIELD

## 2015-11-25 ENCOUNTER — Ambulatory Visit (INDEPENDENT_AMBULATORY_CARE_PROVIDER_SITE_OTHER): Payer: BLUE CROSS/BLUE SHIELD | Admitting: Obstetrics & Gynecology

## 2015-11-25 ENCOUNTER — Encounter: Payer: Self-pay | Admitting: Obstetrics & Gynecology

## 2015-11-25 VITALS — BP 120/70 | HR 72 | Wt 190.0 lb

## 2015-11-25 DIAGNOSIS — R102 Pelvic and perineal pain: Secondary | ICD-10-CM

## 2015-11-25 NOTE — Progress Notes (Signed)
PELVIC US TA/TV: homogenous anteverted uterus,normal ov's bilat (mobile),EEC 3.4 mm,no free fluid seen,lt adnexal pain during ultrasound

## 2015-11-25 NOTE — Progress Notes (Signed)
Patient ID: [name], female   DOB: Dec 03, 1960, 55 y.o.   MRN: [id number] Follow up appointment for results  Chief Complaint  Patient presents with  . Follow-up    ultrasound    Blood pressure 120/70, pulse 72, weight 190 lb (86.183 kg).  US Transvaginal Non-ob  11/25/2015  GYNECOLOGIC SONOGRAM [name] is a 55 y.o. [id number] s/p ablation, she is here for a pelvic sonogram for pelvic pain. Uterus                      9.3 x 5.9 x 4.3 cm, homogenous anteverted uterus Endometrium          3.4 mm, symmetrical, wnl Right ovary             2.8 x 1.7 x 1.5 cm, wnl Left ovary                3.2 x 2.5 x 1.7 cm, wnl No free fluid Technician Comments: PELVIC US TA/TV: homogenous anteverted uterus,normal ov's bilat (mobile),EEC 3.4 mm,no free fluid seen,lt adnexal pain during ultrasound [employer] 11/25/2015 1:05 PM Clinical Impression and recommendations: I have reviewed the sonogram results above, combined with the patient's current clinical course, below are my impressions and any appropriate recommendations for management based on the sonographic findings. Normal uterus and endometrium, no hematometra Ovaries are normal [name],[name] 11/25/2015 1:35 PM   US Pelvis Complete  11/25/2015  GYNECOLOGIC SONOGRAM [name] is a 55 y.o. [id number] s/p ablation, she is here for a pelvic sonogram for pelvic pain. Uterus                      9.3 x 5.9 x 4.3 cm, homogenous anteverted uterus Endometrium          3.4 mm, symmetrical, wnl Right ovary             2.8 x 1.7 x 1.5 cm, wnl Left ovary                3.2 x 2.5 x 1.7 cm, wnl No free fluid Technician Comments: PELVIC US TA/TV: homogenous anteverted uterus,normal ov's bilat (mobile),EEC 3.4 mm,no free fluid seen,lt adnexal pain during ultrasound [employer] 11/25/2015 1:05 PM Clinical Impression and recommendations: I have reviewed the sonogram results above, combined with the patient's current clinical course, below are my impressions and  any appropriate recommendations for management based on the sonographic findings. Normal uterus and endometrium, no hematometra Ovaries are normal [name],[name] 11/25/2015 1:35 PM    No hematometra  MEDS ordered this encounter: No orders of the defined types were placed in this encounter.    Orders for this encounter: No orders of the defined types were placed in this encounter.    Plan:  Follow Up:     Face to face time:  10 minutes  Greater than 50% of the visit time was spent in counseling and coordination of care with the patient.  The summary and outline of the counseling and care coordination is summarized in the note above.   All questions were answered.  Past Medical History  Diagnosis Date  . Diabetes mellitus     Mcdowell/o dka  . Depression   . Constipation   . Fibromyalgia   . Anemia   . Anxiety   . Heart murmur     child  . Hypertension   . GERD (gastroesophageal reflux disease)     tums  occ  . Arthritis   . Vaginal discharge 04/20/2015  . BV (bacterial vaginosis) 04/20/2015  . Superficial fungus infection of skin 04/20/2015  . Pelvic pain in female 04/20/2015    Past Surgical History  Procedure Laterality Date  . Knee surgery Right 99    screws  . Cystectomy  96    stomach  . Shoulder surgery  10    right rotator cuff  . Endometrial ablation  08  . Colonoscopy  02/26/2012    SLF: Internal hemorrhoids/TCS IN 10 YEARS WITH PROPOFOL  . Lumbar laminectomy/decompression microdiscectomy N/A 05/14/2013    Procedure: RIGHT L4-L5 DECOMPRESSION AND CYST REMOVAL;  Surgeon: Melina Schools, MD;  Location: [facility];  Service: Orthopedics;  Laterality: N/A;  . Lumbar percutaneous pedicle screw 1 level N/A 07/14/2014    Procedure: REMOVAL OF L5-S1 PEDICLE SCREWS,FUSION L5-S1, REINSERTION OF PEDICLE SCREWS ILIAC CREST BONE GRAFT;  Surgeon: Melina Schools, MD;  Location: [facility];  Service: Orthopedics;  Laterality: N/A;    OB History    Gravida Para Term Preterm AB TAB SAB  Ectopic Multiple Living   5 4 4  1  1   4       No Known Allergies  Social History   Social History  . Marital Status: Divorced    Spouse Name: N/A  . Number of Children: N/A  . Years of Education: N/A   Social History Main Topics  . Smoking status: Former Smoker -- 0.50 packs/day for 15 years    Types: Cigarettes    Quit date: 04/29/1991  . Smokeless tobacco: Never Used  . Alcohol Use: 0.6 oz/week    1 Glasses of wine per week     Comment: occ wine; not now  . Drug Use: No     Comment: crack/cocaine 20 yrs ago  . Sexual Activity: No   Other Topics Concern  . None   Social History Narrative    Family History  Problem Relation Age of Onset  . Hypertension Mother   . Cancer Mother   . Asthma Sister   . Asthma Brother   . Stomach cancer      distant relative on dad's side of family  . Colon cancer Neg Hx   . Diabetes Paternal Grandmother

## 2015-11-30 ENCOUNTER — Telehealth: Payer: Self-pay | Admitting: Internal Medicine

## 2015-11-30 NOTE — Telephone Encounter (Signed)
Patient is returning your call.  

## 2015-11-30 NOTE — Telephone Encounter (Signed)
Called pt to advise her that we have not heard from patient assistance concerning her medication, but her mailbox was full; unable to lvm.

## 2015-12-07 DIAGNOSIS — M65342 Trigger finger, left ring finger: Secondary | ICD-10-CM | POA: Diagnosis not present

## 2015-12-07 DIAGNOSIS — M65842 Other synovitis and tenosynovitis, left hand: Secondary | ICD-10-CM | POA: Diagnosis not present

## 2015-12-27 ENCOUNTER — Ambulatory Visit: Payer: Self-pay | Admitting: Internal Medicine

## 2015-12-27 DIAGNOSIS — Z0289 Encounter for other administrative examinations: Secondary | ICD-10-CM

## 2015-12-28 ENCOUNTER — Telehealth: Payer: Self-pay | Admitting: Internal Medicine

## 2015-12-28 ENCOUNTER — Other Ambulatory Visit: Payer: Self-pay

## 2015-12-28 MED ORDER — DULAGLUTIDE 0.75 MG/0.5ML ~~LOC~~ SOAJ: 0.7500 mg | SUBCUTANEOUS | Status: DC

## 2015-12-28 NOTE — Telephone Encounter (Signed)
Pt is asking if we have been trying to get a PA for the pen we are trying to rx for her-she is asking the status of this?

## 2015-12-28 NOTE — Telephone Encounter (Signed)
Please look into this. Thank you

## 2015-12-28 NOTE — Telephone Encounter (Signed)
Patient need a PA for prescription Bybureon pens 3mg  per week, send to [place], [place] [phone] (Phone) [phone] (Fax)

## 2015-12-28 NOTE — Telephone Encounter (Signed)
I contacted the pt. Pt was advised on [id number] trulicity was sent to replace bydureon. Pt stated the pharmacy told her to trulicity was not covered. I contacted the pharmacy and they advised me the rx from 11/23/2015 was never received. Rx resubmitted.

## 2015-12-30 ENCOUNTER — Other Ambulatory Visit: Payer: Self-pay | Admitting: Internal Medicine

## 2016-01-02 ENCOUNTER — Other Ambulatory Visit: Payer: Self-pay | Admitting: *Deleted

## 2016-01-02 MED ORDER — FLUCONAZOLE 150 MG PO TABS: ORAL_TABLET | ORAL | Status: DC

## 2016-01-03 DIAGNOSIS — Z4789 Encounter for other orthopedic aftercare: Secondary | ICD-10-CM | POA: Diagnosis not present

## 2016-01-05 ENCOUNTER — Telehealth: Payer: Self-pay | Admitting: Internal Medicine

## 2016-01-05 NOTE — Telephone Encounter (Signed)
PT called needing to know if we have received her Lantus and Humulog prescriptions.

## 2016-01-06 MED ORDER — DULAGLUTIDE 0.75 MG/0.5ML ~~LOC~~ SOAJ: 0.7500 mg | SUBCUTANEOUS | Status: DC

## 2016-01-06 NOTE — Telephone Encounter (Signed)
I contacted the pt and advised we had not received the pt assistance yet. Pt was advised we would submit another refill request to [facility] and would contact her as soon as the supply comes to our office. Pt voiced understanding.

## 2016-01-09 DIAGNOSIS — M1712 Unilateral primary osteoarthritis, left knee: Secondary | ICD-10-CM | POA: Diagnosis not present

## 2016-01-18 DIAGNOSIS — M1711 Unilateral primary osteoarthritis, right knee: Secondary | ICD-10-CM | POA: Diagnosis not present

## 2016-01-19 DIAGNOSIS — M533 Sacrococcygeal disorders, not elsewhere classified: Secondary | ICD-10-CM | POA: Diagnosis not present

## 2016-01-20 ENCOUNTER — Other Ambulatory Visit: Payer: Self-pay

## 2016-01-20 ENCOUNTER — Telehealth: Payer: Self-pay | Admitting: Internal Medicine

## 2016-01-20 ENCOUNTER — Telehealth: Payer: Self-pay

## 2016-01-20 MED ORDER — LANTUS SOLOSTAR 100 UNIT/ML ~~LOC~~ SOPN: 40.0000 [IU] | PEN_INJECTOR | Freq: Every day | SUBCUTANEOUS | Status: DC

## 2016-01-20 NOTE — Telephone Encounter (Signed)
Patient is asking for a change in medication. She says that she is taking 40units of her insulin, and not 30, that the prescription needs to be changed? The only medication I see on her MAR is Lantus, and that says she takes 36 units. Can we get some clarification so we can change medication? Please advise. Thank you!

## 2016-01-20 NOTE — Telephone Encounter (Signed)
Got clarification of medication. Ordered patient insulin again using 40 units into the skin at bedtime, per MD.

## 2016-01-20 NOTE — Telephone Encounter (Signed)
Yes, we can send Lantus 40 units a day

## 2016-01-20 NOTE — Telephone Encounter (Signed)
PT stated that she is taking 40 units of insulin instead of 30 units and that needs to be changed on the prescription.

## 2016-01-26 DIAGNOSIS — M1711 Unilateral primary osteoarthritis, right knee: Secondary | ICD-10-CM | POA: Diagnosis not present

## 2016-02-08 ENCOUNTER — Ambulatory Visit: Payer: Self-pay | Admitting: Internal Medicine

## 2016-02-14 ENCOUNTER — Telehealth: Payer: Self-pay | Admitting: Internal Medicine

## 2016-02-14 NOTE — Telephone Encounter (Signed)
Pt is asking if the PA for her trulicity was truly done because the insurance company is informing the pharmacy that it still needs the PA.

## 2016-02-22 DIAGNOSIS — M461 Sacroiliitis, not elsewhere classified: Secondary | ICD-10-CM | POA: Diagnosis not present

## 2016-02-22 DIAGNOSIS — M533 Sacrococcygeal disorders, not elsewhere classified: Secondary | ICD-10-CM | POA: Diagnosis not present

## 2016-02-22 DIAGNOSIS — M792 Neuralgia and neuritis, unspecified: Secondary | ICD-10-CM | POA: Diagnosis not present

## 2016-02-23 NOTE — Telephone Encounter (Signed)
PA for trulicity; awaiting signature. Will send in when completed.

## 2016-02-24 ENCOUNTER — Other Ambulatory Visit: Payer: Self-pay | Admitting: Obstetrics & Gynecology

## 2016-02-27 ENCOUNTER — Encounter: Payer: Self-pay | Admitting: Internal Medicine

## 2016-02-27 ENCOUNTER — Ambulatory Visit (INDEPENDENT_AMBULATORY_CARE_PROVIDER_SITE_OTHER): Payer: BLUE CROSS/BLUE SHIELD | Admitting: Internal Medicine

## 2016-02-27 VITALS — BP 152/90 | HR 65 | Ht 66.0 in | Wt 188.0 lb

## 2016-02-27 DIAGNOSIS — E119 Type 2 diabetes mellitus without complications: Secondary | ICD-10-CM

## 2016-02-27 DIAGNOSIS — Z794 Long term (current) use of insulin: Secondary | ICD-10-CM | POA: Diagnosis not present

## 2016-02-27 LAB — POCT GLYCOSYLATED HEMOGLOBIN (HGB A1C): HEMOGLOBIN A1C: 9.3

## 2016-02-27 MED ORDER — DULAGLUTIDE 0.75 MG/0.5ML ~~LOC~~ SOAJ: 0.7500 mg | SUBCUTANEOUS | 2 refills | Status: DC

## 2016-02-27 NOTE — Addendum Note (Signed)
Addended by: Caprice Beaver T on: 02/27/2016 10:43 AM   Modules accepted: Orders

## 2016-02-27 NOTE — Patient Instructions (Addendum)
Please continue: - Lantus 40 units at bedtime  Please increase: - Novolog 12 units before a smaller meal  - Novolog 14 units before a larger meal   Add Novolog Sliding scale: - 150-175: + 1 unit  - 176-200: + 2 units  - 201-225: + 3 units  - 226-250: + 4 units  - >250: + 5 units  Please start: - Trulicity [id number] mg once a week.  Please return in 1.5 months with your sugar log.

## 2016-02-27 NOTE — Progress Notes (Signed)
Patient ID: [name], female   DOB: 20-Feb-1961, 55 y.o.   MRN: [id number]  HPI: [name] is a 55 y.o.-year-old female, returning for f/u for DM2, dx in 2001, previously GDM in 1996, insulin-dependent, uncontrolled, with complications (2x DKA episodes, last in 11/2013 - Glu 696, CO2 12). Last visit 3 mo ago.  Last hemoglobin A1c was: Lab Results  Component Value Date   HGBA1C 10.3 (H) 11/16/2015   HGBA1C 9.8 (H) 08/17/2015   HGBA1C 8.50 02/23/2015  She gets steroid inj in kneeq3-4 mo.   She is on: - Lantus 40 units daily at bedtime. - Metformin 500 4x a day. - Novolog pen 12 units - 150-175: + 1 unit  - 176-200: + 2 units  - 201-225: + 3 units  - 226-250: + 4 units  - >250: + 5 units We tried to use Bydureon 2 mg once a week >> not covered. Trulicity >> not covered. We had to stop Kombiglyze. Used to be on Novolog 70/30 b/c lost insurance, now back on Lantus.  Pt checks her sugars 4x: - am: 90-140 >> 57-181  >> 40 x1, 42 x1,78x1, 90-120 >> 60, 120-175 >> 140s >> 80s, 114-170s, 191, 200s  - 2h after b'fast: n/c >> 46 (activity), 53, 111-228 >> n/c - before lunch: 200-260 >> 116-210 >> 113, 140-160, 180 >> 135 >> 90s, 200 >> n/c - 2h after lunch: n/c >> 40 x1, 104-176, 222 >> 70x1 >> n/c - before dinner: low 100s >> 41-160, 190 >> 49x1, 160-180 >> 150-160 >> n/c >> 200s - 2h after dinner: n/c >> 103-172 >> n/c - bedtime: low 200s >> 51, 74, 179-254 >> 110-289 >> 130-180 >> 160s >> 100-200s - nighttime: n/c >> 156, 172 >> n/c She has lows mostly at night or in am. Lowest sugar was 55 (in am) >> 40 x1 >> 60s >> 90 >> 80s; she has hypoglycemia awareness at 100.  Highest sugar was 320s >> 300s >> 400 x1 (steroids).    Glucometer: ReliOn   Saw nutrition 09/09/2014.  She changed her diet since last visit >> smaller portions, fruit and veggies.   - no CKD, last BUN/creatinine:  Lab Results  Component Value Date   BUN 9 08/17/2015   CREATININE 0.60  08/17/2015  On Losartan 100. - last set of lipids: Lab Results  Component Value Date   CHOL 219 (H) 08/17/2015   HDL 79.90 08/17/2015   LDLCALC 127 (H) 08/17/2015   TRIG 61.0 08/17/2015   CHOLHDL 3 08/17/2015  On Lipitor 40. - last eye exam was in 08/2015. No DR.  - no numbness and tingling in her feet.  She had 2 back surgeries: 04/2013 and 07/14/2014.   No h/o pancreatitis or Medullary ThyCa.  ROS: Constitutional: no weight loss or gain, no fatigue, + hot flushes Eyes: no blurry vision, no xerophthalmia ENT: no sore throat, no nodules palpated in throat, no dysphagia/odynophagia, no hoarseness Cardiovascular: no CP/SOB/palpitations/leg swelling Respiratory: no cough/no SOB Gastrointestinal: no N/V/D/C Musculoskeletal: + muscle aches/+ joint aches Skin: no rashes Neurological: no tremors/numbness/tingling/dizziness  I reviewed pt's medications, allergies, PMH, social hx, family hx, and changes were documented in the history of present illness. Otherwise, unchanged from my initial visit note: Past Medical History:  Diagnosis Date  . Anemia   . Anxiety   . Arthritis   . BV (bacterial vaginosis) 04/20/2015  . Constipation   . Depression   . Diabetes mellitus    h/o dka  .  Fibromyalgia   . GERD (gastroesophageal reflux disease)    tums occ  . Heart murmur    child  . Hypertension   . Pelvic pain in female 04/20/2015  . Superficial fungus infection of skin 04/20/2015  . Vaginal discharge 04/20/2015   Past Surgical History:  Procedure Laterality Date  . COLONOSCOPY  02/26/2012   SLF: Internal hemorrhoids/TCS IN 10 YEARS WITH PROPOFOL  . CYSTECTOMY  96   stomach  . ENDOMETRIAL ABLATION  08  . KNEE SURGERY Right 99   screws  . LUMBAR LAMINECTOMY/DECOMPRESSION MICRODISCECTOMY N/A 05/14/2013   Procedure: RIGHT L4-L5 DECOMPRESSION AND CYST REMOVAL;  Surgeon: Melina Schools, MD;  Location: [facility];  Service: Orthopedics;  Laterality: N/A;  . LUMBAR PERCUTANEOUS PEDICLE  SCREW 1 LEVEL N/A 07/14/2014   Procedure: REMOVAL OF L5-S1 PEDICLE SCREWS,FUSION L5-S1, REINSERTION OF PEDICLE SCREWS ILIAC CREST BONE GRAFT;  Surgeon: Melina Schools, MD;  Location: [facility];  Service: Orthopedics;  Laterality: N/A;  . SHOULDER SURGERY  10   right rotator cuff   History   Social History  . Marital Status: Divorced    Spouse Name: N/A    Number of Children: 4   Occupational History  . [employer]   Social History Main Topics  . Smoking status: Former Smoker -- 0.50 packs/day for 15 years    Types: Cigarettes    Quit date: 04/29/1991  . Smokeless tobacco: Never Used  . Alcohol Use: 0.6 oz/week    1 Glasses of wine per week     Comment: occ wine; not now  . Drug Use: No     Comment: crack/cocaine 20 yrs ago   Current Outpatient Prescriptions on File Prior to Visit  Medication Sig Dispense Refill  . amLODipine (NORVASC) 2.5 MG tablet Take 1 tablet (2.5 mg total) by mouth daily. 90 tablet 3  . aspirin EC 81 MG tablet Take 81 mg by mouth daily.     Marland Kitchen atorvastatin (LIPITOR) 40 MG tablet Take 1 tablet (40 mg total) by mouth at bedtime. 90 tablet 1  . etodolac (LODINE) 400 MG tablet Take 400 mg by mouth 2 (two) times daily.   0  . fexofenadine (ALLEGRA) 180 MG tablet Take 180 mg by mouth daily.     . fluconazole (DIFLUCAN) 150 MG tablet Take 1 today and repeat in 3 days 2 tablet 1  . fluconazole (DIFLUCAN) 150 MG tablet TAKE 1 TABLET BY MOUTH ONCE AND TAKE A SECOND TABLET 3 DAYS LATER 2 tablet 1  . fluticasone (FLONASE) 50 MCG/ACT nasal spray Place 1 spray into both nostrils daily. 16 g 3  . gabapentin (NEURONTIN) 300 MG capsule Take 1 capsule (300 mg total) by mouth 3 (three) times daily. 90 capsule 0  . glucose blood (TRUETEST TEST) test strip 1 each by Other route 4 (four) times daily. 100 each 12  . ibuprofen (ADVIL,MOTRIN) 800 MG tablet   0  . insulin aspart (NOVOLOG FLEXPEN) 100 UNIT/ML FlexPen Inject 12-15 Units into the skin 3 (three) times daily with meals. 45 mL 3   . Insulin Pen Needle (BD PEN NEEDLE NANO U/F) 32G X 4 MM MISC Use 4x a day 200 each 11  . LANTUS SOLOSTAR 100 UNIT/ML Solostar Pen Inject 40 Units into the skin at bedtime. 30 mL 3  . losartan-hydrochlorothiazide (HYZAAR) 100-12.5 MG tablet Take 1 tablet by mouth daily. 15 tablet 0  . meloxicam (MOBIC) 15 MG tablet Take 1 tablet (15 mg total) by mouth daily. 15 tablet 0  .  metFORMIN (GLUCOPHAGE-XR) 500 MG 24 hr tablet take 2 tablet by mouth twice a day with meals 120 tablet 2  . metroNIDAZOLE (FLAGYL) 500 MG tablet Take 1 tablet (500 mg total) by mouth 2 (two) times daily. 14 tablet 0  . nystatin-triamcinolone (MYCOLOG II) cream Apply 1 application topically 2 (two) times daily. 30 g 0  . traZODone (DESYREL) 100 MG tablet Take 100 mg by mouth at bedtime as needed for sleep.     . Dulaglutide (TRULICITY) [id number] [id number] SOPN Inject 0.75 mg into the skin once a week. (Patient not taking: Reported on 02/27/2016) 4 pen 2   No current facility-administered medications on file prior to visit.    No Known Allergies Family History  Problem Relation Age of Onset  . Hypertension Mother   . Cancer Mother   . Asthma Sister   . Asthma Brother   . Stomach cancer      distant relative on dad's side of family  . Colon cancer Neg Hx   . Diabetes Paternal Grandmother    PE: BP (!) 152/90 (BP Location: Left Arm, Patient Position: Sitting)   Pulse 65   Ht 5\' 6"  (1.676 m)   Wt 188 lb (85.3 kg)   SpO2 97%   BMI 30.34 kg/m  Body mass index is 30.34 kg/m. Wt Readings from Last 3 Encounters:  02/27/16 188 lb (85.3 kg)  11/25/15 190 lb (86.2 kg)  08/17/15 192 lb (87.1 kg)   Constitutional: overweight, in NAD Eyes: PERRLA, EOMI, no exophthalmos ENT: moist mucous membranes, no thyromegaly, no cervical lymphadenopathy Cardiovascular: RRR, No MRG Respiratory: CTA B Gastrointestinal: abdomen soft, NT, ND, BS+ Musculoskeletal: no deformities, strength intact in all 4 Skin: moist, warm, no  rashes Neurological: no tremor with outstretched hands, DTR normal in all 4  ASSESSMENT: 1. 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 Patient with long-standing, uncontrolled diabetes, with slightly better sugars since last visit. She did not get the GLP1 R agonist >> will try a PA and if not approved, then [employer] pt assistance pgm. Until then, will increase slightly the mealtime insulin: Patient Instructions  Please continue: - Lantus 40 units at bedtime  Please increase: - Novolog 12 units before a smaller meal  - Novolog 14 units before a larger meal   Add Novolog Sliding scale: - 150-175: + 1 unit  - 176-200: + 2 units  - 201-225: + 3 units  - 226-250: + 4 units  - >250: + 5 units  Please start: - Trulicity [id number] mg once a week.  Please return in 1.5 months with your sugar log.   - continue checking sugars at different times of the day - check 3 times a day, rotating checks - advised for yearly eye exams >> she is UTD - Will check a hemoglobin A1c today >> 9.3% (better) - Return to clinic in 1.5 mo with sugar log

## 2016-02-28 ENCOUNTER — Emergency Department (HOSPITAL_COMMUNITY)
Admission: EM | Admit: 2016-02-28 | Discharge: 2016-02-28 | Disposition: A | Discharge status: Home / Self Care | Payer: BLUE CROSS/BLUE SHIELD | Attending: Emergency Medicine | Admitting: Emergency Medicine

## 2016-02-28 ENCOUNTER — Encounter (HOSPITAL_COMMUNITY): Payer: Self-pay | Admitting: Emergency Medicine

## 2016-02-28 DIAGNOSIS — Z794 Long term (current) use of insulin: Secondary | ICD-10-CM | POA: Diagnosis not present

## 2016-02-28 DIAGNOSIS — Z79899 Other long term (current) drug therapy: Secondary | ICD-10-CM | POA: Insufficient documentation

## 2016-02-28 DIAGNOSIS — J029 Acute pharyngitis, unspecified: Secondary | ICD-10-CM

## 2016-02-28 DIAGNOSIS — F329 Major depressive disorder, single episode, unspecified: Secondary | ICD-10-CM | POA: Diagnosis not present

## 2016-02-28 DIAGNOSIS — I10 Essential (primary) hypertension: Secondary | ICD-10-CM | POA: Diagnosis not present

## 2016-02-28 DIAGNOSIS — Z7984 Long term (current) use of oral hypoglycemic drugs: Secondary | ICD-10-CM | POA: Diagnosis not present

## 2016-02-28 DIAGNOSIS — Z87891 Personal history of nicotine dependence: Secondary | ICD-10-CM | POA: Insufficient documentation

## 2016-02-28 DIAGNOSIS — Z7982 Long term (current) use of aspirin: Secondary | ICD-10-CM | POA: Diagnosis not present

## 2016-02-28 DIAGNOSIS — E119 Type 2 diabetes mellitus without complications: Secondary | ICD-10-CM | POA: Insufficient documentation

## 2016-02-28 DIAGNOSIS — J069 Acute upper respiratory infection, unspecified: Secondary | ICD-10-CM | POA: Diagnosis not present

## 2016-02-28 LAB — RAPID STREP SCREEN (MED CTR MEBANE ONLY): Streptococcus, Group A Screen (Direct): NEGATIVE

## 2016-02-28 MED ORDER — AMOXICILLIN 500 MG PO CAPS: 500.0000 mg | ORAL_CAPSULE | Freq: Three times a day (TID) | ORAL | 0 refills | Status: DC

## 2016-02-28 MED ORDER — MOMETASONE FUROATE 50 MCG/ACT NA SUSP: 2.0000 | Freq: Every day | NASAL | 12 refills | Status: AC

## 2016-02-28 NOTE — Discharge Instructions (Signed)
Nasonex as prescribed.  Amoxicillin as prescribed if not improving in the next few days.

## 2016-02-28 NOTE — ED Notes (Signed)
Pt reports generalized body aches, productive cough with thick "white" sputum, and throat pain. Denies SOB/CP.

## 2016-02-28 NOTE — ED Provider Notes (Signed)
[facility] DEPT Provider Note   CSN: [id number] Arrival date & time: 02/28/16  1110  First Provider Contact:   First MD Initiated Contact with Patient 02/28/16 1136    By signing my name below, I, Dyke Brackett, attest that this documentation has been prepared under the direction and in the presence of Veryl Speak, MD . Electronically Signed: Dyke Brackett, Scribe. 02/28/2016. 12:02 PM.   History   Chief Complaint Chief Complaint  Patient presents with  . Generalized Body Aches  . Sore Throat    HPI [name] is a 55 y.o. female with PMHx of DM who presents to the Emergency Department complaining of gradually worsening sore throat onset yesterday. She complains of associated sneezing, productive cough with white sputum, and generalized body aches.She denies sick contact. 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 Pt states she had similar symptoms 02/17 and was treated for a sinus infection. Pt denies any chest pain or SOB.   The history is provided by the patient. No language interpreter was used.  Sore Throat  The current episode started yesterday. The problem occurs constantly. The problem has been gradually worsening. Pertinent negatives include no chest pain and no shortness of breath.    Past Medical History:  Diagnosis Date  . Anemia   . Anxiety   . Arthritis   . BV (bacterial vaginosis) 04/20/2015  . Constipation   . Depression   . Diabetes mellitus    h/o dka  . Fibromyalgia   . GERD (gastroesophageal reflux disease)    tums occ  . Heart murmur    child  . Hypertension   . Pelvic pain in female 04/20/2015  . Superficial fungus infection of skin 04/20/2015  . Vaginal discharge 04/20/2015    Patient Active Problem List   Diagnosis Date Noted  . Type 2 diabetes mellitus without complication, with long-term current use of insulin ([place]) 08/17/2015  . Vaginal discharge 04/20/2015  . BV (bacterial vaginosis) 04/20/2015  .  Superficial fungus infection of skin 04/20/2015  . Pelvic pain in female 04/20/2015  . Lumbar pseudoarthrosis 07/14/2014  . Chronic low back pain 12/08/2013  . Nasal congestion 12/08/2013  . DM (diabetes mellitus) type 2, uncontrolled, with ketoacidosis ([place]) 11/25/2013  . HTN (hypertension) 11/25/2013  . Headache(784.0) 11/25/2013  . Hyperkalemia 04/13/2012  . Anemia 04/13/2012  . Depression 04/13/2012  . Diabetic neuropathy ([place]) 04/13/2012  . Constipation 02/16/2012  . Pharyngitis 05/02/2011  . Uterine bleeding 05/02/2011  . Diabetes mellitus high risk 05/02/2011  . Hypokalemia 05/02/2011    Past Surgical History:  Procedure Laterality Date  . COLONOSCOPY  02/26/2012   SLF: Internal hemorrhoids/TCS IN 10 YEARS WITH PROPOFOL  . CYSTECTOMY  96   stomach  . ENDOMETRIAL ABLATION  08  . KNEE SURGERY Right 99   screws  . LUMBAR LAMINECTOMY/DECOMPRESSION MICRODISCECTOMY N/A 05/14/2013   Procedure: RIGHT L4-L5 DECOMPRESSION AND CYST REMOVAL;  Surgeon: Melina Schools, MD;  Location: [facility];  Service: Orthopedics;  Laterality: N/A;  . LUMBAR PERCUTANEOUS PEDICLE SCREW 1 LEVEL N/A 07/14/2014   Procedure: REMOVAL OF L5-S1 PEDICLE SCREWS,FUSION L5-S1, REINSERTION OF PEDICLE SCREWS ILIAC CREST BONE GRAFT;  Surgeon: Melina Schools, MD;  Location: [facility];  Service: Orthopedics;  Laterality: N/A;  . SHOULDER SURGERY  10   right rotator cuff  . TRIGGER FINGER RELEASE      OB History    Gravida Para Term Preterm AB Living   5 4 4  1 4   SAB TAB Ectopic Multiple Live Births   1               Home Medications    Prior to Admission medications   Medication Sig Start Date End Date Taking? Authorizing Provider  amLODipine (NORVASC) 2.5 MG tablet Take 1 tablet (2.5 mg total) by mouth daily. 02/23/15   Lorayne Marek, MD  aspirin EC 81 MG tablet Take 81 mg by mouth daily.     Historical Provider, MD  atorvastatin (LIPITOR) 40 MG tablet Take 1 tablet (40 mg total) by mouth at bedtime. 02/23/15    Lorayne Marek, MD  Dulaglutide (TRULICITY) A999333 0000000 SOPN Inject 0.75 mg into the skin once a week. 02/27/16   Philemon Kingdom, MD  etodolac (LODINE) 400 MG tablet Take 400 mg by mouth 2 (two) times daily.  09/15/14   Historical Provider, MD  fexofenadine (ALLEGRA) 180 MG tablet Take 180 mg by mouth daily.     Historical Provider, MD  fluconazole (DIFLUCAN) 150 MG tablet Take 1 today and repeat in 3 days 10/04/15   Florian Buff, MD  fluconazole (DIFLUCAN) 150 MG tablet TAKE 1 TABLET BY MOUTH ONCE AND TAKE A SECOND TABLET 3 DAYS LATER 02/24/16   Florian Buff, MD  fluticasone (FLONASE) 50 MCG/ACT nasal spray Place 1 spray into both nostrils daily. 02/23/15   Lorayne Marek, MD  gabapentin (NEURONTIN) 300 MG capsule Take 1 capsule (300 mg total) by mouth 3 (three) times daily. 09/03/14   Lorayne Marek, MD  glucose blood (TRUETEST TEST) test strip 1 each by Other route 4 (four) times daily. 12/24/14   Lorayne Marek, MD  ibuprofen (ADVIL,MOTRIN) 800 MG tablet  10/06/14   Historical Provider, MD  insulin aspart (NOVOLOG FLEXPEN) 100 UNIT/ML FlexPen Inject 12-15 Units into the skin 3 (three) times daily with meals. 08/15/15   Philemon Kingdom, MD  Insulin Pen Needle (BD PEN NEEDLE NANO U/F) 32G X 4 MM MISC Use 4x a day 10/07/14   Philemon Kingdom, MD  LANTUS SOLOSTAR 100 UNIT/ML Solostar Pen Inject 40 Units into the skin at bedtime. 01/20/16   Philemon Kingdom, MD  losartan-hydrochlorothiazide (HYZAAR) 100-12.5 MG tablet Take 1 tablet by mouth daily. 09/09/15   Tresa Garter, MD  meloxicam (MOBIC) 15 MG tablet Take 1 tablet (15 mg total) by mouth daily. 09/09/15   Tresa Garter, MD  metFORMIN (GLUCOPHAGE-XR) 500 MG 24 hr tablet take 2 tablet by mouth twice a day with meals 01/02/16   Philemon Kingdom, MD  metroNIDAZOLE (FLAGYL) 500 MG tablet Take 1 tablet (500 mg total) by mouth 2 (two) times daily. 11/07/15   Florian Buff, MD  nystatin-triamcinolone (MYCOLOG II) cream Apply 1 application topically 2 (two)  times daily. 04/20/15   Estill Dooms, NP  traZODone (DESYREL) 100 MG tablet Take 100 mg by mouth at bedtime as needed for sleep.     Historical Provider, MD    Family History Family History  Problem Relation Age of Onset  . Hypertension Mother   . Cancer Mother   . Asthma Sister   . Asthma Brother   . Stomach cancer      distant relative on dad's side of family  . Diabetes Paternal Grandmother   . Colon cancer Neg Hx     Social History Social History  Substance Use Topics  . Smoking status: Former Smoker    Packs/day: 0.50    Years: 15.00    Types: Cigarettes  Quit date: 04/29/1991  . Smokeless tobacco: Never Used  . Alcohol use 0.6 oz/week    1 Glasses of wine per week     Comment: occ wine; not now     Allergies   Review of patient's allergies indicates no known allergies.   Review of Systems Review of Systems  Respiratory: Negative for shortness of breath.   Cardiovascular: Negative for chest pain.  Musculoskeletal: Positive for myalgias (generalized).  All other systems reviewed and are negative.   Physical Exam Updated Vital Signs BP 130/94   Pulse 71   Temp 99.1 F (37.3 C) (Oral)   Resp 18   Ht 5\' 5"  (1.651 m)   Wt 188 lb (85.3 kg)   SpO2 98%   BMI 31.28 kg/m   Physical Exam  Constitutional: She is oriented to person, place, and time. She appears well-developed and well-nourished. No distress.  HENT:  Head: Normocephalic and atraumatic.  PO is mildly erythematous without exudate.   Eyes: EOM are normal.  Neck: Normal range of motion.  Cardiovascular: Normal rate, regular rhythm and normal heart sounds.   Pulmonary/Chest: Effort normal and breath sounds normal.  Abdominal: Soft. She exhibits no distension. There is no tenderness.  Musculoskeletal: Normal range of motion.  Lymphadenopathy:    She has no cervical adenopathy.  Neurological: She is alert and oriented to person, place, and time.  Skin: Skin is warm and dry.  Psychiatric:  She has a normal mood and affect. Judgment normal.  Nursing note and vitals reviewed.   ED Treatments / Results  DIAGNOSTIC STUDIES:  Oxygen Saturation is 98% on RA, normal by my interpretation.    COORDINATION OF CARE:  11:38 AM Will order rapid strep. Discussed treatment plan with pt at bedside and pt agreed to plan.  Labs (all labs ordered are listed, but only abnormal results are displayed) Labs Reviewed - No data to display  EKG  EKG Interpretation None       Radiology No results found.  Procedures Procedures (including critical care time)  Medications Ordered in ED Medications - No data to display   Initial Impression / Assessment and Plan / ED Course  I have reviewed the triage vital signs and the nursing notes.  Pertinent labs & imaging results that were available during my care of the patient were reviewed by me and considered in my medical decision making (see chart for details).  Clinical Course      Final Clinical Impressions(s) / ED Diagnoses   Final diagnoses:  None   Strep test is negative. Symptoms are most likely viral in nature, however the patient insists that she is developing a sinus infection. She will be discharged with steroid nasal spray. She will also be given a prescription for amoxicillin she can fill if she is not feeling better in the next few days.   I personally performed the services described in this documentation, which was scribed in my presence. The recorded information has been reviewed and is accurate.    New Prescriptions New Prescriptions   No medications on file     Veryl Speak, MD 02/28/16 [phone]

## 2016-02-28 NOTE — ED Triage Notes (Signed)
Patient complaining of coughing, sneezing, sore throat, and body aches since yesterday.

## 2016-03-02 ENCOUNTER — Telehealth: Payer: Self-pay | Admitting: Endocrinology

## 2016-03-02 LAB — CULTURE, GROUP A STREP (THRC)

## 2016-03-02 NOTE — Telephone Encounter (Signed)
[employer] received the PA for the trulicity  We submitted to them the exception to the fomulary form not the PA form which one is it that we were trying to do  [phone] Justina Ref # [facility]

## 2016-03-07 ENCOUNTER — Telehealth: Payer: Self-pay

## 2016-03-07 NOTE — Telephone Encounter (Signed)
Called and left message advising patient to call back regarding [employer] answer on the Trulicity medication.

## 2016-03-13 ENCOUNTER — Other Ambulatory Visit: Payer: Self-pay

## 2016-03-13 MED ORDER — LIRAGLUTIDE 18 MG/3ML ~~LOC~~ SOPN: PEN_INJECTOR | SUBCUTANEOUS | 0 refills | Status: DC

## 2016-03-13 NOTE — Telephone Encounter (Signed)
Called and left message advising of the instructions for the victoza, also advised patient we had sent it into pharmacy. Gave call back number if patient had any issues or questions.

## 2016-03-28 ENCOUNTER — Other Ambulatory Visit: Payer: Self-pay | Admitting: Internal Medicine

## 2016-03-28 DIAGNOSIS — I10 Essential (primary) hypertension: Secondary | ICD-10-CM

## 2016-04-04 ENCOUNTER — Telehealth: Payer: Self-pay | Admitting: Internal Medicine

## 2016-04-04 ENCOUNTER — Telehealth: Payer: Self-pay

## 2016-04-04 NOTE — Telephone Encounter (Signed)
Notified patient that her Lantus was at the office available for her to pick up.

## 2016-04-04 NOTE — Telephone Encounter (Signed)
Called patient regarding Amlodipine, patient states she knows you do not fill this medication but she no longer sees the PCP we have on file for her, because he is no longer with the practice and she currently does not have a PCP, she is in the process of finding one, but she just got insurance and is in the process of getting a primary care. She asked if we could fill this for her just this once so she has it.

## 2016-04-04 NOTE — Telephone Encounter (Signed)
Yes, no pb! We can refill 90 tabs with 1 refill.

## 2016-04-04 NOTE — Telephone Encounter (Signed)
PT needs Amlodipine sent into the [facility] in [place]

## 2016-04-05 ENCOUNTER — Other Ambulatory Visit: Payer: Self-pay

## 2016-04-05 DIAGNOSIS — I10 Essential (primary) hypertension: Secondary | ICD-10-CM

## 2016-04-05 MED ORDER — AMLODIPINE BESYLATE 2.5 MG PO TABS: 2.5000 mg | ORAL_TABLET | Freq: Every day | ORAL | 1 refills | Status: DC

## 2016-04-05 MED ORDER — AMLODIPINE BESYLATE 2.5 MG PO TABS: 2.5000 mg | ORAL_TABLET | Freq: Every day | ORAL | 3 refills | Status: DC

## 2016-04-10 ENCOUNTER — Encounter: Payer: Self-pay | Admitting: Internal Medicine

## 2016-04-10 ENCOUNTER — Ambulatory Visit (INDEPENDENT_AMBULATORY_CARE_PROVIDER_SITE_OTHER): Payer: BLUE CROSS/BLUE SHIELD | Admitting: Internal Medicine

## 2016-04-10 VITALS — BP 128/82 | HR 76 | Ht 64.0 in | Wt 183.0 lb

## 2016-04-10 DIAGNOSIS — Z794 Long term (current) use of insulin: Secondary | ICD-10-CM | POA: Diagnosis not present

## 2016-04-10 DIAGNOSIS — E1110 Type 2 diabetes mellitus with ketoacidosis without coma: Secondary | ICD-10-CM

## 2016-04-10 DIAGNOSIS — E1310 Other specified diabetes mellitus with ketoacidosis without coma: Secondary | ICD-10-CM | POA: Diagnosis not present

## 2016-04-10 MED ORDER — LANTUS SOLOSTAR 100 UNIT/ML ~~LOC~~ SOPN: 34.0000 [IU] | PEN_INJECTOR | Freq: Every day | SUBCUTANEOUS | 3 refills | Status: DC

## 2016-04-10 MED ORDER — LIRAGLUTIDE 18 MG/3ML ~~LOC~~ SOPN: PEN_INJECTOR | SUBCUTANEOUS | 3 refills | Status: DC

## 2016-04-10 MED ORDER — GABAPENTIN 300 MG PO CAPS: 600.0000 mg | ORAL_CAPSULE | Freq: Two times a day (BID) | ORAL | 0 refills | Status: AC

## 2016-04-10 NOTE — Progress Notes (Signed)
Patient ID: [name], female   DOB: 05-Jul-1961, 55 y.o.   MRN: [id number]  HPI: [name] is a 55 y.o.-year-old female, returning for f/u for DM2, dx in 2001, previously GDM in 1996, insulin-dependent, uncontrolled, with complications (2x DKA episodes, last in 11/2013 - Glu 696, CO2 12). Last visit ~2 mo ago.  She lost 5 lbs since last visit.  She also got a dog and started to walk 2x a day.  Last hemoglobin A1c was: Lab Results  Component Value Date   HGBA1C 9.3 02/27/2016   HGBA1C 10.3 (H) 11/16/2015   HGBA1C 9.8 (H) 08/17/2015  She gets steroid inj in kneeq3-4 mo.   She is on: - Metformin ER 1000 mg 2x a day - Lantus 40 units at bedtime - Novolog: - 12 units before a smaller meal  - 14 units before a larger meal  - Novolog Sliding scale: - 150-175: + 1 unit  - 176-200: + 2 units  - 201-225: + 3 units  - 226-250: + 4 units  - >250: + 5 units - Victoza 1.8 mg once a day We tried to use Bydureon 2 mg once a week >> not covered. Trulicity >> not covered. We had to stop Kombiglyze. Used to be on Novolog 70/30 b/c lost insurance, now back on Lantus.  Pt checks her sugars 4x: - am: 40 x1, 42 x1,78x1, 90-120 >> 60, 120-175 >> 140s >> 80s, 114-170s, 191, 200s >> 64-154, 195, 244 if eating at night - 2h after b'fast: n/c >> 46 (activity), 53, 111-228 >> n/c - before lunch: 200-260 >> 116-210 >> 113, 140-160, 180 >> 135 >> 90s, 200 >> n/c >> 70-126 - 2h after lunch: n/c >> 40 x1, 104-176, 222 >> 70x1 >> n/c - before dinner: low 100s >> 41-160, 190 >> 49x1, 160-180 >> 150-160 >> n/c >> 200s >> 165 - 2h after dinner: n/c >> 103-172 >> n/c - bedtime: low 200s >> 51, 74, 179-254 >> 110-289 >> 130-180 >> 160s >> 100-200s >> 168, 195 - nighttime: n/c >> 156, 172 >> n/c She has lows mostly at night or in am. Lowest sugar was 55 (in am) >> 40 x1 >> 60s >> 90 >> 80s >> 64; she has hypoglycemia awareness at 100.  Highest sugar was 320s >> 300s >> 400 x1 (steroids) >>  244    Glucometer: ReliOn   Saw nutrition 09/09/2014.  - no CKD, last BUN/creatinine:  Lab Results  Component Value Date   BUN 9 08/17/2015   CREATININE 0.60 08/17/2015  On Losartan 100. - last set of lipids: Lab Results  Component Value Date   CHOL 219 (H) 08/17/2015   HDL 79.90 08/17/2015   LDLCALC 127 (H) 08/17/2015   TRIG 61.0 08/17/2015   CHOLHDL 3 08/17/2015  On Lipitor 40. - last eye exam was in 08/2015. No DR.  - no numbness and tingling in her feet.  She had 2 back surgeries: 04/2013 and 07/14/2014.   No h/o pancreatitis or Medullary ThyCa.  ROS: Constitutional: + weight loss, no fatigue, + hot flushes Eyes: no blurry vision, no xerophthalmia ENT: no sore throat, no nodules palpated in throat, no dysphagia/odynophagia, no hoarseness Cardiovascular: no CP/SOB/palpitations/leg swelling Respiratory: no cough/no SOB Gastrointestinal: no N/V/+ D/no C Musculoskeletal: + muscle aches/+ joint aches Skin: no rashes Neurological: no tremors/numbness/tingling/dizziness, + HA  I reviewed pt's medications, allergies, PMH, social hx, family hx, and changes were documented in the history of present illness. Otherwise, unchanged from my  initial visit note: Past Medical History:  Diagnosis Date  . Anemia   . Anxiety   . Arthritis   . BV (bacterial vaginosis) 04/20/2015  . Constipation   . Depression   . Diabetes mellitus    h/o dka  . Fibromyalgia   . GERD (gastroesophageal reflux disease)    tums occ  . Heart murmur    child  . Hypertension   . Pelvic pain in female 04/20/2015  . Superficial fungus infection of skin 04/20/2015  . Vaginal discharge 04/20/2015   Past Surgical History:  Procedure Laterality Date  . COLONOSCOPY  02/26/2012   SLF: Internal hemorrhoids/TCS IN 10 YEARS WITH PROPOFOL  . CYSTECTOMY  96   stomach  . ENDOMETRIAL ABLATION  08  . KNEE SURGERY Right 99   screws  . LUMBAR LAMINECTOMY/DECOMPRESSION MICRODISCECTOMY N/A 05/14/2013    Procedure: RIGHT L4-L5 DECOMPRESSION AND CYST REMOVAL;  Surgeon: Melina Schools, MD;  Location: [facility];  Service: Orthopedics;  Laterality: N/A;  . LUMBAR PERCUTANEOUS PEDICLE SCREW 1 LEVEL N/A 07/14/2014   Procedure: REMOVAL OF L5-S1 PEDICLE SCREWS,FUSION L5-S1, REINSERTION OF PEDICLE SCREWS ILIAC CREST BONE GRAFT;  Surgeon: Melina Schools, MD;  Location: [facility];  Service: Orthopedics;  Laterality: N/A;  . SHOULDER SURGERY  10   right rotator cuff  . TRIGGER FINGER RELEASE     History   Social History  . Marital Status: Divorced    Spouse Name: N/A    Number of Children: 4   Occupational History  . [employer]   Social History Main Topics  . Smoking status: Former Smoker -- 0.50 packs/day for 15 years    Types: Cigarettes    Quit date: 04/29/1991  . Smokeless tobacco: Never Used  . Alcohol Use: 0.6 oz/week    1 Glasses of wine per week     Comment: occ wine; not now  . Drug Use: No     Comment: crack/cocaine 20 yrs ago   Current Outpatient Prescriptions on File Prior to Visit  Medication Sig Dispense Refill  . amLODipine (NORVASC) 2.5 MG tablet Take 1 tablet (2.5 mg total) by mouth daily. 90 tablet 1  . amoxicillin (AMOXIL) 500 MG capsule Take 1 capsule (500 mg total) by mouth 3 (three) times daily. 21 capsule 0  . aspirin EC 81 MG tablet Take 81 mg by mouth daily.     Marland Kitchen azelastine (ASTELIN) 0.1 % nasal spray Place 2 sprays into both nostrils daily as needed for allergies.    . Dulaglutide (TRULICITY) A999333 0000000 SOPN Inject 0.75 mg into the skin once a week. 4 pen 2  . fexofenadine (ALLEGRA) 180 MG tablet Take 180 mg by mouth daily.     Marland Kitchen gabapentin (NEURONTIN) 300 MG capsule Take 1 capsule (300 mg total) by mouth 3 (three) times daily. 90 capsule 0  . glucose blood (TRUETEST TEST) test strip 1 each by Other route 4 (four) times daily. 100 each 12  . insulin aspart (NOVOLOG FLEXPEN) 100 UNIT/ML FlexPen Inject 12-15 Units into the skin 3 (three) times daily with meals. 45 mL 3  .  Insulin Pen Needle (BD PEN NEEDLE NANO U/F) 32G X 4 MM MISC Use 4x a day 200 each 11  . LANTUS SOLOSTAR 100 UNIT/ML Solostar Pen Inject 40 Units into the skin at bedtime. 30 mL 3  . Liraglutide (VICTOZA) 18 MG/3ML SOPN Inject 0.6 mg for 4 days; then increase to 1.2 mg for 4 days, then take 1.8 mg. 9 pen 0  .  losartan-hydrochlorothiazide (HYZAAR) 100-12.5 MG tablet Take 1 tablet by mouth daily. 15 tablet 0  . meloxicam (MOBIC) 15 MG tablet Take 1 tablet (15 mg total) by mouth daily. 15 tablet 0  . metFORMIN (GLUCOPHAGE-XR) 500 MG 24 hr tablet take 2 tablet by mouth twice a day with meals 120 tablet 2  . mometasone (NASONEX) 50 MCG/ACT nasal spray Place 2 sprays into the nose daily. 17 g 12  . nystatin-triamcinolone (MYCOLOG II) cream Apply 1 application topically 2 (two) times daily. (Patient taking differently: Apply 1 application topically daily as needed (rash). ) 30 g 0  . traZODone (DESYREL) 100 MG tablet Take 100 mg by mouth at bedtime as needed for sleep.      No current facility-administered medications on file prior to visit.    No Known Allergies Family History  Problem Relation Age of Onset  . Hypertension Mother   . Cancer Mother   . Asthma Sister   . Asthma Brother   . Stomach cancer      distant relative on dad's side of family  . Diabetes Paternal Grandmother   . Colon cancer Neg Hx    PE: BP 128/82 (BP Location: Left Arm, Patient Position: Sitting)   Pulse 76   Ht 5\' 4"  (1.626 m)   Wt 183 lb (83 kg)   SpO2 95%   BMI 31.41 kg/m  Body mass index is 31.41 kg/m. Wt Readings from Last 3 Encounters:  04/10/16 183 lb (83 kg)  02/28/16 188 lb (85.3 kg)  02/27/16 188 lb (85.3 kg)   Constitutional: overweight, in NAD Eyes: PERRLA, EOMI, no exophthalmos ENT: moist mucous membranes, no thyromegaly, no cervical lymphadenopathy Cardiovascular: RRR, No MRG Respiratory: CTA B Gastrointestinal: abdomen soft, NT, ND, BS+ Musculoskeletal: no deformities, strength intact in all  4 Skin: moist, warm, no rashes Neurological: no tremor with outstretched hands, DTR normal in all 4  ASSESSMENT: 1. 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 Patient with long-standing, uncontrolled diabetes, with better sugars since last visit. At last visit, we increased Novolog, started a Novolog SSI and added a GLP1 R agonist. Will decrease Lantus and NovoLog as she can have low CBGs >> overcorrects them >> high CBGs. - last HbA1c reviewed >> better: 9.3% - I advised her to: Patient Instructions   Patient Instructions  Please decrease Lantus to 34 units.  Please decrease Novolog as follows: - try to skip this with b'fast - use 10-14 units before lunch and dinner.  Continue Novolog Sliding scale: - 150-175: + 1 unit  - 176-200: + 2 units  - 201-225: + 3 units  - 226-250: + 4 units  - >250: + 5 units  Continue Victoza 1.8 mg daily before b'fast.  Please return in 1.5 months with your sugar log.   - continue checking sugars at different times of the day - check 3 times a day, rotating checks - advised for yearly eye exams >> she is UTD - Return to clinic in 1.5 mo with sugar log   Philemon Kingdom, MD PhD [facility] Endocrinology

## 2016-04-10 NOTE — Patient Instructions (Addendum)
Please decrease Lantus to 34 units.  Please decrease Novolog as follows: - try to skip this with b'fast - use 10-14 units before lunch and dinner.  Continue Novolog Sliding scale: - 150-175: + 1 unit  - 176-200: + 2 units  - 201-225: + 3 units  - 226-250: + 4 units  - >250: + 5 units  Continue Victoza 1.8 mg daily before b'fast.  Please return in 1.5 months with your sugar log.

## 2016-04-16 DIAGNOSIS — M25561 Pain in right knee: Secondary | ICD-10-CM | POA: Diagnosis not present

## 2016-04-27 ENCOUNTER — Other Ambulatory Visit: Payer: Self-pay

## 2016-04-27 ENCOUNTER — Telehealth: Payer: Self-pay | Admitting: Internal Medicine

## 2016-04-27 MED ORDER — LIRAGLUTIDE 18 MG/3ML ~~LOC~~ SOPN: PEN_INJECTOR | SUBCUTANEOUS | 3 refills | Status: DC

## 2016-04-27 NOTE — Telephone Encounter (Signed)
Pt called requesting a refill of the Victoza be called in.

## 2016-04-30 ENCOUNTER — Other Ambulatory Visit: Payer: Self-pay | Admitting: Internal Medicine

## 2016-05-22 ENCOUNTER — Ambulatory Visit: Payer: Self-pay | Admitting: Internal Medicine

## 2016-05-22 DIAGNOSIS — Z0289 Encounter for other administrative examinations: Secondary | ICD-10-CM

## 2016-06-04 ENCOUNTER — Telehealth: Payer: Self-pay | Admitting: *Deleted

## 2016-06-04 ENCOUNTER — Other Ambulatory Visit: Payer: Self-pay | Admitting: Obstetrics & Gynecology

## 2016-06-04 NOTE — Telephone Encounter (Signed)
done

## 2016-06-04 NOTE — Telephone Encounter (Signed)
Pt requesting refill on Diflucan.

## 2016-06-21 ENCOUNTER — Telehealth: Payer: Self-pay | Admitting: Internal Medicine

## 2016-06-21 NOTE — Telephone Encounter (Signed)
[facility] called regarding some paperwork that they received from this office on this Pt.  They request call back to discuss some questions. [phone] Option 2

## 2016-06-21 NOTE — Telephone Encounter (Signed)
Let's call them to see what they need. Thank you.

## 2016-06-22 ENCOUNTER — Other Ambulatory Visit: Payer: Self-pay | Admitting: Internal Medicine

## 2016-06-22 DIAGNOSIS — Z794 Long term (current) use of insulin: Principal | ICD-10-CM

## 2016-06-22 DIAGNOSIS — E119 Type 2 diabetes mellitus without complications: Secondary | ICD-10-CM

## 2016-06-22 MED ORDER — LANTUS SOLOSTAR 100 UNIT/ML ~~LOC~~ SOPN: 34.0000 [IU] | PEN_INJECTOR | Freq: Every day | SUBCUTANEOUS | 3 refills | Status: DC

## 2016-07-19 ENCOUNTER — Telehealth: Payer: Self-pay | Admitting: Internal Medicine

## 2016-07-19 MED ORDER — LANTUS SOLOSTAR 100 UNIT/ML ~~LOC~~ SOPN: 34.0000 [IU] | PEN_INJECTOR | Freq: Every day | SUBCUTANEOUS | 3 refills | Status: DC

## 2016-07-19 NOTE — Telephone Encounter (Signed)
Fax number is [phone] for [facility] please fax a rx for the lantus solostar with the dx code, dosing and directions to that #

## 2016-07-19 NOTE — Telephone Encounter (Signed)
Prescription submitted and and faxed to the provided number.

## 2016-07-26 ENCOUNTER — Telehealth: Payer: Self-pay

## 2016-07-26 NOTE — Telephone Encounter (Signed)
I contacted the patient and advised we have received her patient assistance for the Lantus. Patient voiced understanding,

## 2016-08-13 ENCOUNTER — Telehealth: Payer: Self-pay | Admitting: *Deleted

## 2016-08-14 ENCOUNTER — Other Ambulatory Visit: Payer: Self-pay | Admitting: Internal Medicine

## 2016-08-14 NOTE — Telephone Encounter (Signed)
[phone] opt 2  [facility] is calling to see if we have received the paperwork for the pt

## 2016-08-14 NOTE — Telephone Encounter (Signed)
Received faxes. Will have MD sign, and fax back.

## 2016-08-14 NOTE — Telephone Encounter (Signed)
Will look through faxes.

## 2016-08-15 MED ORDER — FLUCONAZOLE 150 MG PO TABS: ORAL_TABLET | ORAL | 1 refills | Status: AC

## 2016-08-15 NOTE — Telephone Encounter (Signed)
Pt states she has another yeast infection since she has recently been on antibiotics.

## 2016-08-15 NOTE — Telephone Encounter (Signed)
Informed patient diflucan prescription was sent to pharmacy.

## 2016-08-23 ENCOUNTER — Telehealth: Payer: Self-pay

## 2016-08-23 ENCOUNTER — Telehealth: Payer: Self-pay | Admitting: Internal Medicine

## 2016-08-23 NOTE — Telephone Encounter (Signed)
Called patient and notified that we did have some victoza in the office that she could use. Patient will come pick up tomorrow.

## 2016-08-23 NOTE — Telephone Encounter (Signed)
Patient want to know if have samples for the victoza.please advise

## 2016-09-12 ENCOUNTER — Other Ambulatory Visit: Payer: Self-pay | Admitting: Internal Medicine

## 2016-09-13 ENCOUNTER — Ambulatory Visit (INDEPENDENT_AMBULATORY_CARE_PROVIDER_SITE_OTHER): Payer: BLUE CROSS/BLUE SHIELD | Admitting: Internal Medicine

## 2016-09-13 ENCOUNTER — Encounter: Payer: Self-pay | Admitting: Internal Medicine

## 2016-09-13 VITALS — BP 142/82 | HR 80 | Ht 65.0 in | Wt 176.0 lb

## 2016-09-13 DIAGNOSIS — I10 Essential (primary) hypertension: Secondary | ICD-10-CM | POA: Diagnosis not present

## 2016-09-13 DIAGNOSIS — Z794 Long term (current) use of insulin: Secondary | ICD-10-CM | POA: Diagnosis not present

## 2016-09-13 DIAGNOSIS — E1310 Other specified diabetes mellitus with ketoacidosis without coma: Secondary | ICD-10-CM

## 2016-09-13 DIAGNOSIS — E785 Hyperlipidemia, unspecified: Secondary | ICD-10-CM | POA: Diagnosis not present

## 2016-09-13 DIAGNOSIS — E1110 Type 2 diabetes mellitus with ketoacidosis without coma: Secondary | ICD-10-CM

## 2016-09-13 MED ORDER — LOSARTAN POTASSIUM-HCTZ 100-12.5 MG PO TABS: 1.0000 | ORAL_TABLET | Freq: Every day | ORAL | 3 refills | Status: DC

## 2016-09-13 NOTE — Patient Instructions (Addendum)
Please continue: - Victoza 1.8 mg daily before b'fast. - Lantus 30 units at bedtime - Novolog 10 units before meals - Novolog Sliding scale: - 150-175: + 1 unit  - 176-200: + 2 units  - 201-225: + 3 units  - 226-250: + 4 units  - >250: + 5 units  Try to eliminate the snack at night.  Please stop at the lab.  Please return in 3 months with your sugar log.

## 2016-09-13 NOTE — Progress Notes (Signed)
Patient ID: [name], female   DOB: February 15, 1961, 56 y.o.   MRN: [id number]  HPI: [name] is a 56 y.o.-year-old female, returning for f/u for DM2, dx in 2001, previously GDM in 1996, insulin-dependent, uncontrolled, with complications (2x DKA episodes, last in 11/2013 - Glu 696, CO2 12). Last visit 5 mo ago.   She lost 30 lbs since starting Victoza!   Last hemoglobin A1c was: Lab Results  Component Value Date   HGBA1C 9.3 02/27/2016   HGBA1C 10.3 (H) 11/16/2015   HGBA1C 9.8 (H) 08/17/2015  She gets steroid inj in kneeq3-4 mo.   She is on: - Lantus 34 >> 30 units at bedtime.  She decreased the dose due to lows in am. No lows after the decrease. - Novolog: - 10 units before the 3 meals - Novolog Sliding scale: - 150-175: + 1 unit  - 176-200: + 2 units  - 201-225: + 3 units  - 226-250: + 4 units  - >250: + 5 units - Victoza 1.8 mg daily before b'fast. She was off Victoza for 2 weeks.  We tried to use Bydureon 2 mg once a week >> not covered. Trulicity >> not covered. We had to stop Kombiglyze. Used to be on Novolog 70/30 b/c lost insurance, now back on Lantus.  Pt checks her sugars 3x: - am:  140s >> 80s, 114-170s, 191, 200s >> 64-154, 195, 244 if eating at night >> 90-160s - 2h after b'fast: n/c >> 46 (activity), 53, 111-228 >> n/c - before lunch:  116-210 >> 113, 140-160, 180 >> 135 >> 90s, 200 >> n/c >> 70-126 >> 115-165 - 2h after lunch: n/c >> 40 x1, 104-176, 222 >> 70x1 >> n/c - before dinner:  41-160, 190 >> 49x1, 160-180 >> 150-160 >> n/c >> 200s >> 165 >> n/c - 2h after dinner: n/c >> 103-172 >> n/c >> n/c - bedtime:  110-289 >> 130-180 >> 160s >> 100-200s >> 168, 195 >> 90-140 - nighttime: n/c >> 156, 172 >> n/c She has lows mostly at night or in am. Lowest sugar was  64 >> 30s x 1 >> she decreased insulin doses after this; she has hypoglycemia awareness at 100.  Highest sugar was 244 >> 200s while off Victoza.   Glucometer: ReliOn   Saw  nutrition 09/09/2014.  - no CKD, last BUN/creatinine:  11/14/2015: 16/0.62 Lab Results  Component Value Date   BUN 9 08/17/2015   CREATININE 0.60 08/17/2015  On Losartan 100. - last set of lipids: Lab Results  Component Value Date   CHOL 219 (H) 08/17/2015   HDL 79.90 08/17/2015   LDLCALC 127 (H) 08/17/2015   TRIG 61.0 08/17/2015   CHOLHDL 3 08/17/2015  On Lipitor 40 >> now off b/c palpitations (?) - at least 1 year ago. - last eye exam was in 08/2015. No DR.  - no numbness and tingling in her feet.  She had 2 back surgeries: 04/2013 and 07/14/2014.  She has H pylori infection (Dr. Percell Mcdowell).   No h/o pancreatitis or Medullary ThyCa.  ROS: Constitutional: + weight loss, no fatigue, + hot flushes Eyes: no blurry vision, no xerophthalmia ENT: no sore throat, no nodules palpated in throat, no dysphagia/odynophagia, no hoarseness Cardiovascular: no CP/SOB/palpitations/leg swelling Respiratory: no cough/no SOB Gastrointestinal: no N/V/D/C Musculoskeletal: no muscle aches/joint aches Skin: no rashes Neurological: no tremors/numbness/tingling/dizziness  I reviewed pt's medications, allergies, PMH, social hx, family hx, and changes were documented in the history of present illness. Otherwise, unchanged  from my initial visit note: Past Medical History:  Diagnosis Date  . Anemia   . Anxiety   . Arthritis   . BV (bacterial vaginosis) 04/20/2015  . Constipation   . Depression   . Diabetes mellitus    h/o dka  . Fibromyalgia   . GERD (gastroesophageal reflux disease)    tums occ  . Heart murmur    child  . Hypertension   . Pelvic pain in female 04/20/2015  . Superficial fungus infection of skin 04/20/2015  . Vaginal discharge 04/20/2015   Past Surgical History:  Procedure Laterality Date  . COLONOSCOPY  02/26/2012   SLF: Internal hemorrhoids/TCS IN 10 YEARS WITH PROPOFOL  . CYSTECTOMY  96   stomach  . ENDOMETRIAL ABLATION  08  . KNEE SURGERY Right 99   screws  . LUMBAR  LAMINECTOMY/DECOMPRESSION MICRODISCECTOMY N/A 05/14/2013   Procedure: RIGHT L4-L5 DECOMPRESSION AND CYST REMOVAL;  Surgeon: Judy Schools, MD;  Location: [facility];  Service: Orthopedics;  Laterality: N/A;  . LUMBAR PERCUTANEOUS PEDICLE SCREW 1 LEVEL N/A 07/14/2014   Procedure: REMOVAL OF L5-S1 PEDICLE SCREWS,FUSION L5-S1, REINSERTION OF PEDICLE SCREWS ILIAC CREST BONE GRAFT;  Surgeon: Judy Schools, MD;  Location: [facility];  Service: Orthopedics;  Laterality: N/A;  . SHOULDER SURGERY  10   right rotator cuff  . TRIGGER FINGER RELEASE     History   Social History  . Marital Status: Divorced    Spouse Name: N/A    Number of Children: 4   Occupational History  . [employer]   Social History Main Topics  . Smoking status: Former Smoker -- 0.50 packs/day for 15 years    Types: Cigarettes    Quit date: 04/29/1991  . Smokeless tobacco: Never Used  . Alcohol Use: 0.6 oz/week    1 Glasses of wine per week     Comment: occ wine; not now  . Drug Use: No     Comment: crack/cocaine 20 yrs ago   Current Outpatient Prescriptions on File Prior to Visit  Medication Sig Dispense Refill  . amLODipine (NORVASC) 2.5 MG tablet Take 1 tablet (2.5 mg total) by mouth daily. 90 tablet 1  . aspirin EC 81 MG tablet Take 81 mg by mouth daily.     Marland Kitchen azelastine (ASTELIN) 0.1 % nasal spray Place 2 sprays into both nostrils daily as needed for allergies.    . fexofenadine (ALLEGRA) 180 MG tablet Take 180 mg by mouth daily.     . fluconazole (DIFLUCAN) 150 MG tablet take 1 tablet by mouth immediately and repeat in 3 days 2 tablet 1  . glucose blood (TRUETEST TEST) test strip 1 each by Other route 4 (four) times daily. 100 each 12  . insulin aspart (NOVOLOG FLEXPEN) 100 UNIT/ML FlexPen Inject 12-15 Units into the skin 3 (three) times daily with meals. (Patient taking differently: Inject 12-15 Units into the skin 3 (three) times daily with meals. ) 45 mL 3  . Insulin Pen Needle (BD PEN NEEDLE NANO U/F) 32G X 4 MM MISC Use  4x a day 200 each 11  . LANTUS SOLOSTAR 100 UNIT/ML Solostar Pen Inject 34 Units into the skin at bedtime. (Patient taking differently: Inject 34 Units into the skin at bedtime. ) 30 mL 3  . Liraglutide (VICTOZA) 18 MG/3ML SOPN Inject 1.8 mg daily under skin in am 9 pen 3  . losartan-hydrochlorothiazide (HYZAAR) 100-12.5 MG tablet Take 1 tablet by mouth daily. 15 tablet 0  . meloxicam (MOBIC) 15 MG tablet  Take 1 tablet (15 mg total) by mouth daily. 15 tablet 0  . metFORMIN (GLUCOPHAGE-XR) 500 MG 24 hr tablet take 2 tablets twice a day with food 120 tablet 2  . mometasone (NASONEX) 50 MCG/ACT nasal spray Place 2 sprays into the nose daily. 17 g 12  . nystatin-triamcinolone (MYCOLOG II) cream Apply 1 application topically 2 (two) times daily. (Patient taking differently: Apply 1 application topically daily as needed (rash). ) 30 g 0  . traZODone (DESYREL) 100 MG tablet Take 100 mg by mouth at bedtime as needed for sleep.     Marland Kitchen gabapentin (NEURONTIN) 300 MG capsule Take 2 capsules (600 mg total) by mouth 2 (two) times daily. (Patient not taking: Reported on 09/13/2016) 90 capsule 0   No current facility-administered medications on file prior to visit.    No Known Allergies Family History  Problem Relation Age of Onset  . Hypertension Mother   . Cancer Mother   . Asthma Sister   . Asthma Brother   . Stomach cancer      distant relative on dad's side of family  . Diabetes Paternal Grandmother   . Colon cancer Neg Hx    PE: BP (!) 142/82 (BP Location: Left Arm, Patient Position: Sitting)   Pulse 80   Ht 5\' 5"  (1.651 m)   Wt 176 lb (79.8 kg)   SpO2 98%   BMI 29.29 kg/m  Body mass index is 29.29 kg/m. Wt Readings from Last 3 Encounters:  09/13/16 176 lb (79.8 kg)  04/10/16 183 lb (83 kg)  02/28/16 188 lb (85.3 kg)   Constitutional: overweight, in NAD Eyes: PERRLA, EOMI, no exophthalmos ENT: moist mucous membranes, no thyromegaly, no cervical lymphadenopathy Cardiovascular: RRR, No  MRG Respiratory: CTA B Gastrointestinal: abdomen soft, NT, ND, BS+ Musculoskeletal: no deformities, strength intact in all 4 Skin: moist, warm, no rashes Neurological: no tremor with outstretched hands, DTR normal in all 4  ASSESSMENT: 1. 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 HL  PLAN:  1. Patient with long-standing, uncontrolled diabetes, with improving sugars after we increased Novolog, started a Novolog SSI and added a GLP1 R agonist. She had to decrease the Lantus and NovoLog doses as she had low CBGs (1 in the 30s!) >> No more low blood sugars afterwards. However, she is usually eating a snack at night to prevent a possible low. I advised her that she can start doing this so that we can improve the sugars in the morning. - last HbA1c reviewed >> better: 9.3%. Today, this is improved further, at 8.8%. - I advised her to: Patient Instructions  Please continue: - Victoza 1.8 mg daily before b'fast. - Lantus 30 units at bedtime - Novolog 10 units before meals - Novolog Sliding scale: - 150-175: + 1 unit  - 176-200: + 2 units  - 201-225: + 3 units  - 226-250: + 4 units  - >250: + 5 units  Try to eliminate the snack at night.  Please stop at the lab.  Please return in 3 months with your sugar log.   - continue checking sugars at different times of the day - check 3 times a day, rotating checks - advised for yearly eye exams >> she is UTD - Return to clinic in 3 mo with sugar log  2. HL - Reviewed previous lipid levels   - She was on Lipitor, but she stopped because she thought this is giving her palpitations;  she has been off for about a year.  - We'll recheck lipids today. She is not fasting.   Component     Latest Ref Rng & Units 08/17/2015 09/13/2016  Cholesterol     0 - 200 mg/dL 219 (H) 274 (H)  Triglycerides     0.0 - 149.0 mg/dL 61.0 143.0  HDL Cholesterol     >39.00 mg/dL 79.90 97.20   VLDL     0.0 - 40.0 mg/dL 12.2 28.6  LDL (calc)     0 - 99 mg/dL 127 (H) 149 (H)  Total CHOL/HDL Ratio      3 3  NonHDL      139.55 177.23  LDL higher >> will advise her to restart a statin >> will try Livalo.  Philemon Kingdom, MD PhD [facility]

## 2016-09-14 ENCOUNTER — Telehealth: Payer: Self-pay

## 2016-09-14 LAB — LIPID PANEL
CHOL/HDL RATIO: 3
CHOLESTEROL: 274 mg/dL — AB (ref 0–200)
HDL: 97.2 mg/dL (ref >39.00)
LDL CALC: 149 mg/dL — AB (ref 0–99)
NonHDL: 177.23
Triglycerides: 143 mg/dL (ref 0.0–149.0)
VLDL: 28.6 mg/dL (ref 0.0–40.0)

## 2016-09-14 LAB — POCT GLYCOSYLATED HEMOGLOBIN (HGB A1C): Hemoglobin A1C: 8.8

## 2016-09-14 MED ORDER — PITAVASTATIN CALCIUM 4 MG PO TABS: ORAL_TABLET | ORAL | 3 refills | Status: DC

## 2016-09-14 NOTE — Telephone Encounter (Signed)
Called patient and gave lab results. Patient had no questions or concerns. Will submit RX.

## 2016-09-14 NOTE — Telephone Encounter (Signed)
-----   Message from Philemon Kingdom, MD sent at 09/14/2016 12:14 PM EST ----- Almyra Free, can you please call pt: LDL higher >> I would advise her to restart a statin >> does she want to try Livalo? This is the most diabetes-friendly of them. If so, I added it to her med list >> please send it to her pharmacy.

## 2016-09-14 NOTE — Addendum Note (Signed)
Addended by: Ena Dawley on: 09/14/2016 04:39 PM   Modules accepted: Orders

## 2016-09-19 ENCOUNTER — Other Ambulatory Visit: Payer: Self-pay

## 2016-09-19 MED ORDER — PITAVASTATIN CALCIUM 4 MG PO TABS: ORAL_TABLET | ORAL | 3 refills | Status: DC

## 2016-09-19 MED ORDER — METFORMIN HCL ER 500 MG PO TB24: ORAL_TABLET | ORAL | 2 refills | Status: DC

## 2016-09-19 NOTE — Telephone Encounter (Signed)
Patient called in and stated she needed refills on her medications. Which I submitted for her. No other questions at this time.

## 2016-10-11 ENCOUNTER — Telehealth: Payer: Self-pay

## 2016-10-11 NOTE — Telephone Encounter (Signed)
Called and notified patient we had received her Lantus pens, to come and pick them up. They are in the back fridge. Patient had understanding, and no questions.

## 2016-11-20 ENCOUNTER — Other Ambulatory Visit: Payer: Self-pay

## 2016-11-20 MED ORDER — LANTUS SOLOSTAR 100 UNIT/ML ~~LOC~~ SOPN: 34.0000 [IU] | PEN_INJECTOR | Freq: Every day | SUBCUTANEOUS | 3 refills | Status: DC

## 2016-11-20 MED ORDER — INSULIN ASPART 100 UNIT/ML FLEXPEN: 12.0000 [IU] | PEN_INJECTOR | Freq: Three times a day (TID) | SUBCUTANEOUS | 3 refills | Status: DC

## 2016-11-20 MED ORDER — PITAVASTATIN CALCIUM 4 MG PO TABS: ORAL_TABLET | ORAL | 3 refills | Status: AC

## 2016-11-24 ENCOUNTER — Other Ambulatory Visit: Payer: Self-pay | Admitting: Internal Medicine

## 2016-11-28 ENCOUNTER — Other Ambulatory Visit: Payer: Self-pay

## 2016-11-28 ENCOUNTER — Telehealth: Payer: Self-pay | Admitting: Internal Medicine

## 2016-11-28 MED ORDER — LANTUS SOLOSTAR 100 UNIT/ML ~~LOC~~ SOPN: 34.0000 [IU] | PEN_INJECTOR | Freq: Every day | SUBCUTANEOUS | 3 refills | Status: AC

## 2016-11-28 NOTE — Telephone Encounter (Signed)
[place] Self [phone]  [facility] [address] [facility] [address] would like a prescription for her Lantis Pens Needles sent in to pharmacy

## 2016-11-28 NOTE — Telephone Encounter (Signed)
Submitted

## 2016-11-29 ENCOUNTER — Other Ambulatory Visit: Payer: Self-pay

## 2016-11-29 DIAGNOSIS — I10 Essential (primary) hypertension: Secondary | ICD-10-CM

## 2016-11-29 MED ORDER — AMLODIPINE BESYLATE 2.5 MG PO TABS: 2.5000 mg | ORAL_TABLET | Freq: Every day | ORAL | 1 refills | Status: DC

## 2016-12-14 ENCOUNTER — Telehealth: Payer: Self-pay | Admitting: Internal Medicine

## 2016-12-14 ENCOUNTER — Other Ambulatory Visit: Payer: Self-pay

## 2016-12-14 MED ORDER — INSULIN PEN NEEDLE 32G X 4 MM MISC: 11 refills | Status: AC

## 2016-12-14 MED ORDER — GLUCOSE BLOOD VI STRP: ORAL_STRIP | 5 refills | Status: DC

## 2016-12-14 MED ORDER — ONETOUCH VERIO W/DEVICE KIT: 1.0000 | PACK | Freq: Every day | 0 refills | Status: DC

## 2016-12-14 NOTE — Telephone Encounter (Signed)
Called patient back regarding her needing a new meter. I have submitted into the pharmacy, and also sent in needles.

## 2016-12-14 NOTE — Telephone Encounter (Signed)
Submitted

## 2016-12-14 NOTE — Telephone Encounter (Signed)
Patient is requesting lantus needles [facility] [address]  Also,  She states that she is in need of a new glucometer. Please advise her of how to go about this.

## 2016-12-15 ENCOUNTER — Other Ambulatory Visit: Payer: Self-pay | Admitting: Internal Medicine

## 2016-12-18 ENCOUNTER — Telehealth: Payer: Self-pay | Admitting: Internal Medicine

## 2016-12-18 ENCOUNTER — Telehealth: Payer: Self-pay

## 2016-12-18 ENCOUNTER — Other Ambulatory Visit: Payer: Self-pay

## 2016-12-18 MED ORDER — GLUCOSE BLOOD VI STRP: ORAL_STRIP | 5 refills | Status: AC

## 2016-12-18 MED ORDER — ACCU-CHEK MULTICLIX LANCET DEV KIT: PACK | 0 refills | Status: DC

## 2016-12-18 MED ORDER — GLUCOSE BLOOD VI STRP: ORAL_STRIP | 5 refills | Status: DC

## 2016-12-18 MED ORDER — ONETOUCH VERIO W/DEVICE KIT: 1.0000 | PACK | Freq: Every day | 0 refills | Status: DC

## 2016-12-18 NOTE — Telephone Encounter (Signed)
Advised patient

## 2016-12-18 NOTE — Telephone Encounter (Signed)
Patient is requesting that a prescription for nausea be called in [facility] [address]

## 2016-12-18 NOTE — Telephone Encounter (Signed)
Please advise. Thank you

## 2016-12-18 NOTE — Telephone Encounter (Signed)
Called and advised patient we did not prescribe the nausea medication. Patient understood and needed to switch her meter and strips to accu chek.

## 2016-12-18 NOTE — Telephone Encounter (Signed)
Per PCP 

## 2016-12-18 NOTE — Telephone Encounter (Signed)
Request for health information, Pharmacy says without new medicare card, meter and test strips ars not covered with out diagnosis. Please advise  [id number] [address], [place] - [address] [phone] (Phone) [phone] (Fax)

## 2016-12-18 NOTE — Telephone Encounter (Signed)
Submitted to pharmacy with DX codes on both BE'E

## 2016-12-31 LAB — HM DIABETES EYE EXAM

## 2017-01-13 ENCOUNTER — Other Ambulatory Visit: Payer: Self-pay | Admitting: Internal Medicine

## 2017-01-13 DIAGNOSIS — I10 Essential (primary) hypertension: Secondary | ICD-10-CM

## 2017-02-05 ENCOUNTER — Ambulatory Visit: Payer: Self-pay | Admitting: Internal Medicine

## 2017-02-07 ENCOUNTER — Encounter: Payer: Self-pay | Admitting: Internal Medicine

## 2017-02-07 ENCOUNTER — Ambulatory Visit (INDEPENDENT_AMBULATORY_CARE_PROVIDER_SITE_OTHER): Payer: Medicare Other | Admitting: Internal Medicine

## 2017-02-07 VITALS — BP 128/82 | HR 71 | Ht 65.0 in | Wt 180.0 lb

## 2017-02-07 DIAGNOSIS — Z794 Long term (current) use of insulin: Secondary | ICD-10-CM | POA: Diagnosis not present

## 2017-02-07 DIAGNOSIS — E1110 Type 2 diabetes mellitus with ketoacidosis without coma: Secondary | ICD-10-CM

## 2017-02-07 DIAGNOSIS — R202 Paresthesia of skin: Secondary | ICD-10-CM

## 2017-02-07 DIAGNOSIS — E119 Type 2 diabetes mellitus without complications: Secondary | ICD-10-CM

## 2017-02-07 LAB — LIPID PANEL
CHOLESTEROL: 159 mg/dL (ref 0–200)
HDL: 69.8 mg/dL (ref >39.00)
LDL CALC: 75 mg/dL (ref 0–99)
NonHDL: 89.39
TRIGLYCERIDES: 74 mg/dL (ref 0.0–149.0)
Total CHOL/HDL Ratio: 2
VLDL: 14.8 mg/dL (ref 0.0–40.0)

## 2017-02-07 LAB — POCT GLYCOSYLATED HEMOGLOBIN (HGB A1C): Hemoglobin A1C: 9.6

## 2017-02-07 LAB — VITAMIN B12: Vitamin B-12: >1500 pg/mL — ABNORMAL HIGH (ref 211–911)

## 2017-02-07 NOTE — Progress Notes (Signed)
Patient ID: [name], female   DOB: [birth year], 56 y.o.   MRN: [id number]  HPI: AAIMA [name] is a 56 y.o.-year-old female, returning for f/u for DM2, dx in 2001, previously GDM in 1996, insulin-dependent, uncontrolled, with complications (2x DKA episodes, last in 11/2013 - Glu 696, CO2 12). Last visit 5 months ago.   She stopped exercising >> now restarted water aerobics yesterday.  Last hemoglobin A1c was: Lab Results  Component Value Date   HGBA1C 8.8 09/14/2016   HGBA1C 9.3 02/27/2016   HGBA1C 10.3 (H) 11/16/2015  She gets steroid inj in knee q3-4 mo - last was last week.  She is on: - Metformin 1000 mg 2x a day with meals - Victoza 1.8 mg daily before b'fast. She lost 30 pounds after starting Victoza - Lantus 30 units at bedtime (she prev. had low blood sugars of 34 units) >> 24 units (decreased dose 1 mo ago) - Novolog 8-10 units before meals   We tried to use Bydureon 2 mg once a week >> not covered. Trulicity >> not covered. We had to stop Kombiglyze. Used to be on Novolog 70/30 b/c lost insurance, now back on Lantus.  Pt checks her sugars 3x: - am: 64-154, 195, 244 if eating at night >> 90-160s >> 200s - 2h after b'fast: n/c >> 46 (activity), 53, 111-228 >> n/c - before lunch: 90s, 200 >> n/c >> 70-126 >> 115-165 >> 90, 120-160 - 2h after lunch: n/c >> 40 x1, 104-176, 222 >> 70x1 >> n/c - before dinner: 150-160 >> n/c >> 200s >> 165 >> n/c >> 160-230 - 2h after dinner: n/c >> 103-172 >> n/c >> n/c - bedtime:100-200s >> 168, 195 >> 90-140 >> 190s, 200, 300s in last 3 weeks - nighttime: n/c >> 156, 172 >> n/c She has lows mostly at night or in am. Lowest sugar was  64 >> 30s x 1 >> she decreased insulin doses after this >> more recently 90 ; she has hypoglycemia awareness at 100.   Highest sugar was 244 >> 200s while off Victoza >> 300s   Glucometer: ReliOn   Saw nutrition 08/2004  - No history of CKD, last BUN/creatinine:  11/14/2015: 16/0.62 Lab  Results  Component Value Date   BUN 9 08/17/2015   CREATININE 0.60 08/17/2015  On losartan - last set of lipids: Lab Results  Component Value Date   CHOL 274 (H) 09/13/2016   HDL 97.20 09/13/2016   LDLCALC 149 (H) 09/13/2016   TRIG 143.0 09/13/2016   CHOLHDL 3 09/13/2016  Previously on Lipitor, then off because of palpitations. Started Livalo 08/2016. - last eye exam was in 12/2016: No DR - no numbness and tingling in her feet, but hand paresthesias. On 1000 mcg B12.  She had 2 back surgeries: 04/2013 and 07/14/2014.  She has H pylori infection (Dr. Percell Miller).   No h/o pancreatitis or Medullary ThyCa.  ROS: Constitutional: no weight gain/no weight loss, no fatigue, + subjective hyperthermia, no subjective hypothermia Eyes: no blurry vision, no xerophthalmia ENT: no sore throat, no nodules palpated in throat, no dysphagia, no odynophagia, no hoarseness Cardiovascular: no CP/no SOB/no palpitations/no leg swelling Respiratory: no cough/no SOB/no wheezing Gastrointestinal: + N/no V/no D/no C/no acid reflux Musculoskeletal: no muscle aches/no joint aches Skin: no rashes, no hair loss Neurological: no tremors/+ numbness/+ tingling/no dizziness  I reviewed pt's medications, allergies, PMH, social hx, family hx, and changes were documented in the history of present illness. Otherwise, unchanged from my initial visit  note. Past Medical History:  Diagnosis Date  . Anemia   . Anxiety   . Arthritis   . BV (bacterial vaginosis) 04/20/2015  . Constipation   . Depression   . Diabetes mellitus    h/o dka  . Fibromyalgia   . GERD (gastroesophageal reflux disease)    tums occ  . Heart murmur    child  . Hypertension   . Pelvic pain in female 04/20/2015  . Superficial fungus infection of skin 04/20/2015  . Vaginal discharge 04/20/2015   Past Surgical History:  Procedure Laterality Date  . COLONOSCOPY  02/26/2012   SLF: Internal hemorrhoids/TCS IN 10 YEARS WITH PROPOFOL  . CYSTECTOMY   96   stomach  . ENDOMETRIAL ABLATION  08  . KNEE SURGERY Right 99   screws  . LUMBAR LAMINECTOMY/DECOMPRESSION MICRODISCECTOMY N/A 05/14/2013   Procedure: RIGHT L4-L5 DECOMPRESSION AND CYST REMOVAL;  Surgeon: Melina Schools, MD;  Location: [facility];  Service: Orthopedics;  Laterality: N/A;  . LUMBAR PERCUTANEOUS PEDICLE SCREW 1 LEVEL N/A 07/14/2014   Procedure: REMOVAL OF L5-S1 PEDICLE SCREWS,FUSION L5-S1, REINSERTION OF PEDICLE SCREWS ILIAC CREST BONE GRAFT;  Surgeon: Melina Schools, MD;  Location: [facility];  Service: Orthopedics;  Laterality: N/A;  . SHOULDER SURGERY  10   right rotator cuff  . TRIGGER FINGER RELEASE     History   Social History  . Marital Status: Divorced    Spouse Name: N/A    Number of Children: 4   Occupational History  . [employer]   Social History Main Topics  . Smoking status: Former Smoker -- 0.50 packs/day for 15 years    Types: Cigarettes    Quit date: 04/29/1991  . Smokeless tobacco: Never Used  . Alcohol Use: 0.6 oz/week    1 Glasses of wine per week     Comment: occ wine; not now  . Drug Use: No     Comment: crack/cocaine 20 yrs ago   Current Outpatient Prescriptions on File Prior to Visit  Medication Sig Dispense Refill  . amLODipine (NORVASC) 2.5 MG tablet Take 1 tablet (2.5 mg total) by mouth daily. 90 tablet 1  . aspirin EC 81 MG tablet Take 81 mg by mouth daily.     Marland Kitchen azelastine (ASTELIN) 0.1 % nasal spray Place 2 sprays into both nostrils daily as needed for allergies.    . fexofenadine (ALLEGRA) 180 MG tablet Take 180 mg by mouth daily.     . fluconazole (DIFLUCAN) 150 MG tablet take 1 tablet by mouth immediately and repeat in 3 days 2 tablet 1  . glucose blood (ACCU-CHEK AVIVA) test strip Use as instructed to check sugar 4 times daily 400 each 5  . insulin aspart (NOVOLOG FLEXPEN) 100 UNIT/ML FlexPen Inject 12-15 Units into the skin 3 (three) times daily with meals. 45 mL 3  . Insulin Pen Needle (BD PEN NEEDLE NANO U/F) 32G X 4 MM MISC Use 4x a  day 200 each 11  . Lancets Misc. (ACCU-CHEK MULTICLIX LANCET DEV) KIT Use to check sugar 4 times daily 400 each 0  . LANTUS SOLOSTAR 100 UNIT/ML Solostar Pen Inject 34 Units into the skin at bedtime. 30 mL 3  . losartan-hydrochlorothiazide (HYZAAR) 100-12.5 MG tablet TAKE 1 TABLET BY MOUTH DAILY. 30 tablet 3  . meloxicam (MOBIC) 15 MG tablet Take 1 tablet (15 mg total) by mouth daily. 15 tablet 0  . metFORMIN (GLUCOPHAGE-XR) 500 MG 24 hr tablet TAKE 2 TABLETS TWICE A DAY WITH FOOD 120 tablet  2  . mometasone (NASONEX) 50 MCG/ACT nasal spray Place 2 sprays into the nose daily. 17 g 12  . nystatin-triamcinolone (MYCOLOG II) cream Apply 1 application topically 2 (two) times daily. (Patient taking differently: Apply 1 application topically daily as needed (rash). ) 30 g 0  . Pitavastatin Calcium 4 MG TABS Take once daily by mouth 90 tablet 3  . traZODone (DESYREL) 100 MG tablet Take 100 mg by mouth at bedtime as needed for sleep.     Marland Kitchen VICTOZA 18 MG/3ML SOPN INJECT 1.8MG SUB Q EVERY MORNING 9 pen 1  . gabapentin (NEURONTIN) 300 MG capsule Take 2 capsules (600 mg total) by mouth 2 (two) times daily. (Patient not taking: Reported on 09/13/2016) 90 capsule 0   No current facility-administered medications on file prior to visit.    No Known Allergies Family History  Problem Relation Age of Onset  . Hypertension Mother   . Cancer Mother   . Asthma Sister   . Asthma Brother   . Stomach cancer Unknown        distant relative on dad's side of family  . Diabetes Paternal Grandmother   . Colon cancer Neg Hx    PE: BP 128/82 (BP Location: Left Arm, Patient Position: Sitting)   Pulse 71   Ht _0  (1.651 m)   Wt 180 lb (81.6 kg)   SpO2 97%   BMI 29.95 kg/m  Body mass index is 29.95 kg/m. Wt Readings from Last 3 Encounters:  02/07/17 180 lb (81.6 kg)  09/13/16 176 lb (79.8 kg)  04/10/16 183 lb (83 kg)   Constitutional: overweight, in NAD Eyes: PERRLA, EOMI, no exophthalmos ENT: moist mucous  membranes, no thyromegaly, no cervical lymphadenopathy Cardiovascular: RRR, No MRG Respiratory: CTA B Gastrointestinal: abdomen soft, NT, ND, BS+ Musculoskeletal: no deformities, strength intact in all 4 Skin: moist, warm, no rashes Neurological: no tremor with outstretched hands, DTR normal in all 4  ASSESSMENT: 1. 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 HL  3. Hand paresthesias  PLAN:  1. Patient with long-standing, uncontrolled DM, now with worse control after she stopped exercising and after she decreased the dose of Lantus. Se had hypoglycemia at night, but this may have been 2/2 higher Novolog with dinner >> will decrease this. Will continue current Novolog dose with b'fast but increase the dse with lunch as sugars before dinner are higher. Will also increase the dose of Lantus and may need to move this in am if she starts having hypoglycemia at night again. - she is not using the SSI >> explained how to use it - I advised her to: Patient Instructions  Please continue: - Metformin 1000 mg 2x a day - Victoza 1.8 mg daily before b'fast.  Please increase: - Lantus 28 units at bedtime  Please change: - Novolog: B'fast: 8 units Lunch: 10 units Dinner: 8 units if you do not have dessert  10 units if going out or if you have dessert  Please add: - Novolog Sliding scale: - 150-175: + 1 unit  - 176-200: + 2 units  - 201-225: + 3 units  - 226-250: + 4 units  - >250: + 5 units  Please return in 3 months with your sugar log.   - today, HbA1c is 9.6% (higher) - continue checking sugars at different times of the day - check 3x a day, rotating checks - advised for yearly eye exams >> she is UTD -  Return to clinic in 3 mo with sugar log   2. HL - Reviewed previous lipid levels, including the ones at last visit - She previously stopped Lipitor because she thought this was giving her palpitations >> off  for a year before last check - at last visit, I suggested to restart a statin: Livalo >> she takes this. - will check Lipids  3. Hand paresthesias - on a B12 supplement 1000 mcg daily - check level  Component     Latest Ref Rng & Units 02/07/2017          Sodium     135 - 146 mmol/L 138  Potassium     3.5 - 5.3 mmol/L 4.2  Chloride     98 - 110 mmol/L 100  CO2     20 - 31 mmol/L 20  Glucose     65 - 99 mg/dL 315 (H)  BUN     7 - 25 mg/dL 15  Creatinine     0.50 - 1.05 mg/dL 0.73  Calcium     8.6 - 10.4 mg/dL 9.0  Total Protein     6.1 - 8.1 g/dL 6.0 (L)  Albumin     3.6 - 5.1 g/dL 3.6  AST     10 - 35 U/L 11  ALT     6 - 29 U/L 13  Alkaline Phosphatase     33 - 130 U/L 73  Total Bilirubin     0.2 - 1.2 mg/dL 0.3  GFR, Est Non African American     >=60 mL/min >89  GFR, Est African American     >=60 mL/min >89  Cholesterol     0 - 200 mg/dL 159  Triglycerides     0.0 - 149.0 mg/dL 74.0  HDL Cholesterol     >39.00 mg/dL 69.80  VLDL     0.0 - 40.0 mg/dL 14.8  LDL (calc)     0 - 99 mg/dL 75  Total CHOL/HDL Ratio      2  NonHDL      89.39  Hemoglobin A1C      9.6   Glu and hbA1c high. Lipids at goal.  Lab Results  Component Value Date   VITAMINB12 >1500 (H) 02/07/2017   B12 high, c/w supplementation.  Philemon Kingdom, MD PhD [facility]

## 2017-02-07 NOTE — Patient Instructions (Addendum)
Please continue: - Metformin 1000 mg 2x a day - Victoza 1.8 mg daily before b'fast.  Please increase: - Lantus 28 units at bedtime  Please change: - Novolog: B'fast: 8 units Lunch: 10 units Dinner: 8 units if you do not have dessert  10 units if going out or if you have dessert  Please add: - Novolog Sliding scale: - 150-175: + 1 unit  - 176-200: + 2 units  - 201-225: + 3 units  - 226-250: + 4 units  - >250: + 5 units  Please return in 3 months with your sugar log.

## 2017-02-08 ENCOUNTER — Telehealth: Payer: Self-pay

## 2017-02-08 LAB — COMPLETE METABOLIC PANEL WITH GFR
ALT: 13 U/L (ref 6–29)
AST: 11 U/L (ref 10–35)
Albumin: 3.6 g/dL (ref 3.6–5.1)
Alkaline Phosphatase: 73 U/L (ref 33–130)
BUN: 15 mg/dL (ref 7–25)
CALCIUM: 9 mg/dL (ref 8.6–10.4)
CHLORIDE: 100 mmol/L (ref 98–110)
CO2: 20 mmol/L (ref 20–31)
Creat: 0.73 mg/dL (ref 0.50–1.05)
GFR, Est African American: >89 mL/min (ref >=60)
Glucose, Bld: 315 mg/dL — ABNORMAL HIGH (ref 65–99)
Potassium: 4.2 mmol/L (ref 3.5–5.3)
Sodium: 138 mmol/L (ref 135–146)
Total Bilirubin: 0.3 mg/dL (ref 0.2–1.2)
Total Protein: 6 g/dL — ABNORMAL LOW (ref 6.1–8.1)

## 2017-02-08 NOTE — Telephone Encounter (Signed)
-----   Message from Philemon Kingdom, MD sent at 02/08/2017 12:15 PM EDT ----- Almyra Free, can you please call pt: Glucose high. Kidney fxn normal. Lipids at goal. B12 not low.

## 2017-02-08 NOTE — Telephone Encounter (Signed)
Called patient and gave lab results. Patient had no questions or concerns.  

## 2017-02-14 ENCOUNTER — Other Ambulatory Visit: Payer: Self-pay

## 2017-02-14 DIAGNOSIS — I10 Essential (primary) hypertension: Secondary | ICD-10-CM

## 2017-02-14 MED ORDER — LOSARTAN POTASSIUM-HCTZ 100-12.5 MG PO TABS: 1.0000 | ORAL_TABLET | Freq: Every day | ORAL | 3 refills | Status: DC

## 2017-02-17 ENCOUNTER — Other Ambulatory Visit: Payer: Self-pay | Admitting: Internal Medicine

## 2017-02-25 ENCOUNTER — Other Ambulatory Visit: Payer: Self-pay

## 2017-02-25 MED ORDER — ACCU-CHEK FASTCLIX LANCETS MISC: 5 refills | Status: DC

## 2017-02-26 ENCOUNTER — Other Ambulatory Visit: Payer: Self-pay

## 2017-02-26 MED ORDER — ACCU-CHEK FASTCLIX LANCETS MISC: 5 refills | Status: AC

## 2017-03-27 ENCOUNTER — Other Ambulatory Visit: Payer: Self-pay

## 2017-03-27 MED ORDER — INSULIN LISPRO 100 UNIT/ML (KWIKPEN): PEN_INJECTOR | SUBCUTANEOUS | 4 refills | Status: DC

## 2017-03-28 ENCOUNTER — Other Ambulatory Visit: Payer: Self-pay

## 2017-03-28 MED ORDER — INSULIN ASPART 100 UNIT/ML FLEXPEN: 12.0000 [IU] | PEN_INJECTOR | Freq: Three times a day (TID) | SUBCUTANEOUS | 3 refills | Status: DC

## 2017-04-04 ENCOUNTER — Telehealth: Payer: Self-pay | Admitting: Internal Medicine

## 2017-04-04 NOTE — Telephone Encounter (Signed)
Patient needs documentation sent to [facility] on [address] [place] that she is taking novalog instead of humalog. She states that her insurance keeps changing it to humalog. Patient does not want to switch to humalog.

## 2017-04-05 NOTE — Telephone Encounter (Signed)
I am assuming they need a PA to stay on the Novolog, okay to complete??

## 2017-04-08 ENCOUNTER — Telehealth: Payer: Self-pay

## 2017-04-08 NOTE — Telephone Encounter (Signed)
Patient would like to try a PA, so I will work on this.

## 2017-04-08 NOTE — Telephone Encounter (Signed)
Ok, but do you know why she does not want Humalog, they are interchangeable. We need a good reason for the PA to go through.Marland KitchenMarland Kitchen

## 2017-04-08 NOTE — Telephone Encounter (Signed)
Called patient to advise if she wanted to complete a PA for the Novolog, patient stated yes. PA form was submitted by [employer] today.  Will await response and will notify patient after.

## 2017-04-10 ENCOUNTER — Other Ambulatory Visit: Payer: Self-pay | Admitting: Internal Medicine

## 2017-04-30 ENCOUNTER — Other Ambulatory Visit: Payer: Self-pay | Admitting: Internal Medicine

## 2017-05-08 ENCOUNTER — Other Ambulatory Visit: Payer: Self-pay

## 2017-05-08 MED ORDER — INSULIN LISPRO 100 UNIT/ML (KWIKPEN): PEN_INJECTOR | SUBCUTANEOUS | 4 refills | Status: AC

## 2017-05-10 ENCOUNTER — Ambulatory Visit: Payer: Self-pay | Admitting: Internal Medicine

## 2017-06-17 ENCOUNTER — Other Ambulatory Visit: Payer: Self-pay

## 2017-06-17 DIAGNOSIS — I10 Essential (primary) hypertension: Secondary | ICD-10-CM

## 2017-06-17 MED ORDER — LOSARTAN POTASSIUM-HCTZ 100-12.5 MG PO TABS: 1.0000 | ORAL_TABLET | Freq: Every day | ORAL | 3 refills | Status: AC

## 2017-06-18 ENCOUNTER — Other Ambulatory Visit: Payer: Self-pay | Admitting: Internal Medicine

## 2017-06-18 DIAGNOSIS — I10 Essential (primary) hypertension: Secondary | ICD-10-CM

## 2017-07-01 ENCOUNTER — Other Ambulatory Visit: Payer: Self-pay | Admitting: Internal Medicine

## 2017-09-03 ENCOUNTER — Other Ambulatory Visit: Payer: Self-pay | Admitting: Internal Medicine

## 2017-09-11 ENCOUNTER — Telehealth: Payer: Self-pay | Admitting: Internal Medicine

## 2017-09-11 NOTE — Telephone Encounter (Signed)
[employer] calling to check status of medical record paper that they faxed over on the 18.    [phone] [facility]

## 2017-09-12 NOTE — Telephone Encounter (Signed)
Called and had to LVM all medial record requests get sent over to the MR department, phone number left for them to call

## 2017-09-18 ENCOUNTER — Other Ambulatory Visit: Payer: Self-pay | Admitting: Internal Medicine

## 2017-09-18 DIAGNOSIS — I10 Essential (primary) hypertension: Secondary | ICD-10-CM

## 2017-09-18 NOTE — Telephone Encounter (Signed)
Per PCP 

## 2017-09-18 NOTE — Telephone Encounter (Signed)
Is this okay to refill? 

## 2017-09-19 ENCOUNTER — Other Ambulatory Visit: Payer: Self-pay | Admitting: Internal Medicine

## 2017-09-19 DIAGNOSIS — I10 Essential (primary) hypertension: Secondary | ICD-10-CM

## 2017-10-03 ENCOUNTER — Other Ambulatory Visit: Payer: Self-pay | Admitting: Internal Medicine

## 2017-10-03 DIAGNOSIS — I10 Essential (primary) hypertension: Secondary | ICD-10-CM

## 2017-10-07 ENCOUNTER — Other Ambulatory Visit: Payer: Self-pay

## 2017-10-07 MED ORDER — METFORMIN HCL ER 500 MG PO TB24: ORAL_TABLET | ORAL | 2 refills | Status: DC

## 2017-10-08 ENCOUNTER — Other Ambulatory Visit: Payer: Self-pay

## 2017-10-08 MED ORDER — METFORMIN HCL ER 500 MG PO TB24: ORAL_TABLET | ORAL | 0 refills | Status: DC

## 2017-12-04 ENCOUNTER — Other Ambulatory Visit: Payer: Self-pay | Admitting: Internal Medicine

## 2017-12-11 ENCOUNTER — Other Ambulatory Visit: Payer: Self-pay | Admitting: Internal Medicine

## 2018-01-02 ENCOUNTER — Other Ambulatory Visit: Payer: Self-pay | Admitting: Internal Medicine

## 2018-01-02 DIAGNOSIS — I10 Essential (primary) hypertension: Secondary | ICD-10-CM

## 2018-01-02 NOTE — Telephone Encounter (Signed)
I have not seen her in almost a year.  Let us refill this, but she needs to schedule an appointment by the end of the summer.

## 2018-01-02 NOTE — Telephone Encounter (Signed)
Is this okay to refill? 

## 2018-01-08 ENCOUNTER — Other Ambulatory Visit: Payer: Self-pay | Admitting: Internal Medicine

## 2018-01-08 NOTE — Telephone Encounter (Signed)
Pt was told on 12/12/16 and today 01/08/18 she will have to have an appt for further refills. Last seen 01/2017 and No Showed/Canceled her last appointment. Have been unable to reach by phone but was left on voicemail.

## 2018-02-08 ENCOUNTER — Other Ambulatory Visit: Payer: Self-pay | Admitting: Internal Medicine

## 2018-04-02 ENCOUNTER — Other Ambulatory Visit: Payer: Self-pay | Admitting: Internal Medicine

## 2018-04-02 DIAGNOSIS — I10 Essential (primary) hypertension: Secondary | ICD-10-CM

## 2018-05-05 ENCOUNTER — Other Ambulatory Visit: Payer: Self-pay | Admitting: Internal Medicine

## 2018-05-05 NOTE — Telephone Encounter (Signed)
Last ov 02/07/17 refill or refuse please advise

## 2018-05-05 NOTE — Telephone Encounter (Signed)
Ok

## 2018-09-06 ENCOUNTER — Other Ambulatory Visit: Payer: Self-pay | Admitting: Internal Medicine

## 2018-09-08 NOTE — Telephone Encounter (Signed)
No, she was lost for f/u with me

## 2018-09-08 NOTE — Telephone Encounter (Signed)
Last OV 02/07/17  No appointment scheduled.  Please advise.

## 2021-01-25 DIAGNOSIS — I10 Essential (primary) hypertension: Secondary | ICD-10-CM | POA: Diagnosis not present

## 2021-01-25 DIAGNOSIS — E782 Mixed hyperlipidemia: Secondary | ICD-10-CM | POA: Diagnosis not present

## 2021-01-25 DIAGNOSIS — M48061 Spinal stenosis, lumbar region without neurogenic claudication: Secondary | ICD-10-CM | POA: Diagnosis not present

## 2021-01-25 DIAGNOSIS — E1140 Type 2 diabetes mellitus with diabetic neuropathy, unspecified: Secondary | ICD-10-CM | POA: Diagnosis not present

## 2021-01-25 DIAGNOSIS — E109 Type 1 diabetes mellitus without complications: Secondary | ICD-10-CM | POA: Diagnosis not present

## 2021-01-25 DIAGNOSIS — M5416 Radiculopathy, lumbar region: Secondary | ICD-10-CM | POA: Diagnosis not present

## 2021-01-25 DIAGNOSIS — Z79899 Other long term (current) drug therapy: Secondary | ICD-10-CM | POA: Diagnosis not present

## 2021-01-29 DIAGNOSIS — Z79899 Other long term (current) drug therapy: Secondary | ICD-10-CM | POA: Diagnosis not present

## 2021-02-08 DIAGNOSIS — Z794 Long term (current) use of insulin: Secondary | ICD-10-CM | POA: Diagnosis not present

## 2021-02-08 DIAGNOSIS — E1065 Type 1 diabetes mellitus with hyperglycemia: Secondary | ICD-10-CM | POA: Diagnosis not present

## 2021-02-27 DIAGNOSIS — E1140 Type 2 diabetes mellitus with diabetic neuropathy, unspecified: Secondary | ICD-10-CM | POA: Diagnosis not present

## 2021-02-27 DIAGNOSIS — E782 Mixed hyperlipidemia: Secondary | ICD-10-CM | POA: Diagnosis not present

## 2021-02-27 DIAGNOSIS — I10 Essential (primary) hypertension: Secondary | ICD-10-CM | POA: Diagnosis not present

## 2021-02-27 DIAGNOSIS — E109 Type 1 diabetes mellitus without complications: Secondary | ICD-10-CM | POA: Diagnosis not present

## 2021-02-27 DIAGNOSIS — Z79899 Other long term (current) drug therapy: Secondary | ICD-10-CM | POA: Diagnosis not present

## 2021-02-27 DIAGNOSIS — M48061 Spinal stenosis, lumbar region without neurogenic claudication: Secondary | ICD-10-CM | POA: Diagnosis not present

## 2021-02-27 DIAGNOSIS — M5416 Radiculopathy, lumbar region: Secondary | ICD-10-CM | POA: Diagnosis not present

## 2021-03-01 DIAGNOSIS — Z79899 Other long term (current) drug therapy: Secondary | ICD-10-CM | POA: Diagnosis not present

## 2021-03-12 DIAGNOSIS — Y999 Unspecified external cause status: Secondary | ICD-10-CM | POA: Diagnosis not present

## 2021-03-12 DIAGNOSIS — X58XXXA Exposure to other specified factors, initial encounter: Secondary | ICD-10-CM | POA: Diagnosis not present

## 2021-03-12 DIAGNOSIS — R0602 Shortness of breath: Secondary | ICD-10-CM | POA: Diagnosis not present

## 2021-03-12 DIAGNOSIS — R059 Cough, unspecified: Secondary | ICD-10-CM | POA: Diagnosis not present

## 2021-03-12 DIAGNOSIS — T189XXA Foreign body of alimentary tract, part unspecified, initial encounter: Secondary | ICD-10-CM | POA: Diagnosis not present

## 2021-03-22 DIAGNOSIS — I10 Essential (primary) hypertension: Secondary | ICD-10-CM | POA: Diagnosis not present

## 2021-03-22 DIAGNOSIS — E1140 Type 2 diabetes mellitus with diabetic neuropathy, unspecified: Secondary | ICD-10-CM | POA: Diagnosis not present

## 2021-03-22 DIAGNOSIS — E782 Mixed hyperlipidemia: Secondary | ICD-10-CM | POA: Diagnosis not present

## 2021-03-22 DIAGNOSIS — M48061 Spinal stenosis, lumbar region without neurogenic claudication: Secondary | ICD-10-CM | POA: Diagnosis not present

## 2021-03-22 DIAGNOSIS — E109 Type 1 diabetes mellitus without complications: Secondary | ICD-10-CM | POA: Diagnosis not present

## 2021-03-22 DIAGNOSIS — Z79899 Other long term (current) drug therapy: Secondary | ICD-10-CM | POA: Diagnosis not present

## 2021-03-22 DIAGNOSIS — M5416 Radiculopathy, lumbar region: Secondary | ICD-10-CM | POA: Diagnosis not present

## 2022-01-30 ENCOUNTER — Encounter: Payer: Self-pay | Admitting: *Deleted
